# Patient Record
Sex: Male | Born: 1935 | Race: Asian | Hispanic: No | Smoking: Former smoker
Health system: Southern US, Community
[De-identification: ages and names within clinical notes are randomized; demographics above are authoritative.]

## PROBLEM LIST (undated history)

## (undated) ENCOUNTER — Emergency Department (HOSPITAL_COMMUNITY): Admission: EM | Payer: PRIVATE HEALTH INSURANCE | Source: Home / Self Care

## (undated) DIAGNOSIS — J449 Chronic obstructive pulmonary disease, unspecified: Secondary | ICD-10-CM

## (undated) DIAGNOSIS — E1140 Type 2 diabetes mellitus with diabetic neuropathy, unspecified: Secondary | ICD-10-CM

## (undated) DIAGNOSIS — I10 Essential (primary) hypertension: Secondary | ICD-10-CM

## (undated) DIAGNOSIS — N400 Enlarged prostate without lower urinary tract symptoms: Secondary | ICD-10-CM

## (undated) DIAGNOSIS — N3080 Other cystitis without hematuria: Secondary | ICD-10-CM

## (undated) DIAGNOSIS — E785 Hyperlipidemia, unspecified: Secondary | ICD-10-CM

## (undated) DIAGNOSIS — R0601 Orthopnea: Secondary | ICD-10-CM

## (undated) HISTORY — PX: NO PAST SURGERIES: SHX2092

## (undated) HISTORY — DX: Hyperlipidemia, unspecified: E78.5

## (undated) HISTORY — DX: Essential (primary) hypertension: I10

---

## 2006-08-06 ENCOUNTER — Inpatient Hospital Stay (HOSPITAL_COMMUNITY): Admission: EM | Admit: 2006-08-06 | Discharge: 2006-08-12 | Payer: Self-pay | Admitting: Emergency Medicine

## 2006-08-11 ENCOUNTER — Encounter: Payer: Self-pay | Admitting: *Deleted

## 2007-10-18 ENCOUNTER — Emergency Department (HOSPITAL_COMMUNITY): Admission: EM | Admit: 2007-10-18 | Discharge: 2007-10-18 | Payer: Self-pay | Admitting: Emergency Medicine

## 2007-10-20 ENCOUNTER — Ambulatory Visit: Payer: Self-pay | Admitting: Internal Medicine

## 2007-10-20 ENCOUNTER — Inpatient Hospital Stay (HOSPITAL_COMMUNITY): Admission: EM | Admit: 2007-10-20 | Discharge: 2007-10-22 | Payer: Self-pay | Admitting: Emergency Medicine

## 2007-10-21 LAB — CONVERTED CEMR LAB
HDL: 43 mg/dL
PSA: 0.72 ng/mL

## 2007-10-29 ENCOUNTER — Observation Stay (HOSPITAL_COMMUNITY): Admission: EM | Admit: 2007-10-29 | Discharge: 2007-10-30 | Payer: Self-pay | Admitting: Emergency Medicine

## 2007-10-29 ENCOUNTER — Ambulatory Visit: Payer: Self-pay | Admitting: Family Medicine

## 2007-11-01 ENCOUNTER — Ambulatory Visit: Payer: Self-pay | Admitting: Family Medicine

## 2007-11-01 ENCOUNTER — Encounter (INDEPENDENT_AMBULATORY_CARE_PROVIDER_SITE_OTHER): Payer: Self-pay | Admitting: *Deleted

## 2007-11-02 ENCOUNTER — Ambulatory Visit: Payer: Self-pay | Admitting: Family Medicine

## 2007-11-09 ENCOUNTER — Ambulatory Visit: Payer: Self-pay | Admitting: Family Medicine

## 2007-11-09 DIAGNOSIS — J4489 Other specified chronic obstructive pulmonary disease: Secondary | ICD-10-CM | POA: Insufficient documentation

## 2007-11-09 DIAGNOSIS — E0590 Thyrotoxicosis, unspecified without thyrotoxic crisis or storm: Secondary | ICD-10-CM | POA: Insufficient documentation

## 2007-11-09 DIAGNOSIS — G4700 Insomnia, unspecified: Secondary | ICD-10-CM | POA: Insufficient documentation

## 2007-11-09 DIAGNOSIS — J449 Chronic obstructive pulmonary disease, unspecified: Secondary | ICD-10-CM

## 2007-11-12 ENCOUNTER — Encounter: Payer: Self-pay | Admitting: Family Medicine

## 2007-11-12 ENCOUNTER — Observation Stay (HOSPITAL_COMMUNITY): Admission: EM | Admit: 2007-11-12 | Discharge: 2007-11-13 | Payer: Self-pay | Admitting: Emergency Medicine

## 2007-11-12 ENCOUNTER — Ambulatory Visit: Payer: Self-pay | Admitting: Family Medicine

## 2007-11-13 ENCOUNTER — Encounter (INDEPENDENT_AMBULATORY_CARE_PROVIDER_SITE_OTHER): Payer: Self-pay | Admitting: Family Medicine

## 2007-12-08 ENCOUNTER — Ambulatory Visit: Payer: Self-pay | Admitting: Family Medicine

## 2007-12-08 ENCOUNTER — Encounter (INDEPENDENT_AMBULATORY_CARE_PROVIDER_SITE_OTHER): Payer: Self-pay | Admitting: Family Medicine

## 2007-12-08 LAB — CONVERTED CEMR LAB

## 2007-12-14 ENCOUNTER — Telehealth (INDEPENDENT_AMBULATORY_CARE_PROVIDER_SITE_OTHER): Payer: Self-pay | Admitting: Family Medicine

## 2007-12-14 DIAGNOSIS — E119 Type 2 diabetes mellitus without complications: Secondary | ICD-10-CM

## 2007-12-14 LAB — CONVERTED CEMR LAB
CO2: 24 meq/L (ref 19–32)
Calcium: 8.3 mg/dL — ABNORMAL LOW (ref 8.4–10.5)
Creatinine, Ser: 0.62 mg/dL (ref 0.40–1.50)
Free T4: 1.69 ng/dL (ref 0.89–1.80)
Glucose, Bld: 181 mg/dL — ABNORMAL HIGH (ref 70–99)
TSH: 0.389 microintl units/mL (ref 0.350–5.50)
Total Bilirubin: 0.9 mg/dL (ref 0.3–1.2)

## 2008-01-10 ENCOUNTER — Encounter (INDEPENDENT_AMBULATORY_CARE_PROVIDER_SITE_OTHER): Payer: Self-pay | Admitting: Family Medicine

## 2008-01-10 ENCOUNTER — Ambulatory Visit: Payer: Self-pay | Admitting: Family Medicine

## 2008-01-10 DIAGNOSIS — I10 Essential (primary) hypertension: Secondary | ICD-10-CM

## 2008-01-10 LAB — CONVERTED CEMR LAB: Glucose, Bld: 230 mg/dL

## 2008-01-11 LAB — CONVERTED CEMR LAB
AST: 16 units/L (ref 0–37)
Albumin: 4.1 g/dL (ref 3.5–5.2)
Glucose, Bld: 179 mg/dL — ABNORMAL HIGH (ref 70–99)
Total Bilirubin: 0.3 mg/dL (ref 0.3–1.2)
Total Protein: 6.8 g/dL (ref 6.0–8.3)

## 2008-02-14 ENCOUNTER — Encounter: Admission: RE | Admit: 2008-02-14 | Discharge: 2008-05-14 | Payer: Self-pay | Admitting: Family Medicine

## 2008-02-22 ENCOUNTER — Encounter (INDEPENDENT_AMBULATORY_CARE_PROVIDER_SITE_OTHER): Payer: Self-pay | Admitting: Family Medicine

## 2008-02-28 ENCOUNTER — Encounter: Payer: Self-pay | Admitting: Family Medicine

## 2008-02-28 ENCOUNTER — Ambulatory Visit: Payer: Self-pay | Admitting: Family Medicine

## 2008-02-28 ENCOUNTER — Inpatient Hospital Stay (HOSPITAL_COMMUNITY): Admission: EM | Admit: 2008-02-28 | Discharge: 2008-03-01 | Payer: Self-pay | Admitting: Emergency Medicine

## 2008-03-07 ENCOUNTER — Encounter: Payer: Self-pay | Admitting: *Deleted

## 2008-03-07 ENCOUNTER — Ambulatory Visit: Payer: Self-pay | Admitting: Family Medicine

## 2008-09-20 ENCOUNTER — Telehealth: Payer: Self-pay | Admitting: *Deleted

## 2008-10-02 ENCOUNTER — Ambulatory Visit: Payer: Self-pay | Admitting: Family Medicine

## 2008-10-02 ENCOUNTER — Encounter (INDEPENDENT_AMBULATORY_CARE_PROVIDER_SITE_OTHER): Payer: Self-pay | Admitting: Family Medicine

## 2008-10-02 DIAGNOSIS — J984 Other disorders of lung: Secondary | ICD-10-CM | POA: Insufficient documentation

## 2008-10-04 ENCOUNTER — Encounter (INDEPENDENT_AMBULATORY_CARE_PROVIDER_SITE_OTHER): Payer: Self-pay | Admitting: Family Medicine

## 2008-10-04 LAB — CONVERTED CEMR LAB
ALT: 13 U/L
AST: 16 U/L
Albumin: 4.2 g/dL
Alkaline Phosphatase: 65 U/L
BUN: 24 mg/dL — ABNORMAL HIGH
CO2: 26 meq/L
Calcium: 9 mg/dL
Chloride: 103 meq/L
Creatinine, Ser: 0.87 mg/dL
Direct LDL: 92 mg/dL
Glucose, Bld: 94 mg/dL
Potassium: 4.8 meq/L
Sodium: 140 meq/L
Total Bilirubin: 0.3 mg/dL
Total Protein: 7.8 g/dL

## 2008-10-20 LAB — CONVERTED CEMR LAB: PSA: 0.72 ng/mL

## 2008-10-25 ENCOUNTER — Encounter: Admission: RE | Admit: 2008-10-25 | Discharge: 2008-10-25 | Payer: Self-pay | Admitting: Family Medicine

## 2008-11-27 ENCOUNTER — Encounter (INDEPENDENT_AMBULATORY_CARE_PROVIDER_SITE_OTHER): Payer: Self-pay | Admitting: Family Medicine

## 2009-03-05 ENCOUNTER — Emergency Department (HOSPITAL_COMMUNITY): Admission: EM | Admit: 2009-03-05 | Discharge: 2009-03-05 | Payer: Self-pay | Admitting: Emergency Medicine

## 2009-03-11 ENCOUNTER — Emergency Department (HOSPITAL_COMMUNITY): Admission: EM | Admit: 2009-03-11 | Discharge: 2009-03-12 | Payer: Self-pay | Admitting: Emergency Medicine

## 2009-03-14 ENCOUNTER — Emergency Department (HOSPITAL_COMMUNITY): Admission: EM | Admit: 2009-03-14 | Discharge: 2009-03-14 | Payer: Self-pay | Admitting: Emergency Medicine

## 2009-04-07 ENCOUNTER — Encounter: Payer: Self-pay | Admitting: Family Medicine

## 2009-04-07 ENCOUNTER — Ambulatory Visit: Payer: Self-pay | Admitting: Family Medicine

## 2009-04-07 ENCOUNTER — Inpatient Hospital Stay (HOSPITAL_COMMUNITY): Admission: EM | Admit: 2009-04-07 | Discharge: 2009-04-09 | Payer: Self-pay | Admitting: Emergency Medicine

## 2009-04-07 ENCOUNTER — Ambulatory Visit: Payer: Self-pay | Admitting: Vascular Surgery

## 2009-04-08 ENCOUNTER — Encounter: Payer: Self-pay | Admitting: Family Medicine

## 2009-04-09 LAB — CONVERTED CEMR LAB
HCT: 35.5 %
Hemoglobin: 11.7 g/dL
Platelets: 185 10*3/uL
WBC: 17.8 10*3/uL

## 2009-04-18 ENCOUNTER — Ambulatory Visit: Payer: Self-pay | Admitting: Family Medicine

## 2009-04-18 ENCOUNTER — Encounter: Payer: Self-pay | Admitting: Family Medicine

## 2009-04-18 LAB — CONVERTED CEMR LAB
BUN: 13 mg/dL
CO2: 20 meq/L
CO2: 24 meq/L (ref 19–32)
Calcium: 7.8 mg/dL
Calcium: 8.3 mg/dL — ABNORMAL LOW (ref 8.4–10.5)
Chloride: 106 meq/L
Chloride: 105 meq/L (ref 96–112)
Creatinine, Ser: 0.79 mg/dL
Glucose, Bld: 187 mg/dL
Sodium: 140 meq/L (ref 135–145)

## 2009-04-30 ENCOUNTER — Ambulatory Visit: Payer: Self-pay | Admitting: Family Medicine

## 2009-05-27 ENCOUNTER — Encounter: Payer: Self-pay | Admitting: Family Medicine

## 2009-05-27 ENCOUNTER — Ambulatory Visit: Payer: Self-pay | Admitting: Family Medicine

## 2009-05-27 ENCOUNTER — Inpatient Hospital Stay (HOSPITAL_COMMUNITY): Admission: EM | Admit: 2009-05-27 | Discharge: 2009-05-30 | Payer: Self-pay | Admitting: Emergency Medicine

## 2009-05-28 LAB — CONVERTED CEMR LAB
BUN: 9 mg/dL
Calcium: 7.9 mg/dL
Chloride: 106 meq/L
Glucose, Bld: 207 mg/dL
HCT: 35.5 %
Platelets: 167 10*3/uL
Potassium: 3.9 meq/L

## 2009-06-02 ENCOUNTER — Encounter: Payer: Self-pay | Admitting: Family Medicine

## 2009-06-02 ENCOUNTER — Ambulatory Visit: Payer: Self-pay | Admitting: Family Medicine

## 2009-06-02 ENCOUNTER — Encounter: Payer: Self-pay | Admitting: *Deleted

## 2010-06-26 ENCOUNTER — Encounter: Payer: Self-pay | Admitting: *Deleted

## 2010-07-06 ENCOUNTER — Encounter: Payer: Self-pay | Admitting: Family Medicine

## 2010-07-16 NOTE — Miscellaneous (Signed)
Summary: refill request  [facility] pharmacist from [facility] asked for Advair refill. I declined as pt has not been here in over a yr. asked him to tell pt to call today for an appt.Golden Circle RN  June 26, 2010 10:48 AM

## 2010-09-14 LAB — GLUCOSE, CAPILLARY
Glucose-Capillary: 100 mg/dL — ABNORMAL HIGH (ref 70–99)
Glucose-Capillary: 104 mg/dL — ABNORMAL HIGH (ref 70–99)
Glucose-Capillary: 117 mg/dL — ABNORMAL HIGH (ref 70–99)
Glucose-Capillary: 117 mg/dL — ABNORMAL HIGH (ref 70–99)
Glucose-Capillary: 194 mg/dL — ABNORMAL HIGH (ref 70–99)
Glucose-Capillary: 99 mg/dL (ref 70–99)

## 2010-09-15 LAB — DIFFERENTIAL
Eosinophils Absolute: 1.6 10*3/uL — ABNORMAL HIGH (ref 0.0–0.7)
Lymphocytes Relative: 18 % (ref 12–46)
Lymphs Abs: 1.9 10*3/uL (ref 0.7–4.0)
Monocytes Relative: 5 % (ref 3–12)
Neutrophils Relative %: 63 % (ref 43–77)

## 2010-09-15 LAB — CBC
HCT: 35.5 % — ABNORMAL LOW (ref 39.0–52.0)
Hemoglobin: 11.9 g/dL — ABNORMAL LOW (ref 13.0–17.0)
MCHC: 33.6 g/dL (ref 30.0–36.0)
MCV: 91.2 fL (ref 78.0–100.0)
Platelets: 167 10*3/uL (ref 150–400)
Platelets: 205 10*3/uL (ref 150–400)
RBC: 4.3 MIL/uL (ref 4.22–5.81)
RDW: 14.1 % (ref 11.5–15.5)
WBC: 10.8 10*3/uL — ABNORMAL HIGH (ref 4.0–10.5)

## 2010-09-15 LAB — BASIC METABOLIC PANEL
BUN: 9 mg/dL (ref 6–23)
CO2: 17 mEq/L — ABNORMAL LOW (ref 19–32)
Calcium: 7.9 mg/dL — ABNORMAL LOW (ref 8.4–10.5)
Glucose, Bld: 207 mg/dL — ABNORMAL HIGH (ref 70–99)
Potassium: 3.9 mEq/L (ref 3.5–5.1)
Sodium: 138 mEq/L (ref 135–145)

## 2010-09-15 LAB — URINE CULTURE
Colony Count: NO GROWTH
Culture: NO GROWTH

## 2010-09-15 LAB — POCT I-STAT 3, ART BLOOD GAS (G3+)
Bicarbonate: 27.3 mEq/L — ABNORMAL HIGH (ref 20.0–24.0)
O2 Saturation: 97 %
TCO2: 29 mmol/L (ref 0–100)
pCO2 arterial: 45 mmHg (ref 35.0–45.0)
pH, Arterial: 7.392 (ref 7.350–7.450)
pO2, Arterial: 94 mmHg (ref 80.0–100.0)

## 2010-09-15 LAB — POCT I-STAT, CHEM 8
BUN: 8 mg/dL (ref 6–23)
Chloride: 105 mEq/L (ref 96–112)
Creatinine, Ser: 0.6 mg/dL (ref 0.4–1.5)
Glucose, Bld: 128 mg/dL — ABNORMAL HIGH (ref 70–99)
Hemoglobin: 13.6 g/dL (ref 13.0–17.0)
Potassium: 3.4 mEq/L — ABNORMAL LOW (ref 3.5–5.1)
Sodium: 141 mEq/L (ref 135–145)

## 2010-09-15 LAB — URINALYSIS, ROUTINE W REFLEX MICROSCOPIC
Glucose, UA: NEGATIVE mg/dL
Ketones, ur: 15 mg/dL — AB
Protein, ur: NEGATIVE mg/dL
Urobilinogen, UA: 0.2 mg/dL (ref 0.0–1.0)

## 2010-09-15 LAB — GLUCOSE, CAPILLARY
Glucose-Capillary: 127 mg/dL — ABNORMAL HIGH (ref 70–99)
Glucose-Capillary: 298 mg/dL — ABNORMAL HIGH (ref 70–99)

## 2010-09-17 LAB — DIFFERENTIAL
Basophils Absolute: 0 K/uL (ref 0.0–0.1)
Basophils Absolute: 0 10*3/uL (ref 0.0–0.1)
Basophils Relative: 0 % (ref 0–1)
Basophils Relative: 1 % (ref 0–1)
Eosinophils Absolute: 0.5 K/uL (ref 0.0–0.7)
Eosinophils Relative: 5 % (ref 0–5)
Lymphocytes Relative: 10 % — ABNORMAL LOW (ref 12–46)
Lymphs Abs: 1 10*3/uL (ref 0.7–4.0)
Monocytes Absolute: 0.4 10*3/uL (ref 0.1–1.0)
Monocytes Absolute: 0.4 10*3/uL (ref 0.1–1.0)
Monocytes Relative: 4 % (ref 3–12)
Neutro Abs: 8.2 K/uL — ABNORMAL HIGH (ref 1.7–7.7)
Neutro Abs: 5.1 10*3/uL (ref 1.7–7.7)
Neutrophils Relative %: 81 % — ABNORMAL HIGH (ref 43–77)
Neutrophils Relative %: 56 % (ref 43–77)

## 2010-09-17 LAB — POCT I-STAT, CHEM 8
BUN: 18 mg/dL (ref 6–23)
Calcium, Ion: 1.1 mmol/L — ABNORMAL LOW (ref 1.12–1.32)
Creatinine, Ser: 0.7 mg/dL (ref 0.4–1.5)
Glucose, Bld: 165 mg/dL — ABNORMAL HIGH (ref 70–99)
Sodium: 141 mEq/L (ref 135–145)
TCO2: 27 mmol/L (ref 0–100)

## 2010-09-17 LAB — CBC
HCT: 37 % — ABNORMAL LOW (ref 39.0–52.0)
HCT: 42.8 % (ref 39.0–52.0)
Hemoglobin: 11.7 g/dL — ABNORMAL LOW (ref 13.0–17.0)
Hemoglobin: 12.4 g/dL — ABNORMAL LOW (ref 13.0–17.0)
Hemoglobin: 14.3 g/dL (ref 13.0–17.0)
MCHC: 33.3 g/dL (ref 30.0–36.0)
MCHC: 33.6 g/dL (ref 30.0–36.0)
MCHC: 33.2 g/dL (ref 30.0–36.0)
MCV: 90.2 fL (ref 78.0–100.0)
Platelets: 185 10*3/uL (ref 150–400)
Platelets: 191 K/uL (ref 150–400)
RBC: 4.11 MIL/uL — ABNORMAL LOW (ref 4.22–5.81)
RBC: 4.75 MIL/uL (ref 4.22–5.81)
RDW: 14.2 % (ref 11.5–15.5)
RDW: 14.3 % (ref 11.5–15.5)
RDW: 13.5 % (ref 11.5–15.5)
WBC: 10 10*3/uL (ref 4.0–10.5)

## 2010-09-17 LAB — GLUCOSE, CAPILLARY
Glucose-Capillary: 136 mg/dL — ABNORMAL HIGH (ref 70–99)
Glucose-Capillary: 155 mg/dL — ABNORMAL HIGH (ref 70–99)
Glucose-Capillary: 192 mg/dL — ABNORMAL HIGH (ref 70–99)
Glucose-Capillary: 60 mg/dL — ABNORMAL LOW (ref 70–99)

## 2010-09-17 LAB — BASIC METABOLIC PANEL
BUN: 13 mg/dL (ref 6–23)
BUN: 22 mg/dL (ref 6–23)
CO2: 25 mEq/L (ref 19–32)
CO2: 29 mEq/L (ref 19–32)
Calcium: 7.8 mg/dL — ABNORMAL LOW (ref 8.4–10.5)
Calcium: 8 mg/dL — ABNORMAL LOW (ref 8.4–10.5)
Calcium: 8.7 mg/dL (ref 8.4–10.5)
Chloride: 106 mEq/L (ref 96–112)
Chloride: 114 mEq/L — ABNORMAL HIGH (ref 96–112)
Creatinine, Ser: 0.85 mg/dL (ref 0.4–1.5)
GFR calc Af Amer: >60 mL/min (ref >60)
GFR calc non Af Amer: >60 mL/min (ref >60)
GFR calc non Af Amer: >60 mL/min (ref >60)
Glucose, Bld: 187 mg/dL — ABNORMAL HIGH (ref 70–99)
Glucose, Bld: 104 mg/dL — ABNORMAL HIGH (ref 70–99)
Potassium: 4.1 mEq/L (ref 3.5–5.1)
Potassium: 4.3 mEq/L (ref 3.5–5.1)
Sodium: 137 mEq/L (ref 135–145)
Sodium: 138 mEq/L (ref 135–145)
Sodium: 142 mEq/L (ref 135–145)

## 2010-09-17 LAB — POCT I-STAT 3, ART BLOOD GAS (G3+)
O2 Saturation: 100 %
TCO2: 27 mmol/L (ref 0–100)
pCO2 arterial: 50.7 mmHg — ABNORMAL HIGH (ref 35.0–45.0)

## 2010-09-17 LAB — BASIC METABOLIC PANEL WITH GFR
BUN: 14 mg/dL (ref 6–23)
CO2: 26 meq/L (ref 19–32)
Calcium: 8.6 mg/dL (ref 8.4–10.5)
Creatinine, Ser: 0.67 mg/dL (ref 0.4–1.5)
GFR calc Af Amer: >60 mL/min (ref >60)
Glucose, Bld: 174 mg/dL — ABNORMAL HIGH (ref 70–99)

## 2010-09-17 LAB — POCT CARDIAC MARKERS
CKMB, poc: <1 ng/mL — ABNORMAL LOW (ref 1.0–8.0)
Myoglobin, poc: 69.4 ng/mL (ref 12–200)

## 2010-09-17 LAB — HEMOGLOBIN A1C: Mean Plasma Glucose: 160 mg/dL

## 2010-09-18 LAB — DIFFERENTIAL
Basophils Absolute: 0 10*3/uL (ref 0.0–0.1)
Basophils Relative: 1 % (ref 0–1)
Eosinophils Absolute: 0.4 10*3/uL (ref 0.0–0.7)
Monocytes Absolute: 0.2 10*3/uL (ref 0.1–1.0)
Neutro Abs: 6.1 10*3/uL (ref 1.7–7.7)

## 2010-09-18 LAB — POCT I-STAT, CHEM 8
Calcium, Ion: 1.1 mmol/L — ABNORMAL LOW (ref 1.12–1.32)
Chloride: 103 mEq/L (ref 96–112)
HCT: 43 % (ref 39.0–52.0)
Hemoglobin: 14.6 g/dL (ref 13.0–17.0)
TCO2: 26 mmol/L (ref 0–100)

## 2010-09-18 LAB — CBC
Hemoglobin: 13.1 g/dL (ref 13.0–17.0)
MCHC: 33.1 g/dL (ref 30.0–36.0)
RDW: 13.3 % (ref 11.5–15.5)

## 2010-09-28 ENCOUNTER — Other Ambulatory Visit: Payer: Self-pay | Admitting: Family Medicine

## 2010-09-28 DIAGNOSIS — J449 Chronic obstructive pulmonary disease, unspecified: Secondary | ICD-10-CM

## 2010-09-28 MED ORDER — FLUTICASONE-SALMETEROL 500-50 MCG/DOSE IN AEPB: 1.0000 | INHALATION_SPRAY | Freq: Two times a day (BID) | RESPIRATORY_TRACT | Status: DC

## 2010-10-27 NOTE — Discharge Summary (Signed)
Nathaniel [name], Nathaniel [name] NO.:  [id number]   MEDICAL RECORD NO.:  [id number]          PATIENT TYPE:  OBV   LOCATION:  5120                         FACILITY:  [facility]   PHYSICIAN:  Zenaida Deed. Mayford Knife, M.D.DATE OF BIRTH:  06-May-1934   DATE OF ADMISSION:  11/12/2007  DATE OF DISCHARGE:  11/13/2007                               DISCHARGE SUMMARY   PRIMARY CARE [place]:  The patient's Saketh Daubert is Dr. Alanda Amass,  [facility].   REASON FOR HOSPITALIZATION:  COPD exacerbation.   DISCHARGE DIAGNOSIS:  Chronic obstructive pulmonary disease  exacerbation.   ADDITIONAL DIAGNOSES:  1. Longstanding chronic obstructive pulmonary disease.  2. History of stable pulmonary nodules, diagnosed on CT scan on Oct 20, 2007.  He needs a followup CT with contrast in 12 months.  3. Borderline diabetes.  4. History of frequent steroid use with chronic obstructive pulmonary      disease exacerbations.  5. History of the low TSH was normal T3 and T4  6. Normal PSA on Oct 21, 2007.  7. Transaminitis on this admission.   SIGNIFICANT FINDINGS:  1. Admission workup:  Admission CBC showed a mildly elevated white      count of 12.1 with 77% neutrophils.  Hemoglobin was 15.2 and      platelet counts was 203.  Complete metabolic panel on admission      showed sodium of 137, potassium 3.4, chloride 96, CO2 of 31,      glucose 161, BUN of 13, and creatinine 0.75.  Liver function tests      were entirely normal except for an ALT elevated to 503 and AST      elevated to 142.  Calcium was slightly low at 8.3.  The patient      does not have a history of elevated liver function tests in the      past.  BNP on admission was 30.  Chest x-ray performed on admission      showed hyperaeration and bronchitic changes, but was negative for      infiltrate.  2. Inpatient workup:  Coagulation studies done as an inpatient were      normal.  Hepatitis B surface antigen was negative.  Repeat  CMP on      the day of discharge showed normal electrolytes, glucose of 153 and      AST that had reduced to 57 and ALT was reduced to 320.  CBC with      differential showed a white blood cell count of 14.0, hemoglobin      12.6, and platelet count of 196.  Point-of-care blood sugars were      drawn during this admission and found to be elevated and low 200s      to low 300s.  The patient has a past hemoglobin A1c on admission      earlier on May 2009 that was 6.9.   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  The patient reported to the emergency department with increased cough  and shortness of breath for 2 days.  The patient was placed on  supplemental oxygen and was given IV steroids and albuterol and Atrovent  in the emergency department with improvement in his symptoms.  The  patient still had persistent wheeze and shortness of breath on initial  evaluation by family practice teaching service and therefore was  admitted for overnight observation and initiation of COPD exacerbation  therapy.  The patient was maintained on oral steroids, was given  albuterol and Atrovent nebulized, and started on doxycycline for  antibiotic coverage of community-acquired pneumonia.  The patient did  not require any p.r.n. nebulizer treatments and was oxygenating well on  room air on the date of discharge, so the patient was safe for discharge  to home with a long steroid taper.  Of note, steroids were discontinued  at fall by the patient's primary care Aleeah Greeno, Dr. Melynda Ripple given the  patient's reassuring clinical exam; however, given this admission, it  seems that the patient has likely developed some degree of steroid  dependency and therefore will need long steroid tapers in the future.  Also noted during this admission was elevated blood sugars.  The  patient's last hemoglobin A1c was 6.9 earlier in  May 2009.  Given this  finding, the patient is likely a diabetic.  We will therefore start the  patient on glipizide 5 mg daily for better control of blood sugars and  this should be assessed further as an outpatient.   MEDICATIONS:  1. Glipizide 5 mg p.o. daily.  2. Steroid taper 60 mg of prednisone daily for 1 week, then decrease      to 40 daily for 1 week, then decrease to 20 mg daily for 1 week,      then decrease to 10 mg daily for 1 week, then stop.  3. Advair 250/50 mcg 1 puff b.i.d.  4. Hycodan cough syrup 5/1.5 mg per teaspoon 1 teaspoon every 4-6      hours as needed for cough.  5. Doxycycline 100 mg twice daily for a 10-day course.  6. Spiriva 18 mcg capsule, inhale 1 capsule daily.  7. Albuterol 2 puffs inhale q.4 h. p.r.n.   DISCHARGE INSTRUCTIONS:  1. The patient is to take medications as mentioned previously with the      addition of glipizide and antibiotics.  The patient is to follow up      with Dr. Melynda Ripple in [facility] on December 08, 2007, at      8:30 a.m.  2. The patient is to take Advair for control of his asthma symptoms      along with Spiriva.  Albuterol is to used only for shortness of      breath.  This was discussed at length with the patient's daughter      who is expressed understanding and this is to assist the patient's      with medication administration.   PROCEDURES:  None.   CONSULTATIONS:  None   DISPOSITION:  The patient is discharged to home.   DISCHARGE CONDITION:  Stable, fair.   ISSUES FOR FOLLOWUP:  The patient's hemoglobin A1c should be rechecked  on the next followup appointment to assess glycemic control and further  augmentation of his antihyperglycemic regimen should be undertaken.  The  patient's use of Advair as control medication should also be assessed to  make sure that  the patient is taking this for  control of the symptoms.  The patient should be monitored for progress  on steroid taper.  We would recommend  using caution with truncation of  steroid taper in the future given the patient's exacerbation soon after  his last discharge on this admission.      Myrtie Soman, MD  Electronically Signed      Zenaida Deed. Mayford Knife, M.D.  Electronically Signed    TE/MEDQ  D:  11/13/2007  T:  11/14/2007  Job:  [id number]

## 2010-10-27 NOTE — Discharge Summary (Signed)
Nathaniel [name], Nathaniel [name] NO.:  [id number]   MEDICAL RECORD NO.:  [id number]          PATIENT TYPE:  INP   LOCATION:  3708                         FACILITY:  [facility]   PHYSICIAN:  Zenaida Deed. Mayford Knife, M.D.DATE OF BIRTH:  03/31/1934   DATE OF ADMISSION:  10/20/2007  DATE OF DISCHARGE:  10/22/2007                               DISCHARGE SUMMARY   REASON FOR ADMISSION:  Shortness of breath.   DISCHARGE DIAGNOSES:  1. Chronic obstructive pulmonary disease exacerbation.  2. Musculoskeletal chest pain.  3. History of pulmonary nodules.  4. Hyperglycemia possibly diet controlled diabetes.  5. History of tobacco abuse.  6. Low thyroid hormone with normal T3 and T4.   DISCHARGE INSTRUCTIONS:  The patient is to be in a low carbohydrate  diet.  No restrictions on activities.  He is to follow up with Dr. Melynda Ripple  at the [facility].  His daughter Tobey Grim would  call the appointment.  The appointment will be for Nov 09, 2007, at 3  o'clock.  This time was left on [employer] answering machine for the  patient.   DISCHARGE MEDICATIONS:  1. Spiriva 18 mcg take 1 puff daily.  2. Albuterol 90 mcg 1-2 puffs inhaled every 4 hours as needed p.r.n.  3. Guaifenesin 100 mg per 5 mL take 5 mL four times a day as needed      for cough.  4. Tussionex 5 mL by mouth every 12 hours as needed for cough.  5. Prednisone 60 mg 1 tablet by mouth daily x3 days.  6. Azithromycin 500 mg 1 tablet daily x3 days.  7. Tylenol 650 mg 1 tablet every 4 hours as needed for pain   INSTRUCTIONS:  Using albuterol more frequently than every 4 hours.  Please go to the Urgent Care with emergency department.  He will also  need a CT scan in 12 months to follow up for pulmonary nodules.  Try not  to eat sweet foods like cookies and cakes.  These were all present on  his discharge sheet.  He is to continue all his home medicine.  A  pneumonia vaccine was given prior to discharge according to the  nurse.   LABORATORY DATA:  TSH was low at 0.195, however, T4 was normal at 1.76  and T3 was normal at 92.4.  CBC at the time of discharge, white blood  cell count 6.9, hemoglobin 12.7, hematocrit 38.5, platelets 228.  PSA  was 0.72 and A1c was a little bit up at 6.9.  Cardiac enzymes had a  slight elevation of CK-MB to 4.4 and also a slight elevation of relative  index to 4.4, however, troponin and creatinine kinase remained normal.  Fasting lipid panel, cholesterol of 135, triglycerides 42, HDL 43, LDL  84.  Last basic metabolic panel, sodium 140, potassium 4.0, chloride  109, bicarb 26, glucose 138, BUN 17, creatinine 0.64, BNP was less than  30, D-dimer was high at 0.69.   STUDIES:  He had a CT angiogram of his chest that showed no CT evidence  for acute  pulmonary embolus, tiny penetrating ulcer or ulcerated plaque  in the proximal transverse aorta that are stable, mediastinal  lymphadenopathy seen on previous study resolved, stable bilateral  parenchymal lung nodules 1 year imaging stability is reassuring,  consider follow-up CT without contrast in 12 months to document 2 years  of imaging stability, hyperexpansion suggest emphysema.  Chest x-ray  showed no active disease but COPD.   DISCHARGE/DISPOSITION:  The patient will be discharged home.  His  daughter is here to pick him up.  His discharge condition is much  improved.  He is walking around without shortness of breath.  He is not  requiring oxygen while walking or while sleeping or while sitting   HOSPITAL COURSE:  The patient is a 77 year old [place] gentleman who  was admitted with a COPD exacerbation.  1. COPD exacerbation.  The patient was started on oxygen via nasal      cannula.  He was given albuterol nebs every 1 hour as needed      initially and Atrovent every 6 hours.  He was then transitioned to      nebs that were more spaced out.  She was also started on      azithromycin 500 mg intravenously at first and  then switched to      p.o. form.  He was also given Solu-Medrol at Urgent Care.  He was      continued on this and then switched over to p.o. prednisone.  At      the time of discharge, he is sating greater than 90% on room air.      He is also able to walk down the hall without desaturating below      90s without oxygen.  He is only needing his nebs at most every 4      hours.  He will be discharged home with prescriptions for the rest      of his prednisone, azithromycin, as well as prescriptions for      Spiriva and albuterol.  He was also started on Spiriva prior to      leaving and this inhaler will be given to him prior to his      discharge.  2. Chest pain.  Cardiac enzymes showed a minimal elevation of CK-MB, a      slight relative index elevation.  The pain seemed very much      coincide with his coughing and when we pressed on his chest.  His      troponins remain negative.  He was given Tylenol p.r.n. pain was      felt that this is mostly musculoskeletal in nature.  3. Elevated blood pressure.  The patient's blood pressure was      initially elevated, however, it was completely down to normal at      the time of discharge after his breathing had improved.  Discharge      blood pressures were in the 130s over 70s to 80s.  He did not need      anything for his blood pressure here in the hospital.  4. History of pulmonary nodules.  The patient's CT here showed stable      pulmonary nodules.  He will need another CT in 12 months to follow      up to make sure that there is 2 year stability here.  He does have      a history of tobacco abuse, however, he has not been smoking for  the past at least a year.  5. History of elevated CBGs with steroids.  The patient's CBGs on the      day of discharge were 168 and 131.  His A1c was 6.9 which makes me      think that he has some component of diabetes that is diet      controlled.  He was given  instructions by the nurse on low-       carbohydrate diet and I did discuss this with him and as well as      his daughter.  6. Dysphagia.  The patient was having problems swallowing solids and      beverages initially however, this was much improved.  He was able      to tolerate a normal diet during his hospitalization after his      breathing normalized.  7. Anemia.  The patient's hemoglobin was initially normal at 14.1.      This is likely dilutional since he was on normal saline and also      probably due to phlebotomy.  Would consult this up as an      outpatient.  8. Decreased TSH.  The patient has a history of having decreased TSH      with a normal T3 and T4 in the past.  We did add on T3 and T4 to      his labs and these were normal as well.  We can discontinue to      follow this.  9. Health care continuity.  The patient will be separate from [facility] as he does not have a normal PCP.  His      daughter was called with the appointment time and the answering      message was left on her answer machine.      Alanda Amass, M.D.  Electronically Signed      Zenaida Deed. Mayford Knife, M.D.  Electronically Signed    JH/MEDQ  D:  10/23/2007  T:  10/23/2007  Job:  [id number]

## 2010-10-27 NOTE — H&P (Signed)
NAMEEDI, GORNIAK NO.:  [id number]   MEDICAL RECORD NO.:  [id number]           PATIENT TYPE:   LOCATION:                                 FACILITY:   PHYSICIAN:  Pearlean Brownie, M.D.DATE OF BIRTH:  12-Jun-1934   DATE OF ADMISSION:  DATE OF DISCHARGE:                              HISTORY & PHYSICAL   PRIMARY CARE PHYSICIAN:  The patient is unassigned and plans to follow  with Dr. Alanda Amass at [facility] later in  the month.   CHIEF COMPLAINT:  Chest pain and shortness of breath.   HISTORY OF PRESENT ILLNESS:  This is a 77 year old [place] male with  a history of COPD.  He was admitted for an exacerbation in July 2008.  He was again admitted last week for this same COPD exacerbation type  symptoms and was released from the hospital 7 days ago.  He was sent  home with 3 more days than of azithromycin and prednisone.  He reports  that he has been using his Spiriva inhaler once daily as well as  albuterol inhaler 4 times daily.  He came to the emergency room today  for shortness of breath and cough as well as chest pain that has been  getting progressively worse over the past 4 days.  When he presented in  the emergency room he was in respiratory distress with a respiratory  rate in the upper 20s and 30s.  He received 3 albuterol and Atrovent  nebulizers as well as Solu-Medrol 125 mg, and now he reports he is  feeling much better.  His daughter said that he never has gotten back to  his baseline, after his recent hospital admission last week.   REVIEW OF SYSTEMS:  Negative for fever.  His review of systems is  positive for a dry a chronic cough, decreased appetite and chest pain  with cough.   PAST MEDICAL HISTORY:  1. COPD.  2. History of stable pulmonary nodules.  3. Hyper glycemia with steroid use.  4. Subclinical hypothyroidism.   MEDICATIONS:  1. Albuterol 1-2 puffs inhaled every 4 hours as needed.  2. Spiriva 1  puff daily.  3. Tussionex.  4. Guaifenesin.  5. Note that the patient finished prednisone and azithromycin in just      4 days ago for a COPD exacerbation.   SOCIAL HISTORY:  The patient is of [place] descent.  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  He is a widowed and has  another son who lives in [place].  He tried most recently traveled to  [place] in the year 2007, and quit smoking 2 years ago.   PHYSICAL EXAMINATION:  VITAL SIGNS:  Temperature 98.0, blood pressure  157/107, pulse 113, respirations 18 and was satting 90% on room air.  GENERAL:  The patient is [place]  He is very pleasant and smiling.  He is in no acute distress.  He does appear cachectic on exam.  HEENT::  The patient has upper dentures in place and poor lower  dentition.  Mucous membranes are moist.  NECK:  Supple without lymphadenopathy and no thyromegaly.  CARDIOVASCULAR:  He is tachycardiac with regular rate and rhythm and no  murmur.  LUNGS:  He has normal respiratory effort.  He is speaking complete  sentences.  He has a few scattered expiratory wheezes.  ABDOMEN:  Thin, soft, nontender and no masses are palpated.  EXTREMITIES: Reveal no edema.   LABORATORY DATA:  Labs or significant for a comprehensive metabolic  panel that is completely within normal limits and an ABG of  7.4/35/90/53.  A chest x-ray on admission showed no acute abnormalities.   ASSESSMENT:  This is a 77 year old [place] male with a history of  chronic obstructive pulmonary disease.  He is in the emergency room  today after a hospitalization lat week for chronic obstructive pulmonary  disease exacerbation.  He presented with wheezes and respiratory  distress that is now much improved after albuterol and Atrovent  nebulizers x3 as well as a dose of Solu-Medrol IV.   PLAN:  1. Dyspnea.  The patient had a positive D-dimer his last admission a      week ago for the same exact symptoms.  A chest CT was done  last      week that was negative for pulmonary embolism and revealed stable      pulmonary nodules.  Otherwise, his chest CT was normal.  For now we      will go ahead and treat the patient as if he has a chronic      obstructive pulmonary disease exacerbation with albuterol and      Atrovent nebulizers every 4 hours as well as prednisone 40 mg by      mouth once daily.  Of note he did complete a course of      azithromycin, so I do not think antibiotic are deem necessary.  2. Cough.  This is chronic and dry according to his daughter, this has      not improved with guaifenesin and Tussionex at home.  At home      although the daughter says that it was helping when he was in the      hospital.  Given his chronic cough and his cachexia, we will place      a PPD tuberculosis skin test.  3. Cachexia.  We will place the  PPD as outlined above.  His prostate      antigen was normal.  His last admission and he had a PET scan in      February 2008 that was normal from his skull base to a thighs.  4. Hypertension:  He was hypertensive in the emergency room but his      blood pressure normalized at his last admission.  We will watch his      blood pressure for now.  5. Chest pain this is likely pleuritic secondary to cough.  I will      cycle cardiac enzymes times one set.  His EKG on admission is      normal.  I will go ahead and scheduled Motrin 600 mg every 6 hours      for anti-inflammatory effect.      Sylvan Cheese, M.D.  Electronically Signed      Pearlean Brownie, M.D.  Electronically Signed    MJ/MEDQ  D:  10/29/2007  T:  10/30/2007  Job:  [id number]

## 2010-10-27 NOTE — H&P (Signed)
NAMEZYRELL, CARMEAN NO.:  [id number]   MEDICAL RECORD NO.:  [id number]          PATIENT TYPE:  INP   LOCATION:  2908                         FACILITY:  [facility]   PHYSICIAN:  Leighton Roach McDiarmid, M.D.DATE OF BIRTH:  12-May-1934   DATE OF ADMISSION:  02/28/2008  DATE OF DISCHARGE:                              HISTORY & PHYSICAL   PRIMARY CARE Eldonna Neuenfeldt:  Alanda Amass, MD, at [facility].   CHIEF COMPLAINT:  Cough and difficulty breathing.   HISTORY OF PRESENT ILLNESS:  The patient is a 77 year old male with a  history of COPD, diabetes, and hypertension, who presented to the  emergency department today with a 1-day history of increasing  nonproductive cough and difficulty breathing.  He had been having more  difficulty breathing over the past week particularly at night and after  work, and this afternoon after work, his cough and respiratory distress  acutely worsened, so he came to the emergency department.   REVIEW OF SYSTEMS:  Positive for some chest pain after coughing;  however, the patient reports he only has the chest pain when he coughs.  He has some bilateral lower extremity swelling.  His review of systems  is negative for fever, sick contacts, rhinorrhea, or calf pain.  Of  note, the patient speaks [place] and his son-in-law interpreted for  [place] during this interview.   PAST MEDICAL HISTORY:  1. COPD.  2. History of pulmonary nodules on CT done in Oct 20, 2007.  The      patient needs follow up CT with contrast in 12 months.  3. Diabetes.  4. Subclinical hypothyroidism.  The patient had a low TSH with a      normal T3 and T4 found during a hospitalization in June 2009.   PHYSICAL EXAMINATION:  VITAL SIGNS:  Temperature 98.6, pulse 116,  respirations 28 per minute, blood pressure 171/108, oxygen saturation  100% on non-rebreather.  GENERAL:  The patient is well-developed, well-nourished thin gentleman,  in moderate distress on a  non-rebreather mask.  He is alert and  appropriate and cooperative throughout the exam.  HEAD:  No obvious abnormalities.  EYES:  No corneal or conjunctival inflammation.  Pupils are equally  round and reactive to light.  EARS:  Tympanic membranes are clear.  MOUTH AND OROPHARYNX:  No erythema or exudate.  NECK:  Supple.  No lymphadenopathy or tenderness noted.  LUNGS:  The patient does have neck and abdominal intercostal  retractions.  He has coarse breath sounds throughout his lung fields.  He has a prolonged expiratory phase, significant expiratory wheezes.  No  consolidation appreciated and he does have moderately decreased air  movement.  HEART:  Tachycardiac, regular rhythm.  No murmurs.  ABDOMEN:  He does have positive bowel sounds throughout.  Abdomen is  soft and nontender without masses.  EXTREMITIES:  He has 1+ pedal edema bilaterally, but no calf swelling or  calf tenderness.  Negative Homans' sign.  He does have 2 small scabs on  his right shin.   LABS AND STUDIES:  BMET:  Sodium 138, potassium 4.4, chloride 106,  bicarb 25, BUN 13, creatinine 0.67, and glucose 103.  T bili 0.8, alk  phos 54, AST 23, ALT 14, and total protein 7.5.  BNP is 42.  I-STAT  cardiac enzymes were negative, i-STAT ABG showed a pH of 7.345, pCO2 of  51.2, paO2 of 335, bicarb of 28, 100% saturation that was done on O2,  albumin 4.2, and calcium 8.2.  Alcohol level less than 5.  PT 13.2, INR  1.0, and PTT 31.  D-dimer is elevated at 0.73.  White blood cell count  9.3, hemoglobin 14.3, hematocrit 42.6, platelets 194, and neutrophils  64%.  Chest x-ray showed chronic lung changes consistent with COPD with  some biapical and right mid lung scarring changes.  No definite acute  process.  EKG was very difficult to interpret as there was a  considerable amount of artifact.  I could not appreciate any significant  ST segment changes.   ASSESSMENT AND PLAN:  Problem #1.  Respiratory distress.  This is   presumed secondary to chronic obstructive pulmonary disease  exacerbation.  His ABG does not show acidosis; however, he does show  significant increased work of breathing.  We will treat this as a  chronic obstructive pulmonary disease exacerbation.  We will start  prednisone.  We will give albuterol and Atrovent nebs, and start Avelox.  We will support with supplemental oxygen and titrated to keep sats  between 88% and 92%, is currently on non-rebreather.  We will consider  CPAP if his respiratory status worsens.  His D-dimer was elevated;  however, the patient has a Wells criteria of only 1.5.  He does not have  any signs of a deep vein thrombosis with no calf pain or palpable cords.  Negative Homans sign, and he does have another reason for his shortness  of breath, and his chest pain is only associated with coughing.  Therefore, I do not want to expose him to a CT angio.  At this time, if  his respiratory status does not improve with the above list of  treatment, then we would consider a CT angio or if it acutely worsens,  but do not feel that there is a high enough suspicion for pulmonary  embolus at this time.   Problem #2.  Essential hypertension.  We will continue the patient's  home medicines of lisinopril 10.   Problem #3.  Diabetes.  His admission glucose was in the low 100s.  We  will continue on his home meds of glipizide 5 mg; however, we will hold  it until we make sure that the patient is eating.   Problem #4.  Hypothyroidism, subclinical.  I do not want to do any  testing at this time while the patient is acutely ill, further workup  can be done as an outpatient.   Problem #5.  Fluid, electrolytes, and nutrition.  The patient has a  carbon-modified diet.  We will give him normal saline at a 100 mg an  hour for now until he is able to take p.o.  Right now, the patient  cannot take adequate p.o. because he has a non-rebreather mask on.  We  will continue stopping the  fluids with his respiratory status improves.      Asher Muir, MD  Electronically Signed      Leighton Roach McDiarmid, M.D.  Electronically Signed    SO/MEDQ  D:  02/29/2008  T:  02/29/2008  Job:  [id number]

## 2010-10-27 NOTE — H&P (Signed)
Nathaniel [name], Nathaniel [name] NO.:  [id number]   MEDICAL RECORD NO.:  [id number]          PATIENT TYPE:  INP   LOCATION:  3708                         FACILITY:  [facility]   PHYSICIAN:  Zenaida Deed. Mayford Knife, M.D.DATE OF BIRTH:  July 29, 1933   DATE OF ADMISSION:  10/20/2007  DATE OF DISCHARGE:                              HISTORY & PHYSICAL   CHIEF COMPLAINT:  Shortness of breath.   HISTORY OF PRESENT ILLNESS:  The patient is a 77 year old [place] man  with a history of tobacco use, COPD, and questionable pulmonary nodules  on CT.  In February 2008, he presents with 4 days of worsening shortness  of breath.  He was seen in the emergency department on Oct 18, 2007 and  was diagnosed with bronchitis and given albuterol HFA inhaler and sent  home.  Last night, his shortness of breath did not respond to his  inhaler and subsequently he went to Urgent Care this morning where he  was tachycardic to 110, hypertensive at 181/112, mildly hypoxic at 91%  on room air, and had obvious increased work of breathing.  The patient  arrived at ED via EMS.  Per the patient's son, he served as his  interpreter, he states his breathing has somewhat improved with oxygen  but he is still having increased work of breathing.  Per report, the  patient has had chronic nightly cough for months, which is nonproductive  and has been without fever.  The patient complains of chest pain with  cough over his left anterior chest wall, there is no radiation.  The  patient denies shortness of breath with ambulation or when climbing  stairs at his baseline.  Please note, it is difficult to obtain a  history due to his language barrier.   PAST MEDICAL HISTORY:  Significant for COPD and a history of tobacco  abuse.   ALLERGIES:  No known drug allergies.   MEDICATIONS:  The patient takes 2 puffs on his albuterol HFA inhaler as  needed.   SOCIAL HISTORY:  The patient lives with his son.  He is widowed.  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  The patient stopped  smoking after his hospitalization 1 year ago, and was smoking a sort of  a pack per day prior to that for years.  The patient denies alcohol and  drugs.   FAMILY HISTORY:  Per patient report, his mother and father died of old  age and had no health problems.  His brother currently has COPD.   REVIEW OF SYSTEMS:  The patient denies fevers, chills, headache,  vomiting, diarrhea, hemoptysis, melena, abdominal pain, dysuria, rash,  or swelling of his extremities.  Review of systems is positive for  decreased appetite, chest pain over the left anterior chest wall,  orthopnea, palpitations, cough, dyspnea, wheezing, and dysphagia with  food and carbonated beverages.  The patient denies weight change and per  the family they state weight is stable.   PHYSICAL EXAMINATION:  VITAL SIGNS:  Temp is 97.9, pulse is 95,  respirations 20, blood pressure  171/113, and pulse ox is 100% on 3  liters.  GENERAL:  The patient is cachectic, ill-appearing man in obvious  respiratory distress.  HEENT:  The patient is normocephalic and atraumatic.  Pupils are equal,  round, and reactive to light.  Nasal cannula is in place.  The patient  has moist mucous membranes and dentures.  NECK:  Supple with no lymphadenopathy.  CARDIOVASCULAR:  The patient has regular S1 and S2, which is difficult  to hear over lung sounds.  LUNGS:  The patient has diffuse inspiratory and expiratory wheezes with  poor air movement with supraclavicular and subcostal retractions.  ABDOMEN:  Soft, nontender, and nontender with positive bowel sounds.  The patient is doing abdominal breathing.  BACK:  Nontender, but has a prominent spine.  GU:  Deferred.  RECTAL:  Deferred.  EXTREMITIES:  There is no clubbing, cyanosis, or edema.  NEUROLOGICAL:  Cranial nerves II through XII are grossly intact.  Gait  was untested due to the patient's respiratory distress, but the  patient  is moving all extremities equally.   LABORATORY STUDIES:  WBC 10.9, hemoglobin 13.9, hematocrit 41.3,  platelets 136.  He does have 93% neutrophils and 6% lymphocytes.  Sodium  is 141, potassium is 4.2, chloride 106, bicarb is 25, BUN 22, creatinine  0.75, glucose 164, total bili 0.8, alk phos 57, AST 21, ALT 14, total  protein 7.4, albumin 4.1, and calcium 8.5.  CK is 137, MB is 4.2,  relative index is 3.1, and troponin 0.02.  EKG shows normal sinus rhythm  with an incomplete right bundle-branch block.  Portable chest x-ray  showed no active disease and PA and lateral of the chest showed COPD  with no active disease.   ASSESSMENT/PLAN:  This is a 77 year old male with:  1. Shortness of breath.  This is likely a chronic obstructive      pulmonary disease exacerbation.  We will maintain him on oxygen via      nasal cannula to keep sats greater than 91%.  We will start him on      albuterol nebs every 1 hour as needed.  We will also start Atrovent      nebs every 6 hours.  We will start azithromycin 500 mg intravenous      daily in the setting of his worsening cough.  He was given Solu-      Medrol 125 at Urgent Care.  We will continue this and convert him      to orally as able.  2. Chest pain.  EKG is unremarkable.  Likely musculoskeletal secondary      to his chronic cough.  We will check cardiac enzymes x3 sets and      give him Tylenol as needed for pain.  3. Elevated blood pressure.  The patient's blood pressure was also      elevated on Oct 18, 2007, in the emergency department ranging from      159-174 systolic over 85-98 diastolic.  Last year, however, his      blood pressure was 118/68 during his hospitalization in February      2008.  As the patient is without regular medical care and unclear      as to the chronicity of his problem, I will not treat this right      now, but we will wait and see if blood pressure is decreased once      his breathing improves.  4.  History of pulmonary nodules.  PET scan was negative in 2008, but      the patient is very cachectic appearing.  It is important to      consider a CT followup or other malignancy workup.  I will leave      this decision to the inpatient team.  5. History of elevated CBGs and steroids.  This will need to be      followed closely and the patient may need sliding scale insulin if      CBGs remain high.  6. Health maintenance.  The patient is without a primary doctor, so we      will check a fasting lipid panel in the morning.  At the time of      discharge, he will need a medical doctor and routine preventative      care.  7. Code status.  The patient is a full code.  8. Dysphagia.  The patient reports a problem with eating solids and      carbonated beverages.  He feels like he is      choking and he is sick.  He may need a speech consult once his      breathing has improved.  9. FEN/GI .  The patient will remain nothing by mouth while on      respiratory distress.  He will be maintained on half-normal saline      at 100 mL per hour.  His diet will be liberalized once he is able      to tolerate this.      Neena Rhymes, M.D.  Electronically Signed      Zenaida Deed. Mayford Knife, M.D.  Electronically Signed    KT/MEDQ  D:  10/20/2007  T:  10/21/2007  Job:  [id number]

## 2010-10-27 NOTE — Discharge Summary (Signed)
Nathaniel [name], Nathaniel [name] NO.:  [id number]   MEDICAL RECORD NO.:  [id number]          PATIENT TYPE:  INP   LOCATION:  5506                         FACILITY:  [facility]   PHYSICIAN:  Pearlean Brownie, M.D.DATE OF BIRTH:  1933-12-15   DATE OF ADMISSION:  10/29/2007  DATE OF DISCHARGE:  10/30/2007                               DISCHARGE SUMMARY   PRIMARY CARE Carmelle Bamberg:  Undefined.   CONSULTANTS:  None.   PROCEDURES:  None.   REASON FOR ADMISSION:  The patient is a 77 year old with a history of  COPD who was recently discharged from our service 7 days ago for a COPD  exacerbation.  He was sent home with 3 additional days of azithromycin  and prednisone which he reported that he took.  Since that time, he has  also been using Spiriva and albuterol 4 times a day; however, shortness  of breath and cough was getting worse progressively for the 4 days prior  to admission and by the time he arrived in the ED, he was in respiratory  distress, but his respiratory distress was quickly relieved with  albuterol/Atrovent nebulizer treatments x3 and a dose of Solu-Medrol.   DISCHARGE DIAGNOSIS:  Chronic obstructive pulmonary disease  exacerbation.   SECONDARY DIAGNOSES:  1. History of pulmonary nodules which were stable per computed      tomography on his last admission.  2. Hyperglycemia.   LABS AND STUDIES:  On admission, his point of care electrolytes were  largely within normal limits with the exception of a glucose of 112.  His creatinine was 0.7.  He had a set of point of care cardiac enzymes  which were all within normal limits.  AST and ALT were also normal.  Chest x-ray done showed no abnormalities.  An ABG done showed a pH of  7.45, a PCO2 of 35.9, a PO2 of 90, bicarbonate 25.3, and oxygen  saturation 97%.  That was an arterial sample.  Preliminary blood  cultures showed no growth to date.  A CBC the morning of discharge  showed a white blood cell count of 9.1,  hemoglobin of 13.0, hematocrit  of 39.4, and platelets were slightly low at 148.   DISCHARGE MEDICATIONS:  The patient was discharged home on a steroid  taper, which included:  1. Prednisone 40 mg p.o. daily for 5 days, prednisone 30 mg p.o. daily      for 5 days, prednisone 20 mg p.o. daily for 5 days, and prednisone      10 mg p.o. daily for 5 days.  Prescription was given.  2. The patient was told to use his albuterol MDI 2 puffs.  3. Spiriva 18 mcg 1 cap inhaled daily.  4. Tussionex over-the-counter as previously prescribed.  5. Guaifenesin over-the-counter as previously prescribed.  6. The patient was also told he can take over-the-counter ibuprofen as      directed on the bottle for pain.   HOSPITAL COURSE BY PROBLEM:  1. Dyspnea and chronic obstructive pulmonary disease exacerbation.  As      noted earlier, the patient was  here last week.  At that time, he      had a positive D-dimer and similar symptoms, so a chest CT was done      that showed no pulmonary embolism and it also showed that the      pulmonary nodules he has been diagnosed with were stable, so we      treated him as having a COPD exacerbation.  On this admission, he      was given albuterol and Atrovent nebs and started on prednisone at      40 mg and will be tapered off that on discharge.  The patient has      just completed a course of azithromycin and we did not think      antibiotics were called for at this time.  2. Cough.  The patient's daughter reported that he does have a chronic      dry cough that he has had for quite sometime.  He also, on exam,      was rather cachectic.  A PPD was placed on his left arm the evening      of May 18.  The patient has been instructed to come and have that      test read on the afternoon of May 20 or the morning of May 21 at      [facility].  3. Cachexia.  As mentioned previously, the patient has stable      pulmonary nodules according to serial CTs.  He also  had a normal      PET scan in February 2008.  He also had his PSA tested at his last      admission which was within normal limits and as stated previously,      we will check a PPD.  4. Hypertension.  The patient had normal blood pressures during his      hospital stay on no medications.  5. Chest pain.  The patient was endorsing some chest pain, but he is      stating it is only when he is coughing.  He had negative cardiac      enzymes in the emergency department.  His EKG was normal, so he can      take ibuprofen for his chest pain as needed on discharge.   PENDING TEST RESULTS AT TIME OF DISCHARGE:  None.   PATIENT'S CONDITION AT TIME OF DISCHARGE:  Stable.   DISPOSITION:  The patient is discharged to home.   DISCHARGE FOLLOWUP:  The patient is to follow up with Dr. Alanda Amass  at [facility] on May 28 at 3:00 p.m.  He is also to go  to the [facility] on Wednesday afternoon, May 20 or Thursday  morning, May 21 and have his TB test read.   FOLLOWUP ISSUES:  1. The patient has been somewhat hyperglycemic during his stay with      glucose ranging from 112-148.  This can be followed up on an      outpatient basis as he might have type 2 diabetes.  Pulmonary      nodules just needs to continuing monitoring.  He is to have a CT      again in a year to ensure that those nodules were stable.  2. COPD.  It is not clear why the patient has had such a sudden onset      of his COPD exacerbation and this needs continuing monitoring.  Asher Muir, MD  Electronically Signed      Pearlean Brownie, M.D.  Electronically Signed    SO/MEDQ  D:  10/30/2007  T:  10/31/2007  Job:  [id number]   cc:   Alanda Amass, M.D.

## 2010-10-27 NOTE — Discharge Summary (Signed)
Nathaniel [name], MATTIX NO.:  [id number]   MEDICAL RECORD NO.:  [id number]          PATIENT TYPE:  INP   LOCATION:  5501                         FACILITY:  [facility]   PHYSICIAN:  Paula Compton, MD        DATE OF BIRTH:  Jan 31, 1934   DATE OF ADMISSION:  02/28/2008  DATE OF DISCHARGE:  03/01/2008                               DISCHARGE SUMMARY   DISCHARGE DIAGNOSES:  1. Chronic obstructive pulmonary disease exacerbation.  2. Hypertension.  3. Diabetes mellitus.  4. Subclinical hyperthyroidism.   CONSULT:  None.   PROCEDURES AND STUDIES:  1. Chest x-ray impression:  Chronic lung changes consistent with      chronic obstructive pulmonary disease exacerbation, biapical and      right mid lung scarring changes.  No definite acute process.  2. EKG on February 28, 2008, normal sinus rhythm.  No significant ST      depression or elevation.  EKG was very difficult to read as there      was a lot of artifact.  3. EKG on February 29, 2008, normal sinus rhythm, possible incomplete      right bundle-branch block, left axis deviation.   DISCHARGE LABORATORIES:  Sodium 135, potassium 4.2, chloride 104, CO2 of  24, BUN 12, creatinine 0.70, glucose 241, and calcium 7.9.  Total bili  0.6, alk phos 55, total protein 6.2, AST 22, ALT 14, and albumin 3.4.   CBC:  White count 5.7, hemoglobin 12.7, hematocrit 39.3, and platelets  172.  UDS negative.   BRIEF HISTORY AND PHYSICAL:  A 77 year old male with a history of COPD,  diabetes, and hypertension presented with acute respiratory distress  with increasing nonproductive cough.  It is, of note, that the patient  works at a [employer], technical sales and status post work had increased cough and  respiratory distress prior to presentation to  ED.   HOSPITAL COURSE:  COPD.  Upon admission, the patient had increased work  of breathing and respiratory distress along with retractions and  bilateral coarse breath sounds and significant expiratory and  inspiratory wheezes.  ABG did not show acidosis; however, the patient  was given Solu-Medrol and then started on prednisone p.o., as well as  scheduled albuterol and Atrovent nebulizers.  The patient was also given  Avelox 400 mg daily x2 doses prior to discharge.  Supplemental O2 was  given to keep sats between 88% and 92%.  Prior to discharge, the patient  was ambulatory with O2 saturation of 96% on room air.  The patient was  initially sent to ICU for concern of respiratory distress and tiring out  would indicate intubation; however, the patient recovered well with  nebulizer treatments and was subsequently transferred to a regular  hospital floor.  Other differentials for acute respiratory distress did  include pulmonary embolism.  The patient had an elevated D-dimer at  0.73.  However, clinically had no signs of DVT on exam.  There was a low  probability for DVT according to [facility] criteria which is __________.  The patient's respiratory status improved  over hospital course.  __________.    DICTATION ENDED AT THIS POINT.      Milinda Antis, MD       Paula Compton, MD     KD/MEDQ  D:  03/01/2008  T:  03/02/2008  Job:  [id number]

## 2010-10-27 NOTE — H&P (Signed)
NAMEELKIN, [name] NO.:  [id number]   MEDICAL RECORD NO.:  [id number]          PATIENT TYPE:  INP   LOCATION:  5120                         FACILITY:  [facility]   PHYSICIAN:  Nestor Ramp, MD        DATE OF BIRTH:  Mar 30, 1934   DATE OF ADMISSION:  11/12/2007  DATE OF DISCHARGE:                              HISTORY & PHYSICAL   REASON FOR HOSPITALIZATION:  COPD exacerbation.   HISTORY OF PRESENT ILLNESS:  Please note that the history is obtained  via telephone interpreter.  The patient is a 77 year old Nathaniel [name]  patient recently discharged from the hospital on Oct 30, 2007, with COPD  exacerbation who presented to the emergency department from home with  worsening cough and shortness of breath.  The patient states that he  began to have worsening cough yesterday productive of whitish sputum.  This morning, the cough continued and the patient became more short of  breath so he presented to the emergency department.  O2 saturations were  initially 84%.  IV steroids and albuterol and Atrovent nebulizers were  started in the emergency department and 1 L IV fluid bolus was given as  well.  The patient states that he felt some better now but still has  pain with coughing.  The patient reports chest pain and abdominal pain  with coughing.  He has no pain when not coughing.  Chest pain does not  radiate to the jaw or the shoulder.  States that he felt feverish  yesterday but did not take his temperature.  Denies any current chills  or sweats.  Denies any nausea, vomiting or diarrhea.  The patient states  that he has been able to tolerate oral solids and liquids, although his  appetite has been somewhat decreased over the past 2 days.  The patient  states that he takes an inhaler medication but is not sure what all his  medicines are.   Of note, the patient was recently seen September 01, 2007, by his new  primary care Anjelita Sheahan, Alanda Amass in [facility].  At  that time, the patient was discontinued from his prednisone taper given  his clinically reassuring exam and the fact that he had already been on  steroids for 1 week.  Advair was started at that time as a controller  medication in addition to his as needed albuterol and scheduled Spiriva  which he was already taking.   ALLERGIES:  No know drug allergies.   MEDICATIONS:  1. Advair Diskus 250/50 one puff two times daily.  2. Spiriva 18 mcg caps inhale 1 capsule daily.  3. Albuterol 1-2 puffs every 4 hours as needed p.r.n.   PAST MEDICAL HISTORY:  1. Likely COPD.  2. History of subclinical hyperthyroidism, had a low TSH and normal T3-      T4 on previous hospital admission.  3. Pulmonary nodules diagnosed on CT scan May 2009.  The patient needs      a follow-up CT scan 1 year from the time of this  examination.  This      issue is currently stable.  4. PSA was normal on Oct 21, 2007.  5. Hemoglobin A1c elevated at 6.9 on May 2009, on hospital admission.   FAMILY HISTORY:  Noncontributory.   SOCIAL HISTORY:  The patient works as a Print production planner.  He stopped  smoking about 2 years ago.  The patient currently lives with his  daughter in the [place].  He last traveled to [place] in 2007.  He  has a son in [place] as well.   PHYSICAL EXAMINATION:  VITAL SIGNS:  Temperature was 97.0, pulse was 88  to 97, respirations 16 to 24, blood pressure is 123 to 170 over 70 to  114, O2 saturations were 84-98% .  The patient is currently sating 98%  on 2 L of oxygen at the time of my interview and exam.  GENERAL:  The patient is alert, well-hydrated and appears cachectic in  no acute distress.  HEENT:  Eyes, conjunctiva pink.  Sclerae clear.  Extraocular muscles are  intact.  Funduscopic exam is benign.  There was no hemorrhage, exudate  or papilledema.  Vision is grossly normal.  Tympanic membranes  bilaterally are clear.  External canals are nonerythematous.  Mouth,  oral  mucosa and oropharynx are without lesions or exudate.  Mucous  membranes are moist.  NECK:  There was no JVD.  The patient has prominent sternocleidomastoid  muscles bilaterally consistent with chronic COPD and cachexia.  LUNGS:  There is diffuse inspiratory wheeze and expiratory rhonchi.  There is a prolonged expiratory phase.  There is no increased work of  breathing on my exam and the patient is not tachypneic.  There is  decreased air movement throughout, worse in the lower lobes bilaterally,  but greatest in the left lower lobe.  HEART:  Normal rate and rhythm.  S1-S2 are normal without murmur, gallop  or rub.  ABDOMEN:  Bowel sounds are positive.  There is no masses.  He is  nontender, no rebounding or guarding.  Pulse is 4 dorsalis pedis and  radial pulses.  EXTREMITIES:  Moves all four.  There is no evidence of cyanosis,  clubbing or edema.  Extremities are noted to be thin but warm and well-  perfused.  NEUROLOGIC:  Grossly intact.  SKIN:  No suspicious lesions or rash.   INITIAL EMERGENCY DEPARTMENT WORKUP:  Chest x-ray shows hyperaeration  and bronchitic changes.  There is no infiltrate on chest x-ray.  EKG  shows normal sinus rhythm with evidence of right atrial enlargement and  right axis deviation, is not unchanged from previous EKG on previous  admission.  White blood cell count is 12.1, 77% neutrophils.  Hemoglobin  is 15.2 and platelet count is 203.  Complete metabolic panel was normal  except for slightly low potassium of 3.4 and AST and ALT elevated at 142  and 503 respectively.  Please note that liver function tests were not  abnormal on patient's previous admission.  B-type natriuretic peptide is  30.0.   ASSESSMENT/PLAN:  The patient is a 77 year old male with chronic  obstructive pulmonary disease who presents for likely exacerbation and  shortness of breath without evidence of infiltrate on chest x-ray.  1. Chronic obstructive pulmonary disease exacerbation.   The patient is      more comfortable on the emergency department after administration      of steroids and albuterol and Atrovent nebs x3 and we will continue      the  patient on q.4, q.2  albuterol and Atrovent nebulizers.  We      will start the patient on prednisone 40 mg daily after his dose of      IV steroids given in the emergency department.  The patient does      complain of subjective fever and mildly cough with some sputum      production and has a mildly increased white blood cell count.      Given his recent hospitalization, we will therefore start      antibiotics.  We will treat with Avelox given his recent      hospitalization to cover for hospital-acquired pathogens.  Plan for      10-day course of treatment.  Chest x-ray does not indicate      pneumonia.  We will provide supplemental oxygen as needed.  The      patient is currently sating well on 2 L in the emergency      department.  We will allow the patient to eat and drink ad lib,      place the patient on observation status.  The patient does have a      history of pulmonary nodule.  It has been stabilized on CT scan,      has plan for follow-up  in 1-year.  Purified protein derivative was      performed at last admission and found to be negative.  The patient      last traveled to [place] in 2007.  2. Transaminitis.  This is a new problem and there was no evidence of      elevated liver function tests on previous liver function panels on      his early readmission this month.  We will check an ethanol screen      and hepatitis panel.  We will also obtain a right upper quadrant      ultrasound to evaluate this further.  At this time, etiology of      this transaminitis is unclear.  Repeat complete metabolic panel in      the morning.  3. Subclinical hyperthyroidism.  We will obtain TSH, free T3-T4 on      this admission.  The patient has a history of low TSH and normal T3-      T4 on previous admission.  4.  Hyperglycemia.  We will follow the patient's fasting blood glucose      on steroids.  Last A1c earlier this month was 6.9.  5. Fluids, electrolytes, and nutrition/gastrointestinal.  We will      allow the patient to eat and drink ad lib.  We will not provide      intravenous fluids at this time as the patient appears well      hydrated and he does not have any clinical signs of dehydration.  6. Disposition.  We will admit the patient for observation status.  We      will follow his response to scheduled albuterol and Atrovent.      Importance of this patient will be follow up on medications to      ensure that he understands that he is taking his appropriate      controller medications.  Moreover, the patient will likely need to      complete full course of steroids and Avelox given possibly that      this could be treatment failure from previous admissions.      Myrtie Soman, MD  Electronically Signed      Nestor Ramp, MD  Electronically Signed    TE/MEDQ  D:  11/12/2007  T:  11/13/2007  Job:  [id number]

## 2010-10-27 NOTE — Discharge Summary (Signed)
Nathaniel, [name] NO.:  [id number]   MEDICAL RECORD NO.:  [id number]          PATIENT TYPE:  INP   LOCATION:  5501                         FACILITY:  [facility]   PHYSICIAN:  Paula Compton, MD        DATE OF BIRTH:  Nov 01, 1933   DATE OF ADMISSION:  02/28/2008  DATE OF DISCHARGE:  03/01/2008                               DISCHARGE SUMMARY   DISCHARGE DIAGNOSES:  1. Chronic obstructive pulmonary disease exacerbation.  2. Hypertension.  3. Diabetes mellitus.  4. Subclinical hyperthyroidism.   CONSULTS:  None.   PROCEDURE AND STUDIES:  1. Chest x-ray.  Impression, chronic lung changes consistent with COPD      exacerbation by apical and right mid lung scarring changes.  No      definite acute process.  2. EKG on February 28, 2008 normal sinus rhythm.  No significant ST      depression or elevation.  EKG was very difficult to read as there      was a lot of artifact.  3. EKG on February 29, 2008 normal sinus rhythm, possible incomplete      right bundle-branch block, left axis deviation.   DISCHARGE LABS:  Sodium 135, potassium 4.2, chloride 104, CO2 24, BUN  12, creatinine 0.70, glucose 241, calcium 7.9, total bili 0.6, alk phos  55, low protein 6.2, AST 22, ALT 14, albumin 3.4.  CBC; white count 5.7,  hemoglobin 12.7, hematocrit 39.3, platelets 172.  UDS negative.   BRIEF H AND P:  A 74-year male with a history of COPD, diabetes, and  hypertension presented with acute respiratory distress with increasing  nonproductive cough and need for supplemental oxygen.  It is of note  that the patient works at a [employer], technical sales and status post work had  increased cough and respiratory distress prior to presentation to the  emergency department.   HOSPITAL COURSE:  1. COPD.  Upon admission, the patient had increased work of breathing      and respiratory distress along with retractions and bilateral      coarse breath sounds as well as significant expiratory and  inspiratory wheeze.  The patient was initially sent to ICU for      concern of respiratory distress and tiring out, which would      indicate need for intubation.  However, the patient recovered well      with nebulizer treatments and was subsequently transferred to a      regular hospital floor.  The patient was given a dose of IV Solu-      Medrol and transition to p.o. prednisone as well as scheduled      albuterol and Atrovent nebulizers.  Supplemental oxygen was given      to keep saturation between 88 to 92%.  Prior to discharge, the      patient was ambulatory with O2 saturations of 96% on room air.      Avelox 400 mg daily x2 dose which was given prior to discharge. The      patient's respiratory status improved  over hospital course with      residual cough which resulted in pleuritic and chest wall      tenderness.  The patient was given Tessalon Perles for cough during      admission which gave some relief from nonproductive cough and      pleuritic chest pain.  The patient was discharged home on short-      acting beta agonist inhaled, anticholinergic inhaled, and steroids      and instructions to complete 10-day course of oral corticosteroids.  2. Hypertension.  The patient's blood pressure remained stable, was      initially slightly elevated; however, this was thought to be due to      beta-agonist inhalers and steroid use.  Prior to discharge, the      patient's BP was optimal for diabetic patient, which was less than      130/80.  The patient will continue Prinivil 10 mg p.o. daily.  3. Diabetes mellitus.  The patient had elevated blood sugars during      admission; however, was on oral prednisone.  The patient was      initially n.p.o. for respiratory distress and glipizide was held.      When the patient resumed p.o. intake, glipizide 5 mg q. daily was      restarted.  The patient was sent home on glipizide 5 mg p.o.  We      will follow with PCP for further diabetic  control.  4. Subclinical hyperthyroidism.  The patient did not want any further      workup regarding hyperthyroidism.   DISCHARGE MEDICATIONS:  1. Prednisone 60 mg p.o. daily x7 days.  2. Tessalon Perles 100 mg p.o. q.8 h. p.r.n. cough.  3. Advair 500 - 50 mcg 1 puff inhaled b.i.d.  4. Lisinopril 10 mg p.o. daily.  5. Ventolin 108 mcg inhaler 1-2 puffs q.4 h. p.r.n. shortness of      breath.  6. Glipizide 5 mg p.o. daily.  7. Spiriva 18 mcg inhaler 1 puff inhaled daily.  8. MiraLax 17 g 1 capsule p.o. daily p.r.n. constipation.  9. Tylenol 500 mg OTC 1 tablet p.o. q.4 h. p.r.n. pain on size of      chest.   The patient is to follow up with PCP in 1 week or return for evaluation  if the patient has shortness of breath or chest pain.   FOLLOW UP:  Dr. Alanda Amass [phone] on March 07, 2008 at 11 a.m.   ISSUES FOR FOLLOWUP:  None.   DISCHARGE CONDITION:  Stable/improved.      Milinda Antis, MD  Electronically Signed      Paula Compton, MD  Electronically Signed    KD/MEDQ  D:  03/03/2008  T:  03/04/2008  Job:  [id number]   cc:   Alanda Amass, M.D.

## 2010-10-30 NOTE — Discharge Summary (Signed)
Nathaniel [name], Nathaniel [name] NO.:  [id number]   MEDICAL RECORD NO.:  [id number]          PATIENT TYPE:  INP   LOCATION:  3708                         FACILITY:  [facility]   PHYSICIAN:  Lonia Blood, M.D.      DATE OF BIRTH:  19-Jul-1933   DATE OF ADMISSION:  08/05/2006  DATE OF DISCHARGE:  08/12/2006                               DISCHARGE SUMMARY   PRIMARY CARE PHYSICIAN:  The patient was unassigned on admission, but  she is to follow up with Dr. Mikeal Hawthorne.   DISCHARGE DIAGNOSIS:  1. Lung nodules presumed nonmalignant.  2. Hyperglycemia.  3. Chronic obstructive pulmonary disease exacerbation.  4. Tobacco abuse.  5. Sick euthyroid syndrome.  6. Normocytic anemia.  7. Tobacco abuse.  8. Sinus tachycardia with right bundle branch block.   DISCHARGE MEDICATIONS:  1. Prednisone taper.  2. Combivent inhaler twice a day.  3. Avelox 400 mg daily x4 days.  4. Advair Diskus one puff b.i.d. 150 strength.  5. Robitussin 5 mL every 6 hours as needed.   DISPOSITION:  The patient was discharged in stable condition.   FOLLOW UP:  He is to follow up with me in the clinic 1 week after  discharge.  During that visit, will re-evaluate him and see if we need  to continue with the steroids or if his symptoms have completely  resolved.   PROCEDURES:  1. Chest x-ray on February 22, that showed chronic obstructive      pulmonary disease and emphysema with extensive bilateral areas of      scarring, multiple thoracic vertebral body compression fractures.  2. Computed tomography chest without contrast on February 23, showed      no evidence for pneumonia and large mediastinal and hilar lymph      nodes which are nonspecific, probably from infection.  Scattered      less than 1 cm pulmonary nodules with followup examination in 3      months required.  3. Positron-emission tomography scan performed on February 28, that      showed no metabolically active tumor.   CONSULTATIONS:  None.   HISTORY OF PRESENT ILLNESS:  Please refer to dictated history and  physical on admission by Dr. Ebony Cargo. The patient is a 77 year old,  [place] with extensive tobacco history who came in with fever, cough  and chest pain that has been going on for 3 weeks.  The patient was non-  [place] speaking and spoke through an interpreter.  His chest pain was  mainly pleuritic in nature and has not responded to his initial  treatment.  Subsequently, the patient was brought in by his family for  further management.  In the ED,  he was found to be wheezing and also  slightly tachypneic.  Her was admitted with diagnosis of COPD  exacerbation.   HOSPITAL COURSE:  Problem 1.  CHRONIC OBSTRUCTIVE PULMONARY DISEASE  EXACERBATION:  The patient was initiated on IV steroids and nebulizers  which seemed to have worked well.  He responded to treatment very well.  Over the course of  his hospitalization, his nodules were discovered and  other findings as above.  Subsequently, he had a PET scan done which was  also normal.  At time of discharge, the patient is to continue with  prednisone taper, inhalers and inhaled steroids as well.  He will follow  up with me in the clinic for further management.   Problem 2.  LUNG NODULES:  As indicated.  The patient had lung nodules  that will be followed up as an outpatient.  In 3 months, he will have a  repeat chest x-ray since his PET scan was negative.   Problem 3.  TOBACCO ABUSE:  The patient was counseled in the hospital  and had nicotine patch while in the hospital.  He has been informed  about the dangers of smoking and I will continue to impress that on him  as an outpatient.   Problem 4.  NORMOCYTIC ANEMIA:  His hemoglobin remained somewhere  between 10 and 11.  Iron studies showed that there was no iron  deficiency, hence, this is probably secondary to chronic disease.   Problem 5.  DIABETES:  Due to steroids, the patient's CBGs were very  much  elevated.  He had no prior history of diabetes.  Once steroids were  tapered off, the patient seemed to have done better.   Problem 6.  SICK EUTHYROID SYNDROME:  The patient's TSH was elevated,  however, free T4 and T3 were within normal indicators of sick euthyroid.  Will follow that up as an outpatient also.  Otherwise, the patient was  discharged in fair health and will follow up with me as indicated as an  outpatient.      Lonia Blood, M.D.  Electronically Signed     LG/MEDQ  D:  12/29/2006  T:  12/30/2006  Job:  [id number]

## 2010-10-30 NOTE — H&P (Signed)
Nathaniel [name], Nathaniel [name] NO.:  [id number]   MEDICAL RECORD NO.:  [id number]          PATIENT TYPE:  EMS   LOCATION:  MAJO                         FACILITY:  [facility]   PHYSICIAN:  Hind I Elsaid, MD      DATE OF BIRTH:  June 23, 1933   DATE OF ADMISSION:  08/05/2006  DATE OF DISCHARGE:                              HISTORY & PHYSICAL   CHIEF COMPLAINT:  Fever, cough and chest pain, mainly for three weeks.   HISTORY OF PRESENT ILLNESS:  This is a 77 year old who is from [place].  History is taken from the daughter because of a language barrier.  According to the daughter, the patient has been sick for two weeks with  high-grade fever associated with chills but no sweating.  Also a cough  which is mainly dry.  He has been seen by __________ who prescribed  Avalox and Phenergan when the patient was diagnosed with pneumonia.  The  patient was admitted here and received antibiotics for 10 days but with  no significant improvement.  His condition is progressively getting  worse.  The chest pain is mainly pleuritic chest pain, mainly with  coughing and associated with vomiting.  He vomited twice with one mainly  clear liquid food, clear liquid fluid.  Last vomiting today was in the  emergency room.  The symptoms have no relieving factors.  They are not  associated with other complaints and his condition is also associated  with shortness of breath.   PAST MEDICAL HISTORY:  No known past medical history.   MEDICATIONS:  Recently received Avalox and Phenergan by __________.   ALLERGIES:  NO KNOWN DRUG ALLERGIES.   SOCIAL HISTORY:  He lives by himself.  He lives with his roommate.  He  has one daughter.  She lives by herself.  His wife died more than 12  years ago.   PHYSICAL EXAMINATION:  VITAL SIGNS:  Temperature 103.3, blood pressure  118/68, pulse rate 118, respiratory rate 22.  GENERAL APPEARANCE:  The patient looks to be using his accessory muscles  for breathing.  He  is using his accessory muscle for breathing.  He is  moaning with pain.  He is cachectic.  He appears acutely ill.  HEENT:  Normocephalic, atraumatic.  Mildly pale but no jaundice.  Extraocular muscle movement normal.  Pupils are equal and reactive to  light and accommodation.  Mucous membranes are dry.  NECK:  Supple.  Full range of motion.  No lymphadenopathy.  No  thyromegaly.  CARDIOVASCULAR:  Regular rate and rhythm.  RESPIRATORY:  There are scattered rhonchi and there is bronchial  breathing, mainly in the upper right side and there is diminished air  entry on the left side.  ABDOMEN:  Soft, nontender, nondistended.  Bowel sounds are positive.  No  masses.  EXTREMITIES:  Normal.  Peripheral pulses intact.  There is no lower limb  edema.  NEUROLOGICAL:  The patient is alert and oriented x3.  Cranial nerves II-  XII are intact.  Motor intact in all extremities.  Sensation is normal.  SKIN:  Color normal.  No rashes.   LABORATORY DATA:  Sodium 136, potassium 4.2, chloride 102, BUN 16,  creatinine 0.8.  White blood cells 5.8, hemoglobin 13, hematocrit 38.5,  platelets 182.  EKG:  Sinus tachy.  Right atrial enlargement and right  bundle branch block.  Chest x-ray:  COPD/emphysema.  Extensive bilateral  area of scarring.  No heart failure or pneumonia.  Multiple age  indeterminate.  Compression fracture in the lower T-spine.   SUMMARY:  The patient is admitted mainly for fever with pleuritic chest  pain and cough.  With his history of current chronic smoking history,  chronic obstructive pulmonary disease exacerbation with underlying  infection but, because of the chronicity of the illness, I cannot rule  out complication of pneumonia.  I will get the patient nebs treatments,  Solu-Medrol 60 mg IV q.8h.  We will start the patient on Zithromax or  Rocephin.  We will get sputum cultures.  We will get flu/influenza A and  B and will get Legionella antigen in the urine.  I am going to  order a  CT scan of the chest with IV contrast to rule out loculated effusion or  empyema and smoking cessation and counseling.  DVT and GI prophylaxis.      Hind Bosie Helper, MD     HIE/MEDQ  D:  08/06/2006  T:  08/07/2006  Job:  [id number]

## 2011-03-10 LAB — CBC
HCT: 39.4
HCT: 43.9
Hemoglobin: 15.2
MCHC: 33
MCHC: 33
MCV: 87.8
Platelets: 144 — ABNORMAL LOW
Platelets: 148 — ABNORMAL LOW
Platelets: 203
RDW: 13.2
WBC: 14 — ABNORMAL HIGH
WBC: 9.1

## 2011-03-10 LAB — COMPREHENSIVE METABOLIC PANEL
ALT: 17
ALT: 503 — ABNORMAL HIGH
AST: 14
Albumin: 3.6
Albumin: 3.6
Alkaline Phosphatase: 56
BUN: 13
Calcium: 8.3 — ABNORMAL LOW
Chloride: 102
GFR calc Af Amer: >60
Glucose, Bld: 161 — ABNORMAL HIGH
Potassium: 3.8
Sodium: 136
Sodium: 137
Total Bilirubin: 1.4 — ABNORMAL HIGH
Total Protein: 6.6
Total Protein: 7.1

## 2011-03-10 LAB — POCT CARDIAC MARKERS
CKMB, poc: <1 — ABNORMAL LOW
Myoglobin, poc: 33.5
Myoglobin, poc: 45.3
Operator id: 196461
Operator id: 285491
Troponin i, poc: <0.05

## 2011-03-10 LAB — POCT I-STAT 3, ART BLOOD GAS (G3+)
Acid-Base Excess: 2
Bicarbonate: 25.3 — ABNORMAL HIGH
Operator id: 280991
pH, Arterial: 7.456 — ABNORMAL HIGH

## 2011-03-10 LAB — DIFFERENTIAL
Basophils Absolute: 0
Basophils Absolute: 0
Basophils Relative: 0
Basophils Relative: 0
Eosinophils Absolute: 0.8 — ABNORMAL HIGH
Eosinophils Relative: 2
Eosinophils Relative: 6 — ABNORMAL HIGH
Monocytes Absolute: 0.4
Monocytes Absolute: 0.8
Monocytes Relative: 6
Neutro Abs: 9.3 — ABNORMAL HIGH
Neutrophils Relative %: 77

## 2011-03-10 LAB — HEPATITIS B SURFACE ANTIGEN: Hepatitis B Surface Ag: NEGATIVE

## 2011-03-11 LAB — COMPREHENSIVE METABOLIC PANEL
AST: 57 — ABNORMAL HIGH
Albumin: 3.1 — ABNORMAL LOW
BUN: 14
Calcium: 8.5
Creatinine, Ser: 0.79
GFR calc Af Amer: >60
Total Bilirubin: 0.6
Total Protein: 5.7 — ABNORMAL LOW

## 2011-03-11 LAB — CBC
HCT: 37.9 — ABNORMAL LOW
MCHC: 33.1
MCV: 88.3
Platelets: 196
RDW: 13.4
WBC: 14 — ABNORMAL HIGH

## 2011-03-11 LAB — T4: T4, Total: 7

## 2011-03-11 LAB — TSH: TSH: 0.337 — ABNORMAL LOW

## 2011-03-15 LAB — CBC
HCT: 39.3
HCT: 42.6
MCHC: 32.4
MCHC: 33.5
MCV: 87.9
MCV: 89.9
Platelets: 172
Platelets: 198
RBC: 4.37
RBC: 4.84
WBC: 9.3

## 2011-03-15 LAB — COMPREHENSIVE METABOLIC PANEL
AST: 22
BUN: 12
BUN: 13
CO2: 24
CO2: 25
Calcium: 7.9 — ABNORMAL LOW
Calcium: 8.6
Chloride: 106
Creatinine, Ser: 0.67
Creatinine, Ser: 0.7
GFR calc Af Amer: >60
GFR calc Af Amer: >60
GFR calc non Af Amer: >60
GFR calc non Af Amer: >60
Total Bilirubin: 0.6
Total Bilirubin: 0.8

## 2011-03-15 LAB — D-DIMER, QUANTITATIVE: D-Dimer, Quant: 0.73 — ABNORMAL HIGH

## 2011-03-15 LAB — GLUCOSE, CAPILLARY
Glucose-Capillary: 181 — ABNORMAL HIGH
Glucose-Capillary: 199 — ABNORMAL HIGH
Glucose-Capillary: 209 — ABNORMAL HIGH

## 2011-03-15 LAB — POCT CARDIAC MARKERS
CKMB, poc: 2.3
CKMB, poc: 3.2
Myoglobin, poc: 72.1
Myoglobin, poc: 73.1
Troponin i, poc: <0.05

## 2011-03-15 LAB — URINE CULTURE: Colony Count: NO GROWTH

## 2011-03-15 LAB — POCT I-STAT 3, ART BLOOD GAS (G3+)
TCO2: 29
pCO2 arterial: 51.2 — ABNORMAL HIGH
pH, Arterial: 7.345 — ABNORMAL LOW

## 2011-03-15 LAB — URINALYSIS, ROUTINE W REFLEX MICROSCOPIC
Nitrite: NEGATIVE
Specific Gravity, Urine: 1.013
Urobilinogen, UA: 0.2
pH: 5.5

## 2011-03-15 LAB — DIFFERENTIAL
Basophils Absolute: 0
Lymphocytes Relative: 20
Lymphs Abs: 1.9
Neutro Abs: 6

## 2011-03-15 LAB — CULTURE, BLOOD (ROUTINE X 2): Culture: NO GROWTH

## 2011-03-15 LAB — PROTIME-INR
INR: 1
Prothrombin Time: 13.2

## 2011-03-15 LAB — APTT: aPTT: 31

## 2011-03-15 LAB — RAPID URINE DRUG SCREEN, HOSP PERFORMED: Tetrahydrocannabinol: NOT DETECTED

## 2011-09-16 ENCOUNTER — Ambulatory Visit (INDEPENDENT_AMBULATORY_CARE_PROVIDER_SITE_OTHER): Payer: Medicare Other | Admitting: Family Medicine

## 2011-09-16 ENCOUNTER — Ambulatory Visit: Payer: Medicare Other

## 2011-09-16 VITALS — BP 172/94 | HR 76 | Temp 97.4°F | Resp 18 | Ht 62.0 in | Wt 116.0 lb

## 2011-09-16 DIAGNOSIS — R109 Unspecified abdominal pain: Secondary | ICD-10-CM

## 2011-09-16 DIAGNOSIS — G629 Polyneuropathy, unspecified: Secondary | ICD-10-CM | POA: Insufficient documentation

## 2011-09-16 DIAGNOSIS — I10 Essential (primary) hypertension: Secondary | ICD-10-CM | POA: Insufficient documentation

## 2011-09-16 DIAGNOSIS — K5900 Constipation, unspecified: Secondary | ICD-10-CM

## 2011-09-16 DIAGNOSIS — E119 Type 2 diabetes mellitus without complications: Secondary | ICD-10-CM | POA: Insufficient documentation

## 2011-09-16 DIAGNOSIS — E785 Hyperlipidemia, unspecified: Secondary | ICD-10-CM

## 2011-09-16 DIAGNOSIS — G589 Mononeuropathy, unspecified: Secondary | ICD-10-CM

## 2011-09-16 LAB — LIPID PANEL
Cholesterol: 177 mg/dL (ref 0–200)
HDL: 48 mg/dL (ref >39)
LDL Cholesterol: 109 mg/dL — ABNORMAL HIGH (ref 0–99)
Total CHOL/HDL Ratio: 3.7 Ratio
Triglycerides: 102 mg/dL (ref <150)
VLDL: 20 mg/dL (ref 0–40)

## 2011-09-16 LAB — COMPREHENSIVE METABOLIC PANEL WITH GFR
Albumin: 4.6 g/dL (ref 3.5–5.2)
Alkaline Phosphatase: 58 U/L (ref 39–117)
BUN: 17 mg/dL (ref 6–23)
Creat: 0.76 mg/dL (ref 0.50–1.35)
Glucose, Bld: 109 mg/dL — ABNORMAL HIGH (ref 70–99)
Total Bilirubin: 0.5 mg/dL (ref 0.3–1.2)

## 2011-09-16 LAB — POCT CBC
Granulocyte percent: 80 %G (ref 37–80)
HCT, POC: 42.9 % — AB (ref 43.5–53.7)
Hemoglobin: 13.5 g/dL — AB (ref 14.1–18.1)
Lymph, poc: 1.5 (ref 0.6–3.4)
MCH, POC: 27.6 pg (ref 27–31.2)
MCHC: 31.5 g/dL — AB (ref 31.8–35.4)
MCV: 87.6 fL (ref 80–97)
MID (cbc): 0.5 (ref 0–0.9)
MPV: 8.2 fL (ref 0–99.8)
POC Granulocyte: 8.1 — AB (ref 2–6.9)
POC LYMPH PERCENT: 15 % (ref 10–50)
POC MID %: 5 %M (ref 0–12)
Platelet Count, POC: 213 10*3/uL (ref 142–424)
RBC: 4.9 M/uL (ref 4.69–6.13)
RDW, POC: 14.8 %
WBC: 10.1 10*3/uL (ref 4.6–10.2)

## 2011-09-16 LAB — COMPREHENSIVE METABOLIC PANEL
ALT: 26 U/L (ref 0–53)
AST: 28 U/L (ref 0–37)
CO2: 29 mEq/L (ref 19–32)
Calcium: 8.9 mg/dL (ref 8.4–10.5)
Chloride: 103 mEq/L (ref 96–112)
Potassium: 4.1 mEq/L (ref 3.5–5.3)
Sodium: 140 mEq/L (ref 135–145)
Total Protein: 7.7 g/dL (ref 6.0–8.3)

## 2011-09-16 LAB — POCT GLYCOSYLATED HEMOGLOBIN (HGB A1C): Hemoglobin A1C: 7.4

## 2011-09-16 LAB — TSH: TSH: 1.037 u[IU]/mL (ref 0.350–4.500)

## 2011-09-16 MED ORDER — LISINOPRIL-HYDROCHLOROTHIAZIDE 10-12.5 MG PO TABS: 1.0000 | ORAL_TABLET | Freq: Every day | ORAL | Status: DC

## 2011-09-16 MED ORDER — POLYETHYLENE GLYCOL 3350 17 GM/SCOOP PO POWD: 17.0000 g | Freq: Every day | ORAL | Status: AC

## 2011-09-16 MED ORDER — PRAVASTATIN SODIUM 40 MG PO TABS: 40.0000 mg | ORAL_TABLET | Freq: Every day | ORAL | Status: DC

## 2011-09-16 MED ORDER — METFORMIN HCL 500 MG PO TABS: 500.0000 mg | ORAL_TABLET | Freq: Two times a day (BID) | ORAL | Status: DC

## 2011-09-16 NOTE — Progress Notes (Signed)
[facility]:  Office Visit  Chief Complaint:  Chief Complaint  Patient presents with  . Constipation    x 4 days  . Medication Refill    HPI: Nathaniel [name] is a 78 y.o. male who complains of: 1. HTN- not well controlled, patient ran out meds have not taken in over 9 months. Does not take BP at home. Does not watch salt intake 2. T2DM-not well controlled, patient ran out of meds again 9 months ago. Does not watch diet, does not go for annual eye exam.  3. Neuropathy-in Bilateral legs   Past Medical History  Diagnosis Date  . Diabetes mellitus   . Hyperlipidemia   . Hypertension    History reviewed. No pertinent past surgical history. History   Social History  . Marital Status: Widowed    Spouse Name: N/A    Number of Children: N/A  . Years of Education: N/A   Social History Main Topics  . Smoking status: Never Smoker   . Smokeless tobacco: None  . Alcohol Use: No  . Drug Use: No  . Sexually Active: None   Other Topics Concern  . None   Social History Narrative  . None   No family history on file. No Known Allergies Prior to Admission medications   Medication Sig Start Date End Date Taking? Authorizing Provider  albuterol (VENTOLIN HFA) 108 (90 BASE) MCG/ACT inhaler Inhale 1-2 puffs every 4 hours as needed for breathing difficulties    Yes Historical Provider, MD  Fluticasone-Salmeterol (ADVAIR) 500-50 MCG/DOSE AEPB Inhale 1 puff into the lungs 2 (two) times daily. 09/28/10  Yes Todd D McDiarmid, MD  glucose blood test strip 1 each by Other route as needed. Use as instructed   Yes Historical Provider, MD  Lancets (FREESTYLE) lancets 1 each by Other route as needed. Use as instructed   Yes Historical Provider, MD  lisinopril-hydrochlorothiazide (PRINZIDE,ZESTORETIC) 10-12.5 MG per tablet 1 tablet by mouth daily for high blood pressure    Yes Historical Provider, MD  metFORMIN (GLUCOPHAGE) 500 MG tablet 1 tablet by mouth twice daily for diabetes     Yes Historical Provider, MD  montelukast (SINGULAIR) 10 MG tablet Take 10 mg by mouth at bedtime.   Yes Historical Provider, MD  pravastatin (PRAVACHOL) 40 MG tablet Take 40 mg by mouth daily.   Yes Historical Provider, MD  tiotropium (SPIRIVA) 18 MCG inhalation capsule Place 18 mcg into inhaler and inhale daily. 1 cap inhaled daily     Yes Historical Provider, MD     ROS: The patient denies fevers, chills, night sweats, unintentional weight loss, chest pain, palpitations, wheezing, dyspnea on exertion, nausea, vomiting, abdominal pain, dysuria, hematuria, melena, weakness, + numbness/ tingling/cramps bilateral calf without swelling.  All other systems have been reviewed and were otherwise negative with the exception of those mentioned in the HPI and as above.    PHYSICAL EXAM: Filed Vitals:   09/16/11 1005  BP: 172/94  Pulse: 76  Temp: 97.4 F (36.3 C)  Resp: 18   Filed Vitals:   09/16/11 1005  Height: 5\' 2"  (1.575 m)  Weight: 116 lb (52.617 kg)   Body mass index is 21.22 kg/(m^2).  General: Alert, no acute distress HEENT:  Normocephalic, atraumatic, oropharynx patent. EOMI, PERRLA, Tm nl Cardiovascular:  Regular rate and rhythm, no rubs murmurs or gallops.  No Carotid bruits, radial pulse intact. No pedal edema.  Respiratory: Clear to auscultation bilaterally.  No wheezes, rales, or rhonchi.  No cyanosis,  no use of accessory musculature GI: No organomegaly, abdomen is soft and non-tender, positive bowel sounds.  No masses. Skin: No rashes. Neurologic: Facial musculature symmetric. Psychiatric: Patient is appropriate throughout our interaction. Lymphatic: No cervical lymphadenopathy Musculoskeletal: Gait intact.   LABS: Results for orders placed in visit on 09/16/11  POCT CBC      Component Value Range   WBC 10.1  4.6 - 10.2 (K/uL)   Lymph, poc 1.5  0.6 - 3.4    POC LYMPH PERCENT 15.0  10 - 50 (%L)   MID (cbc) 0.5  0 - 0.9    POC MID % 5.0  0 - 12 (%M)   POC  Granulocyte 8.1 (*) 2 - 6.9    Granulocyte percent 80.0  37 - 80 (%G)   RBC 4.90  4.69 - 6.13 (M/uL)   Hemoglobin 13.5 (*) 14.1 - 18.1 (g/dL)   HCT, POC 29.5 (*) 62.1 - 53.7 (%)   MCV 87.6  80 - 97 (fL)   MCH, POC 27.6  27 - 31.2 (pg)   MCHC 31.5 (*) 31.8 - 35.4 (g/dL)   RDW, POC 30.8     Platelet Count, POC 213  142 - 424 (K/uL)   MPV 8.2  0 - 99.8 (fL)  POCT GLYCOSYLATED HEMOGLOBIN (HGB A1C)      Component Value Range   Hemoglobin A1C 7.4       EKG/XRAY:   Primary read interpreted by Dr. Conley Rolls at [facility]. Non acute abd. Nonspecific gas patterns. No free air.    ASSESSMENT/PLAN: Encounter Diagnoses  Name Primary?  . HTN (hypertension) Yes  . Hyperlipidemia   . Diabetes mellitus   . Constipation   . Abdominal pain   . Neuropathy    Refill meds, discussed labs with patient. Discussed issues of medical ramifications with  non compliancy. Rx Miralax for constipation F/u in 3 months for annual and diabetes F/u. Advised him to go down to appt only clinic     Marykathryn Carboni PHUONG, DO 09/16/2011 1:06 PM

## 2011-09-17 ENCOUNTER — Encounter: Payer: Self-pay | Admitting: Family Medicine

## 2011-09-17 LAB — MICROALBUMIN, URINE: Microalb, Ur: 3.96 mg/dL — ABNORMAL HIGH (ref 0.00–1.89)

## 2011-09-25 ENCOUNTER — Ambulatory Visit (INDEPENDENT_AMBULATORY_CARE_PROVIDER_SITE_OTHER): Payer: Medicare Other | Admitting: Family Medicine

## 2011-09-25 ENCOUNTER — Other Ambulatory Visit: Payer: Self-pay | Admitting: *Deleted

## 2011-09-25 VITALS — BP 124/81 | HR 93 | Temp 97.7°F | Resp 24 | Ht 63.0 in

## 2011-09-25 DIAGNOSIS — J449 Chronic obstructive pulmonary disease, unspecified: Secondary | ICD-10-CM

## 2011-09-25 DIAGNOSIS — R109 Unspecified abdominal pain: Secondary | ICD-10-CM

## 2011-09-25 LAB — POCT CBC
Granulocyte percent: 81.1 %G — AB (ref 37–80)
HCT, POC: 45.1 % (ref 43.5–53.7)
Hemoglobin: 14.6 g/dL (ref 14.1–18.1)
Lymph, poc: 1.4 (ref 0.6–3.4)
MCHC: 32.4 g/dL (ref 31.8–35.4)
MCV: 87.3 fL (ref 80–97)
POC Granulocyte: 8.9 — AB (ref 2–6.9)
POC LYMPH PERCENT: 13.1 %L (ref 10–50)
RDW, POC: 13.5 %

## 2011-09-25 LAB — POCT SEDIMENTATION RATE: POCT SED RATE: 31 mm/hr — AB (ref 0–22)

## 2011-09-25 MED ORDER — AZITHROMYCIN 250 MG PO TABS: ORAL_TABLET | ORAL | Status: AC

## 2011-09-25 MED ORDER — GLUCOSE BLOOD VI STRP: ORAL_STRIP | Status: DC

## 2011-09-25 MED ORDER — ALBUTEROL SULFATE (2.5 MG/3ML) 0.083% IN NEBU
2.5000 mg | INHALATION_SOLUTION | Freq: Once | RESPIRATORY_TRACT | Status: AC
  Administered 2011-09-25: 2.5 mg via RESPIRATORY_TRACT

## 2011-09-25 MED ORDER — ACCU-CHEK MULTICLIX LANCETS MISC: Status: DC

## 2011-09-25 MED ORDER — IPRATROPIUM BROMIDE 0.02 % IN SOLN
0.5000 mg | Freq: Once | RESPIRATORY_TRACT | Status: AC
  Administered 2011-09-25: 0.5 mg via RESPIRATORY_TRACT

## 2011-09-25 MED ORDER — AZITHROMYCIN 250 MG PO TABS: ORAL_TABLET | ORAL | Status: DC

## 2011-09-25 MED ORDER — PREDNISONE 20 MG PO TABS: 20.0000 mg | ORAL_TABLET | Freq: Every day | ORAL | Status: DC

## 2011-09-25 MED ORDER — PREDNISONE 20 MG PO TABS: 20.0000 mg | ORAL_TABLET | Freq: Every day | ORAL | Status: AC

## 2011-09-25 NOTE — Progress Notes (Signed)
  Subjective:    Patient ID: Nathaniel [name], male    DOB: 03-16-1934, 78 y.o.   MRN: [id number]  HPI Patient presents complaining of (L) sided CP on and off for many years.Currently not experiencing CP. Patient experiences CP with cough. Coughs occasionally at night.  Also states coughs when his stomach hurts.  Abdominal pain since last July.  Pain unchanged. Does experience a bitter taste in the back of his mouth.  Asthma/COPD- increase in DOE.  Using Advair BID daily.    Constipation X 4 days.  Never had colonoscopy  Never rhinorrhea States tactile fever at night.   Review of Systems     Objective:   Physical Exam  Constitutional: He appears well-developed.  Neck: Neck supple. No thyromegaly present.  Cardiovascular: Normal rate, regular rhythm and normal heart sounds.   Pulmonary/Chest: Wheezes: scattered wheezed; good air movement.  Abdominal: Soft. Bowel sounds are normal. He exhibits no distension. There is no tenderness.  Neurological: He is alert.  Skin: Skin is warm.          Assessment & Plan:   1. COPD (chronic obstructive pulmonary disease)  albuterol (PROVENTIL) (2.5 MG/3ML) 0.083% nebulizer solution 2.5 mg, ipratropium (ATROVENT) nebulizer solution 0.5 mg, azithromycin (ZITHROMAX) 250 MG tablet, predniSONE (DELTASONE) 20 MG tablet, DISCONTINUED: azithromycin (ZITHROMAX) 250 MG tablet, DISCONTINUED: predniSONE (DELTASONE) 20 MG tablet  2. Pain in the abdomen  POCT CBC, POCT SEDIMENTATION RATE   Patient asked to check BS BID since he is starting prednisone.  His daughter is to call should his BS trend up.  Follow up will be in 4 days.

## 2011-09-28 ENCOUNTER — Encounter: Payer: Self-pay | Admitting: Family Medicine

## 2011-10-21 ENCOUNTER — Encounter: Payer: Self-pay | Admitting: Family Medicine

## 2011-12-14 ENCOUNTER — Other Ambulatory Visit: Payer: Self-pay | Admitting: Family Medicine

## 2011-12-14 DIAGNOSIS — J449 Chronic obstructive pulmonary disease, unspecified: Secondary | ICD-10-CM

## 2011-12-14 MED ORDER — FLUTICASONE-SALMETEROL 500-50 MCG/DOSE IN AEPB: 1.0000 | INHALATION_SPRAY | Freq: Two times a day (BID) | RESPIRATORY_TRACT | Status: DC

## 2011-12-23 ENCOUNTER — Other Ambulatory Visit: Payer: Self-pay | Admitting: Family Medicine

## 2012-01-03 ENCOUNTER — Ambulatory Visit (INDEPENDENT_AMBULATORY_CARE_PROVIDER_SITE_OTHER): Payer: Medicare Other | Admitting: Family Medicine

## 2012-01-03 VITALS — BP 172/82 | HR 70 | Temp 97.7°F | Resp 16 | Ht 63.0 in | Wt 114.0 lb

## 2012-01-03 DIAGNOSIS — J449 Chronic obstructive pulmonary disease, unspecified: Secondary | ICD-10-CM

## 2012-01-03 DIAGNOSIS — K219 Gastro-esophageal reflux disease without esophagitis: Secondary | ICD-10-CM | POA: Insufficient documentation

## 2012-01-03 DIAGNOSIS — G589 Mononeuropathy, unspecified: Secondary | ICD-10-CM

## 2012-01-03 DIAGNOSIS — E119 Type 2 diabetes mellitus without complications: Secondary | ICD-10-CM

## 2012-01-03 DIAGNOSIS — J4489 Other specified chronic obstructive pulmonary disease: Secondary | ICD-10-CM

## 2012-01-03 DIAGNOSIS — G629 Polyneuropathy, unspecified: Secondary | ICD-10-CM

## 2012-01-03 LAB — POCT GLYCOSYLATED HEMOGLOBIN (HGB A1C): Hemoglobin A1C: 7.2

## 2012-01-03 MED ORDER — OMEPRAZOLE 20 MG PO CPDR: 20.0000 mg | DELAYED_RELEASE_CAPSULE | Freq: Every day | ORAL | Status: DC

## 2012-01-03 MED ORDER — FREESTYLE LANCETS MISC: Status: DC

## 2012-01-03 MED ORDER — GABAPENTIN 100 MG PO CAPS: 100.0000 mg | ORAL_CAPSULE | Freq: Three times a day (TID) | ORAL | Status: DC

## 2012-01-03 MED ORDER — FLUTICASONE-SALMETEROL 500-50 MCG/DOSE IN AEPB: 1.0000 | INHALATION_SPRAY | Freq: Two times a day (BID) | RESPIRATORY_TRACT | Status: DC

## 2012-01-03 MED ORDER — TIOTROPIUM BROMIDE MONOHYDRATE 18 MCG IN CAPS: 18.0000 ug | ORAL_CAPSULE | Freq: Every day | RESPIRATORY_TRACT | Status: DC

## 2012-01-03 MED ORDER — GLUCOSE BLOOD VI STRP: ORAL_STRIP | Status: DC

## 2012-01-03 NOTE — Progress Notes (Signed)
 [facility]:  Office Visit  Chief Complaint:  Chief Complaint  Patient presents with  . Hypertension  . Diabetes    Please check his glucometer.  . Asthma    Has gerd symptoms (burning) for 1 month.    HPI: Nathaniel [name] is a 78 y.o. male who complains of  : 1. Recheck for diabetes.Patient denies any hypoglycemic events.Patient has not been taking his blood sugar for over 1 year due to lack of lancets and test strips.Admits to neuropathy. Takes medications now. Regular eye exams UTD. 2. HTN-takes medications. No SEs 3. Has upset stomach. "gurgle sounds".  Patient does not speak english well, speaks some vietnamese.   Past Medical History  Diagnosis Date  . Diabetes mellitus   . Hyperlipidemia   . Hypertension    No past surgical history on file. History   Social History  . Marital Status: Widowed    Spouse Name: N/A    Number of Children: N/A  . Years of Education: N/A   Social History Main Topics  . Smoking status: Never Smoker   . Smokeless tobacco: None  . Alcohol Use: No  . Drug Use: No  . Sexually Active: None   Other Topics Concern  . None   Social History Narrative  . None   No family history on file. No Known Allergies Prior to Admission medications   Medication Sig Start Date End Date Taking? Authorizing Provider  albuterol (VENTOLIN HFA) 108 (90 BASE) MCG/ACT inhaler Inhale 1-2 puffs every 4 hours as needed for breathing difficulties    Yes Historical Provider, MD  Fluticasone-Salmeterol (ADVAIR) 500-50 MCG/DOSE AEPB Inhale 1 puff into the lungs 2 (two) times daily. 12/14/11  Yes Ryan M Dunn, PA-C  glucose blood test strip 1 each by Other route as needed. Use as instructed   Yes Historical Provider, MD  Lancets (FREESTYLE) lancets 1 each by Other route as needed. Use as instructed   Yes Historical Provider, MD  lisinopril-hydrochlorothiazide (PRINZIDE,ZESTORETIC) 10-12.5 MG per tablet Take 1 tablet by mouth daily. 1 tablet by mouth  daily for high blood pressure 09/16/11  Yes  P , DO  metFORMIN (GLUCOPHAGE) 500 MG tablet Take 1 tablet (500 mg total) by mouth 2 (two) times daily with a meal. 1 tablet by mouth twice daily for diabetes 09/16/11  Yes  P , DO  montelukast (SINGULAIR) 10 MG tablet take 1 tablet by mouth once daily 12/23/11  Yes Heather M Marte, PA-C  pravastatin (PRAVACHOL) 40 MG tablet Take 1 tablet (40 mg total) by mouth daily. 09/16/11  Yes  P , DO  tiotropium (SPIRIVA) 18 MCG inhalation capsule Place 18 mcg into inhaler and inhale daily. 1 cap inhaled daily     Yes Historical Provider, MD  glucose blood (CHOICE DM FORA G20 TEST STRIPS) test strip Use as instructed 09/25/11 09/24/12  Dois Davenport, MD  Lancets (ACCU-CHEK MULTICLIX) lancets Use as instructed 09/25/11 09/24/12  Dois Davenport, MD     ROS: The patient denies fevers, chills, night sweats, unintentional weight loss, chest pain, palpitations, wheezing, dyspnea on exertion, nausea, vomiting, abdominal pain, dysuria, hematuria, melena, + numbness and  tingling.   All other systems have been reviewed and were otherwise negative with the exception of those mentioned in the HPI and as above.    PHYSICAL EXAM: Filed Vitals:   01/03/12 1153  BP: 172/82  Pulse: 70  Temp: 97.7 F (36.5 C)  Resp: 16   Filed Vitals:  01/03/12 1153  Height: 5\' 3"  (1.6 m)  Weight: 114 lb (51.71 kg)   Body mass index is 20.19 kg/(m^2).  General: Alert, no acute distress HEENT:  Normocephalic, atraumatic, oropharynx patent.  Cardiovascular:  Regular rate and rhythm, no rubs murmurs or gallops.  No Carotid bruits, radial pulse intact. No pedal edema.  Respiratory: Clear to auscultation bilaterally.  No wheezes, rales, or rhonchi.  No cyanosis, no use of accessory musculature GI: No organomegaly, abdomen is soft and non-tender, positive bowel sounds.  No masses. Skin: No rashes. Neurologic: Facial musculature symmetric.+ neuropathy on microfilament exam  bilaterally in  Psychiatric: Patient is appropriate throughout our interaction. Lymphatic: No cervical lymphadenopathy Musculoskeletal: Gait intact.   LABS: Results for orders placed in visit on 01/03/12  POCT GLYCOSYLATED HEMOGLOBIN (HGB A1C)      Component Value Range   Hemoglobin A1C 7.2       EKG/XRAY:   Primary read interpreted by Dr. Conley Rolls at [facility].   ASSESSMENT/PLAN: Encounter Diagnoses  Name Primary?  Marland Kitchen COPD Yes  . Diabetes mellitus   . Neuropathy   . GERD (gastroesophageal reflux disease)     Refill meds Check HbA1c Rx Gabapentin Rx Prilosec OTC for GERD F/u in 3 months for annual exam and fasting labs    ,  PHUONG, DO 01/03/2012 12:52 PM

## 2012-02-06 ENCOUNTER — Emergency Department (HOSPITAL_COMMUNITY): Payer: Medicare Other

## 2012-02-06 ENCOUNTER — Inpatient Hospital Stay (HOSPITAL_COMMUNITY)
Admission: EM | Admit: 2012-02-06 | Discharge: 2012-02-10 | DRG: 690 | Disposition: A | Discharge status: Home / Self Care | Payer: Medicare Other | Attending: Family Medicine | Admitting: Family Medicine

## 2012-02-06 DIAGNOSIS — Z79899 Other long term (current) drug therapy: Secondary | ICD-10-CM

## 2012-02-06 DIAGNOSIS — K219 Gastro-esophageal reflux disease without esophagitis: Secondary | ICD-10-CM | POA: Diagnosis present

## 2012-02-06 DIAGNOSIS — E0590 Thyrotoxicosis, unspecified without thyrotoxic crisis or storm: Secondary | ICD-10-CM

## 2012-02-06 DIAGNOSIS — N390 Urinary tract infection, site not specified: Secondary | ICD-10-CM

## 2012-02-06 DIAGNOSIS — J449 Chronic obstructive pulmonary disease, unspecified: Secondary | ICD-10-CM

## 2012-02-06 DIAGNOSIS — A498 Other bacterial infections of unspecified site: Secondary | ICD-10-CM | POA: Diagnosis present

## 2012-02-06 DIAGNOSIS — E119 Type 2 diabetes mellitus without complications: Secondary | ICD-10-CM | POA: Diagnosis present

## 2012-02-06 DIAGNOSIS — J984 Other disorders of lung: Secondary | ICD-10-CM

## 2012-02-06 DIAGNOSIS — N3080 Other cystitis without hematuria: Principal | ICD-10-CM | POA: Diagnosis present

## 2012-02-06 DIAGNOSIS — E785 Hyperlipidemia, unspecified: Secondary | ICD-10-CM | POA: Diagnosis present

## 2012-02-06 DIAGNOSIS — N138 Other obstructive and reflux uropathy: Secondary | ICD-10-CM | POA: Diagnosis present

## 2012-02-06 DIAGNOSIS — I10 Essential (primary) hypertension: Secondary | ICD-10-CM | POA: Diagnosis present

## 2012-02-06 DIAGNOSIS — G629 Polyneuropathy, unspecified: Secondary | ICD-10-CM

## 2012-02-06 DIAGNOSIS — R339 Retention of urine, unspecified: Secondary | ICD-10-CM | POA: Diagnosis present

## 2012-02-06 DIAGNOSIS — N401 Enlarged prostate with lower urinary tract symptoms: Secondary | ICD-10-CM | POA: Diagnosis present

## 2012-02-06 DIAGNOSIS — J4489 Other specified chronic obstructive pulmonary disease: Secondary | ICD-10-CM | POA: Diagnosis present

## 2012-02-06 DIAGNOSIS — G4700 Insomnia, unspecified: Secondary | ICD-10-CM

## 2012-02-06 LAB — BASIC METABOLIC PANEL
Calcium: 8.9 mg/dL (ref 8.4–10.5)
GFR calc non Af Amer: 85 mL/min — ABNORMAL LOW (ref >90)
Sodium: 138 mEq/L (ref 135–145)

## 2012-02-06 LAB — URINE MICROSCOPIC-ADD ON

## 2012-02-06 LAB — CBC WITH DIFFERENTIAL/PLATELET
Eosinophils Absolute: 0.3 10*3/uL (ref 0.0–0.7)
Lymphocytes Relative: 15 % (ref 12–46)
Lymphs Abs: 1.4 10*3/uL (ref 0.7–4.0)
Neutro Abs: 7.1 10*3/uL (ref 1.7–7.7)
Neutrophils Relative %: 77 % (ref 43–77)
Platelets: 204 10*3/uL (ref 150–400)
RBC: 4.32 MIL/uL (ref 4.22–5.81)
WBC: 9.2 10*3/uL (ref 4.0–10.5)

## 2012-02-06 LAB — URINALYSIS, ROUTINE W REFLEX MICROSCOPIC
Glucose, UA: 100 mg/dL — AB
Protein, ur: 100 mg/dL — AB
Specific Gravity, Urine: 1.017 (ref 1.005–1.030)
Urobilinogen, UA: 0.2 mg/dL (ref 0.0–1.0)

## 2012-02-06 MED ORDER — DEXTROSE 5 % IV SOLN
1.0000 g | Freq: Once | INTRAVENOUS | Status: AC
  Administered 2012-02-07: 1 g via INTRAVENOUS
  Filled 2012-02-06: qty 10

## 2012-02-06 MED ORDER — HYDROMORPHONE HCL PF 1 MG/ML IJ SOLN
0.5000 mg | Freq: Once | INTRAMUSCULAR | Status: AC
  Administered 2012-02-06: 0.5 mg via INTRAVENOUS
  Filled 2012-02-06: qty 1

## 2012-02-06 MED ORDER — ONDANSETRON HCL 4 MG/2ML IJ SOLN
4.0000 mg | Freq: Once | INTRAMUSCULAR | Status: AC
  Administered 2012-02-06: 4 mg via INTRAVENOUS
  Filled 2012-02-06: qty 2

## 2012-02-06 NOTE — ED Provider Notes (Signed)
History     CSN: [id number]  Arrival date & time 02/06/12  2007   First MD Initiated Contact with Patient 02/06/12 2017      Chief Complaint  Patient presents with  . Dysuria    (Consider location/radiation/quality/duration/timing/severity/associated sxs/prior treatment) Patient is a 78 y.o. male presenting with dysuria. The history is provided by the patient. The history is limited by a language barrier. A language interpreter was used.  Dysuria  This is a new problem. The current episode started 3 to 5 hours ago. The problem occurs every urination. The problem has not changed since onset.The pain is severe. There has been no fever. Associated symptoms include hematuria and hesitancy. Pertinent negatives include no chills, no nausea, no vomiting and no flank pain. He has tried nothing for the symptoms.    Past Medical History  Diagnosis Date  . Diabetes mellitus   . Hyperlipidemia   . Hypertension     No past surgical history on file.  No family history on file.  History  Substance Use Topics  . Smoking status: Never Smoker   . Smokeless tobacco: Not on file  . Alcohol Use: No      Review of Systems  Constitutional: Negative for fever and chills.  HENT: Negative.   Eyes: Negative.   Respiratory: Negative for cough and shortness of breath.   Cardiovascular: Negative for chest pain and palpitations.  Gastrointestinal: Positive for abdominal pain (suprapubic). Negative for nausea, vomiting, diarrhea and constipation.  Genitourinary: Positive for dysuria, hesitancy and hematuria. Negative for flank pain.  Musculoskeletal: Negative.   Skin: Negative.   Neurological: Negative.   All other systems reviewed and are negative.    Allergies  Review of patient's allergies indicates no known allergies.  Home Medications   Current Outpatient Rx  Name Route Sig Dispense Refill  . FLUTICASONE-SALMETEROL 500-50 MCG/DOSE IN AEPB Inhalation Inhale 1 puff into the lungs 2  (two) times daily. 60 each 11  . GABAPENTIN 100 MG PO CAPS Oral Take 1 capsule (100 mg total) by mouth 3 (three) times daily. 90 capsule 3  . LISINOPRIL-HYDROCHLOROTHIAZIDE 10-12.5 MG PO TABS Oral Take 1 tablet by mouth daily.    Marland Kitchen METFORMIN HCL 500 MG PO TABS Oral Take 1 tablet (500 mg total) by mouth 2 (two) times daily with a meal. 1 tablet by mouth twice daily for diabetes 60 tablet 5  . MONTELUKAST SODIUM 10 MG PO TABS  take 1 tablet by mouth once daily 30 tablet 11  . OMEPRAZOLE 20 MG PO CPDR Oral Take 1 capsule (20 mg total) by mouth daily. 30 capsule 6  . PRAVASTATIN SODIUM 40 MG PO TABS Oral Take 1 tablet (40 mg total) by mouth daily. 30 tablet 5  . TIOTROPIUM BROMIDE MONOHYDRATE 18 MCG IN CAPS Inhalation Place 1 capsule (18 mcg total) into inhaler and inhale daily. 1 cap inhaled daily 30 capsule 11  . ALBUTEROL SULFATE HFA 108 (90 BASE) MCG/ACT IN AERS  Inhale 1-2 puffs every 4 hours as needed for breathing difficulties       BP 146/84  Pulse 106  Temp 97.6 F (36.4 C) (Oral)  Resp 16  SpO2 98%  Physical Exam  Nursing note and vitals reviewed. Constitutional: He is oriented to person, place, and time. He appears well-developed and well-nourished. No distress.  HENT:  Head: Normocephalic and atraumatic.  Eyes: Conjunctivae are normal.  Neck: Neck supple.  Cardiovascular: Normal rate, regular rhythm, normal heart sounds and intact distal pulses.  Pulmonary/Chest: Effort normal and breath sounds normal. He has no wheezes. He has no rales.  Abdominal: Soft. He exhibits no distension. There is tenderness in the suprapubic area. There is no CVA tenderness.  Genitourinary: Testes normal and penis normal.  Musculoskeletal: Normal range of motion.  Neurological: He is alert and oriented to person, place, and time.  Skin: Skin is warm and dry.    ED Course  Procedures (including critical care time)  Labs Reviewed  URINALYSIS, ROUTINE W REFLEX MICROSCOPIC - Abnormal; Notable for  the following:    Color, Urine RED (*)  BIOCHEMICALS MAY BE AFFECTED BY COLOR   APPearance TURBID (*)     Glucose, UA 100 (*)     Hgb urine dipstick LARGE (*)     Protein, ur 100 (*)     Leukocytes, UA LARGE (*)     All other components within normal limits  BASIC METABOLIC PANEL - Abnormal; Notable for the following:    Glucose, Bld 195 (*)     GFR calc non Af Amer 85 (*)     All other components within normal limits  CBC WITH DIFFERENTIAL - Abnormal; Notable for the following:    Hemoglobin 12.4 (*)     HCT 37.0 (*)     All other components within normal limits  URINE MICROSCOPIC-ADD ON - Abnormal; Notable for the following:    Squamous Epithelial / LPF FEW (*)     Bacteria, UA MANY (*)     All other components within normal limits   Ct Abdomen Pelvis Wo Contrast  02/06/2012  *RADIOLOGY REPORT*  Clinical Data: Abdominal pain, hematuria.  CT ABDOMEN AND PELVIS WITHOUT CONTRAST  Technique:  Multidetector CT imaging of the abdomen and pelvis was performed following the standard protocol without intravenous contrast.  Comparison: 08/11/2006 PET-CT  Findings: Mild dependent atelectasis.  Cardiomegaly.  Organ abnormality/lesion detection is limited in the absence of intravenous contrast. Within this limitation, multiple hypodensities within the liver are similar to prior though remain incomplete characterized.  There is a punctate calcification at the hepatic dome.  Unremarkable biliary system, spleen, adrenal glands. There is mild motion artifact which degrades the evaluation of the pancreas.  No overt abnormality identified.  Symmetric renal size.  There is a 1 cm hypodensity within the right kidney, similar to prior, incomplete characterized. No hydronephrosis or hydroureter.  No CT evidence for colitis.  Bilateral colonic containing inguinal hernias.  No bowel obstruction.  Normal appendix.  No free intraperitoneal air or fluid.  Ectatic aorta and dilatation of the common iliac arteries.   Further vascular evaluation is not possible in the absence of intravenous contrast.  The bladder is distended with circumferential thickening.  There is air non dependently and along the periphery of the bladder which may be within the wall.  Nonspecific prostatic calcifications.  Multilevel degenerative changes.  No acute osseous finding.  IMPRESSION: Circumferential bladder wall thickening with air non dependently and along the periphery, which may be within the wall as can be seen with emphysematous cystitis.  Alternate consideration would be a communication with bowel though none is identified.  Recommend urologic consultation.  Discussed via telephone with Dr. Christain Sacramento at 10:35 p.m. on 02/06/2012.   Original Report Authenticated By: Waneta Martins, M.D.      1. Emphysematous cystitis   2. UTI (lower urinary tract infection)       MDM  78 yo [place] male with PMHx of DM, HTN, HLD who presents for several hours  of dysuria, hematuria, and suprapubic pain.  History obtained through phone interpreter.  No fever, back pain, nausea, vomiting, testicular pain or swelling.  AF, VSS.  Pt appears to be very uncomfortable on stretcher.  Physical exam remarkable for suprapubic TTP.  No CVA tenderness.  Nml GU exam.  Presentation concerning for UTI, renal stone, obstruction.  Would also consider aortic pathology in DDx.  Will get labs and CT abd/plevis w/o contrast.  Giving antiemetics and pain meds.  UA consistent with UTI.  CT abdomen/pelvis showed concern for emphysematous cystitis.  Will give Rocephin in the ED and admit to [facility] for further management.        Cherre Robins, MD 02/07/12 [phone]

## 2012-02-06 NOTE — ED Notes (Signed)
Patient transported to CT 

## 2012-02-06 NOTE — ED Notes (Signed)
All questions and answers were done with the use of the interpretation service.  The patient advised that he has had pain with urination since yesterday.  Today, he noticed bleeding when he urinated.  He states that he "has not been able to urinate since 1400 because it hurts too much."  The patient denies any prior history of this problem of of prostatitis.  He states that he went to his PCP recently for pain and that he was prescribed "diabetes medicine."

## 2012-02-06 NOTE — ED Notes (Signed)
MD at bedside. 

## 2012-02-07 ENCOUNTER — Encounter (HOSPITAL_COMMUNITY): Payer: Self-pay | Admitting: *Deleted

## 2012-02-07 DIAGNOSIS — R109 Unspecified abdominal pain: Secondary | ICD-10-CM

## 2012-02-07 DIAGNOSIS — N3080 Other cystitis without hematuria: Secondary | ICD-10-CM | POA: Diagnosis present

## 2012-02-07 DIAGNOSIS — C679 Malignant neoplasm of bladder, unspecified: Secondary | ICD-10-CM

## 2012-02-07 DIAGNOSIS — N390 Urinary tract infection, site not specified: Secondary | ICD-10-CM

## 2012-02-07 LAB — GLUCOSE, CAPILLARY
Glucose-Capillary: 135 mg/dL — ABNORMAL HIGH (ref 70–99)
Glucose-Capillary: 116 mg/dL — ABNORMAL HIGH (ref 70–99)
Glucose-Capillary: 181 mg/dL — ABNORMAL HIGH (ref 70–99)

## 2012-02-07 MED ORDER — ACETAMINOPHEN 325 MG PO TABS: 650.0000 mg | ORAL_TABLET | Freq: Four times a day (QID) | ORAL | Status: DC | PRN

## 2012-02-07 MED ORDER — OXYCODONE HCL 5 MG PO TABS
5.0000 mg | ORAL_TABLET | ORAL | Status: DC | PRN
  Administered 2012-02-07 – 2012-02-09 (×4): 5 mg via ORAL
  Filled 2012-02-07 (×5): qty 1

## 2012-02-07 MED ORDER — LISINOPRIL-HYDROCHLOROTHIAZIDE 10-12.5 MG PO TABS: 1.0000 | ORAL_TABLET | Freq: Every day | ORAL | Status: DC

## 2012-02-07 MED ORDER — SODIUM CHLORIDE 0.9 % IV SOLN: 250.0000 mL | INTRAVENOUS | Status: DC | PRN

## 2012-02-07 MED ORDER — ACETAMINOPHEN 650 MG RE SUPP: 650.0000 mg | Freq: Four times a day (QID) | RECTAL | Status: DC | PRN

## 2012-02-07 MED ORDER — SODIUM CHLORIDE 0.9 % IJ SOLN
3.0000 mL | Freq: Two times a day (BID) | INTRAMUSCULAR | Status: DC
  Administered 2012-02-07 – 2012-02-09 (×6): 3 mL via INTRAVENOUS

## 2012-02-07 MED ORDER — MORPHINE SULFATE 2 MG/ML IJ SOLN
2.0000 mg | INTRAMUSCULAR | Status: DC | PRN
  Administered 2012-02-08: 2 mg via INTRAVENOUS
  Filled 2012-02-07: qty 1

## 2012-02-07 MED ORDER — DEXTROSE 5 % IV SOLN
1.0000 g | INTRAVENOUS | Status: DC
  Administered 2012-02-07 – 2012-02-09 (×3): 1 g via INTRAVENOUS
  Filled 2012-02-07 (×4): qty 10

## 2012-02-07 MED ORDER — SODIUM CHLORIDE 0.9 % IJ SOLN: 3.0000 mL | INTRAMUSCULAR | Status: DC | PRN

## 2012-02-07 MED ORDER — ONDANSETRON HCL 4 MG PO TABS: 4.0000 mg | ORAL_TABLET | Freq: Four times a day (QID) | ORAL | Status: DC | PRN

## 2012-02-07 MED ORDER — LISINOPRIL 10 MG PO TABS
10.0000 mg | ORAL_TABLET | Freq: Every day | ORAL | Status: DC
  Administered 2012-02-07 – 2012-02-10 (×4): 10 mg via ORAL
  Filled 2012-02-07 (×4): qty 1

## 2012-02-07 MED ORDER — DOCUSATE SODIUM 100 MG PO CAPS
100.0000 mg | ORAL_CAPSULE | Freq: Every day | ORAL | Status: DC
  Administered 2012-02-07 – 2012-02-09 (×3): 100 mg via ORAL
  Filled 2012-02-07 (×3): qty 1

## 2012-02-07 MED ORDER — INSULIN ASPART 100 UNIT/ML ~~LOC~~ SOLN
0.0000 [IU] | Freq: Three times a day (TID) | SUBCUTANEOUS | Status: DC
  Administered 2012-02-07: 1 [IU] via SUBCUTANEOUS
  Administered 2012-02-07: 2 [IU] via SUBCUTANEOUS
  Administered 2012-02-08: 1 [IU] via SUBCUTANEOUS
  Administered 2012-02-08: 2 [IU] via SUBCUTANEOUS
  Administered 2012-02-08: 1 [IU] via SUBCUTANEOUS
  Administered 2012-02-09: 2 [IU] via SUBCUTANEOUS
  Administered 2012-02-09 (×2): 1 [IU] via SUBCUTANEOUS

## 2012-02-07 MED ORDER — PANTOPRAZOLE SODIUM 40 MG PO TBEC
40.0000 mg | DELAYED_RELEASE_TABLET | Freq: Every day | ORAL | Status: DC
  Administered 2012-02-07 – 2012-02-09 (×3): 40 mg via ORAL
  Filled 2012-02-07 (×2): qty 1

## 2012-02-07 MED ORDER — HYDROCHLOROTHIAZIDE 12.5 MG PO CAPS
12.5000 mg | ORAL_CAPSULE | Freq: Every day | ORAL | Status: DC
  Administered 2012-02-07 – 2012-02-10 (×4): 12.5 mg via ORAL
  Filled 2012-02-07 (×4): qty 1

## 2012-02-07 MED ORDER — HEPARIN SODIUM (PORCINE) 5000 UNIT/ML IJ SOLN
5000.0000 [IU] | Freq: Three times a day (TID) | INTRAMUSCULAR | Status: DC
  Administered 2012-02-07 – 2012-02-10 (×9): 5000 [IU] via SUBCUTANEOUS
  Filled 2012-02-07 (×13): qty 1

## 2012-02-07 MED ORDER — ONDANSETRON HCL 4 MG/2ML IJ SOLN: 4.0000 mg | Freq: Four times a day (QID) | INTRAMUSCULAR | Status: DC | PRN

## 2012-02-07 MED ORDER — GABAPENTIN 100 MG PO CAPS
100.0000 mg | ORAL_CAPSULE | Freq: Three times a day (TID) | ORAL | Status: DC
  Administered 2012-02-07 – 2012-02-10 (×10): 100 mg via ORAL
  Filled 2012-02-07 (×12): qty 1

## 2012-02-07 MED ORDER — ALBUTEROL SULFATE HFA 108 (90 BASE) MCG/ACT IN AERS: 2.0000 | INHALATION_SPRAY | RESPIRATORY_TRACT | Status: DC | PRN

## 2012-02-07 MED ORDER — POLYETHYLENE GLYCOL 3350 17 G PO PACK
17.0000 g | PACK | Freq: Every day | ORAL | Status: DC
  Administered 2012-02-07 – 2012-02-09 (×3): 17 g via ORAL
  Filled 2012-02-07 (×4): qty 1

## 2012-02-07 MED ORDER — TIOTROPIUM BROMIDE MONOHYDRATE 18 MCG IN CAPS
18.0000 ug | ORAL_CAPSULE | Freq: Every day | RESPIRATORY_TRACT | Status: DC
  Administered 2012-02-07 – 2012-02-10 (×4): 18 ug via RESPIRATORY_TRACT
  Filled 2012-02-07: qty 5

## 2012-02-07 MED ORDER — SIMVASTATIN 20 MG PO TABS
20.0000 mg | ORAL_TABLET | Freq: Every day | ORAL | Status: DC
  Administered 2012-02-07 – 2012-02-09 (×3): 20 mg via ORAL
  Filled 2012-02-07 (×4): qty 1

## 2012-02-07 NOTE — H&P (Signed)
Seen and examined.  Discussed and agree with Dr. Fara Boros.  Briefly 78 yo male with lower abd pain and dysuria.  Issues: 1. emphysematous cystitis:  Complicated UTI.  Lack of fever, WBC, vomiting or systemic signs suggests only lower track disease.  Most common predisposing factor to UTI in elderly males in decreased emptying from bladder outlet obstruction.  Agree with Rx.  Will get uro consult.  Consider empiric flomax Rx - but we can wait for bladder scan results

## 2012-02-07 NOTE — H&P (Signed)
Nathaniel [name] is an 78 y.o. male.    PCP: [place] (Dr. 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  Interpreter phone was used.  Patient reports some mild discomfort with urination on and off for the past month.  This morning, it became slightly worse but was tolerable.  By 4pm (yesterday), pain became very severe and unbearable.  Pain is present all of the time but is worse with urinating.  He did notice that his urine looked dark and red.  Other than lower abdominal discomfort, he feels well.  He denies N/V/D, no fevers/chills, no chest pain or SOB, no dizziness or weakness.  He has been taking his medications for his DM and HTN as Rx'ed.  Patient does not report any history of BPH or difficulties with urinating, having a strong stream, or staring and stopping when urinating.  He feels like he empties his bladder fully when he urinates.   While in the ED, pt was given CTX 1gm, dilaudid 0.5mg  x1 and zofran 4mg  x1.   Past Medical History  Diagnosis Date  . Diabetes mellitus   . Hyperlipidemia   . Hypertension     No past surgical history on file.  No family history on file. Social History:  reports that he has never smoked. He does not have any smokeless tobacco history on file. He reports that he does not drink alcohol or use illicit drugs.  Allergies: No Known Allergies   (Not in a hospital admission)  Results for orders placed during the hospital encounter of 02/06/12 (from the past 48 hour(s))  URINALYSIS, ROUTINE W REFLEX MICROSCOPIC     Status: Abnormal   Collection Time   02/06/12  9:54 PM      Component Value Range Comment   Color, Urine RED (*) YELLOW BIOCHEMICALS MAY BE AFFECTED BY COLOR   APPearance TURBID (*) CLEAR    Specific Gravity, Urine 1.017  1.005 - 1.030    pH 6.0  5.0 - 8.0    Glucose, UA 100 (*)  NEGATIVE mg/dL    Hgb urine dipstick LARGE (*) NEGATIVE    Bilirubin Urine NEGATIVE  NEGATIVE    Ketones, ur NEGATIVE  NEGATIVE mg/dL    Protein, ur 161 (*) NEGATIVE mg/dL    Urobilinogen, UA 0.2  0.0 - 1.0 mg/dL    Nitrite NEGATIVE  NEGATIVE    Leukocytes, UA LARGE (*) NEGATIVE   URINE MICROSCOPIC-ADD ON     Status: Abnormal   Collection Time   02/06/12  9:54 PM      Component Value Range Comment   Squamous Epithelial / LPF FEW (*) RARE    WBC, UA TOO NUMEROUS TO COUNT  <3 WBC/hpf    RBC / HPF TOO NUMEROUS TO COUNT  <3 RBC/hpf    Bacteria, UA MANY (*) RARE   BASIC METABOLIC PANEL     Status: Abnormal   Collection Time   02/06/12  9:55 PM      Component Value Range Comment   Sodium 138  135 - 145 mEq/L    Potassium 4.4  3.5 - 5.1 mEq/L    Chloride 103  96 - 112 mEq/L    CO2 26  19 - 32 mEq/L    Glucose, Bld 195 (*) 70 - 99 mg/dL    BUN 18  6 - 23 mg/dL  Creatinine, Ser 0.80  0.50 - 1.35 mg/dL    Calcium 8.9  8.4 - 16.1 mg/dL    GFR calc non Af Amer 85 (*) >90 mL/min    GFR calc Af Amer >90  >90 mL/min   CBC WITH DIFFERENTIAL     Status: Abnormal   Collection Time   02/06/12  9:55 PM      Component Value Range Comment   WBC 9.2  4.0 - 10.5 K/uL    RBC 4.32  4.22 - 5.81 MIL/uL    Hemoglobin 12.4 (*) 13.0 - 17.0 g/dL    HCT 09.6 (*) 04.5 - 52.0 %    MCV 85.6  78.0 - 100.0 fL    MCH 28.7  26.0 - 34.0 pg    MCHC 33.5  30.0 - 36.0 g/dL    RDW 40.9  81.1 - 91.4 %    Platelets 204  150 - 400 K/uL    Neutrophils Relative 77  43 - 77 %    Neutro Abs 7.1  1.7 - 7.7 K/uL    Lymphocytes Relative 15  12 - 46 %    Lymphs Abs 1.4  0.7 - 4.0 K/uL    Monocytes Relative 5  3 - 12 %    Monocytes Absolute 0.4  0.1 - 1.0 K/uL    Eosinophils Relative 3  0 - 5 %    Eosinophils Absolute 0.3  0.0 - 0.7 K/uL    Basophils Relative 0  0 - 1 %    Basophils Absolute 0.0  0.0 - 0.1 K/uL    Ct Abdomen Pelvis Wo Contrast  02/06/2012  *RADIOLOGY REPORT*  Clinical Data: Abdominal pain, hematuria.   CT ABDOMEN AND PELVIS WITHOUT CONTRAST  Technique:  Multidetector CT imaging of the abdomen and pelvis was performed following the standard protocol without intravenous contrast.  Comparison: 08/11/2006 PET-CT  Findings: Mild dependent atelectasis.  Cardiomegaly.  Organ abnormality/lesion detection is limited in the absence of intravenous contrast. Within this limitation, multiple hypodensities within the liver are similar to prior though remain incomplete characterized.  There is a punctate calcification at the hepatic dome.  Unremarkable biliary system, spleen, adrenal glands. There is mild motion artifact which degrades the evaluation of the pancreas.  No overt abnormality identified.  Symmetric renal size.  There is a 1 cm hypodensity within the right kidney, similar to prior, incomplete characterized. No hydronephrosis or hydroureter.  No CT evidence for colitis.  Bilateral colonic containing inguinal hernias.  No bowel obstruction.  Normal appendix.  No free intraperitoneal air or fluid.  Ectatic aorta and dilatation of the common iliac arteries.  Further vascular evaluation is not possible in the absence of intravenous contrast.  The bladder is distended with circumferential thickening.  There is air non dependently and along the periphery of the bladder which may be within the wall.  Nonspecific prostatic calcifications.  Multilevel degenerative changes.  No acute osseous finding.  IMPRESSION: Circumferential bladder wall thickening with air non dependently and along the periphery, which may be within the wall as can be seen with emphysematous cystitis.  Alternate consideration would be a communication with bowel though none is identified.  Recommend urologic consultation.  Discussed via telephone with Dr. Christain Sacramento at 10:35 p.m. on 02/06/2012.   Original Report Authenticated By: Waneta Martins, M.D.     Review of Systems  Constitutional: Negative for fever and chills.  HENT: Negative for sore throat.    Eyes: Negative for blurred vision.  Respiratory: Negative  for cough and shortness of breath.   Cardiovascular: Negative for chest pain and palpitations.  Gastrointestinal: Positive for abdominal pain. Negative for nausea, vomiting, diarrhea and constipation.  Genitourinary: Positive for dysuria and hematuria. Negative for frequency and flank pain.  Musculoskeletal: Negative for back pain and falls.  Skin: Negative for rash.  Neurological: Negative for dizziness, weakness and headaches.    Blood pressure 146/84, pulse 106, temperature 97.6 F (36.4 C), temperature source Oral, resp. rate 16, SpO2 98.00%. Physical Exam  Constitutional: He is oriented to person, place, and time. He appears well-developed and well-nourished. No distress.  HENT:  Head: Normocephalic and atraumatic.  Mouth/Throat: Oropharynx is clear and moist. No oropharyngeal exudate.  Eyes: Conjunctivae are normal. Pupils are equal, round, and reactive to light.  Neck: Normal range of motion. Neck supple.  Cardiovascular: Normal rate, regular rhythm and intact distal pulses.   No murmur heard. Respiratory: Effort normal. No respiratory distress. He has wheezes.  GI: Soft. Bowel sounds are normal. He exhibits no distension and no ascites. There is tenderness in the periumbilical area and suprapubic area. There is guarding. There is no rebound and no CVA tenderness.  Genitourinary: Rectum normal and penis normal. Rectal exam shows no mass, no tenderness and anal tone normal. Prostate is enlarged. Prostate is not tender.       Prostate mildly enlarged but not boggy or tender  Musculoskeletal: Normal range of motion. He exhibits no edema.  Lymphadenopathy:    He has no cervical adenopathy.  Neurological: He is alert and oriented to person, place, and time.  Skin: Skin is warm and dry. He is not diaphoretic.     Assessment/Plan 78 yo M w/ DM, HTN, HLD p/w dysuria found to have UTI and emphysematous cystitis on CT  scan  1. Emphysematous cystitis/UTI: - Was able to speak briefly to urologist on call-- they will see pt in AM; no need for any immediate intervention, just advised to continue antibiotics - Continue CTX 1gm q24hr IV, s/p 1 dose in ED (it appears the most common organisms are E coli and Klebsiella) - Morphine PRN pain - Consider foley if pt having a difficult time urinating-- could be 2/2 pain or could be due to blood clots in bladder, which is a common complication that sometimes requires bladder irrigation   2. HTN: BPs slightly hypertensive, but pt also uncomfortable - Will continue home regimen for now (lisinopril/HCTZ 10/12.5) - Monitor for improvement with pain control and treatment of cystitis   3. DM: last A1c 7.2 (01/03/12) - Hold home regimen (metformin 500 BID) - SSI while in house - Recently started on neurontin for neuropathy, will continue   4. HLD: - Continue statin while in house  5. GERD: - Started on Prilosec recently, will continue protonix while in house  6. COPD: satting well on RA - Continue home spiriva, albuterol PRN  FEN/GI: heart healthy, carb mod diet; SLIV Ppx: SQ heparin, protonix  Dispo: admit to inpatient, d/c pending clinical improvement    Nathaniel [name] 02/07/2012, 12:55 AM

## 2012-02-07 NOTE — Progress Notes (Signed)
Patient ID: Nathaniel [name], male   DOB: [birth year], 78 y.o.   MRN: [id number]  Interval progress note Family Medicine Resident [phone]  Patient states doing well this morning.  States peeing better today, complaining of only a little bit of pain.  Denies blood in urine. Plan to continue current management.  Plan:  1. Emphysematous cystitis/UTI:  - Urologist saw patient this morning and rec's include post-void residual, f/u on the urine culture, and continue antibiotics.  Will f/u on note.  - Continue CTX 1gm q24hr IV, s/p 1 dose in ED (it appears the most common organisms are E coli and Klebsiella)  - Morphine PRN pain  - Consider foley if pt having a difficult time urinating-- could be 2/2 pain or could be due to blood clots in bladder, which is a common complication that sometimes requires bladder irrigation   2. HTN: BPs slightly hypertensive, but pt also uncomfortable  - Will continue home regimen for now (lisinopril/HCTZ 10/12.5)  - Monitor for improvement with pain control and treatment of cystitis   3. DM: last A1c 7.2 (01/03/12)  - Hold home regimen (metformin 500 BID)  - SSI while in house  - Recently started on neurontin for neuropathy, will continue   4. HLD:  - Continue statin while in house   5. GERD:  - Started on Prilosec recently, will continue protonix while in house   6. COPD: satting well on RA  - Continue home spiriva, albuterol PRN   FEN/GI: heart healthy, carb mod diet; SLIV  Ppx: SQ heparin, protonix  Dispo: admit to inpatient, d/c pending clinical improvement

## 2012-02-07 NOTE — Progress Notes (Signed)
FMTS Attending Daily Note: Napoleon Monacelli MD [phone] pager office [phone] I have discussed this patient with the resident and reviewed the assessment and plan as documented above. I agree wit the resident's findings and plan.  

## 2012-02-07 NOTE — ED Provider Notes (Signed)
I saw and evaluated the patient, reviewed the resident's note and I agree with the findings and plan.  suprapoubic pain with urinary symptoms.  Vitals stable, smiling, no distress.  TTP suprapubic without radiation. NO neuro or pulse deficits.  Glynn Octave, MD 02/07/12 1230

## 2012-02-07 NOTE — Consult Note (Addendum)
Urology Consult  Referring physician: Dr Demetria Pore Reason for referral: UTI  Chief Complaint:Dysuria, suprapubic pain  History of Present Illness: Nathaniel [name] is a 78 years old male who presented to the ER on 8/25 with  One month history of on and off dysuria, suprapubic pain.  The pain is constant but worse with urination.  It gradually became worse yesterday and went to the ER.  CT scan showed a distended bladder and air in the bladder, suggestive of emphysematous cystitis.  He does not have any fever.  He has a history of diabetes.  Urinalysis showed TNTC WBC's, TNTC RBC's,  many bacteria.  He was started on Ceftriaxone.  PVR : 300 ml.  Past Medical History  Diagnosis Date  . Diabetes mellitus   . Hyperlipidemia   . Hypertension    No past surgical history on file.  Medications:Albuterol, Ventolin. Lisinopril/HCTZ,Metformin, Singular, Prilosec, Pravastatin, Spiriva.2 Allergies: No Known Allergies  No family history on file. Social History:  reports that he has never smoked. He does not have any smokeless tobacco history on file. He reports that he does not drink alcohol or use illicit drugs.  ROS: All systems are reviewed and negative except as noted.   Physical Exam:  Vital signs in last 24 hours: Temp:  [97.4 F (36.3 C)-98.7 F (37.1 C)] 97.6 F (36.4 C) (08/26 1338) Pulse Rate:  [64-106] 72  (08/26 1338) Resp:  [16-26] 20  (08/26 1338) BP: (121-162)/(71-88) 136/88 mmHg (08/26 1338) SpO2:  [97 %-100 %] 100 % (08/26 1338) Weight:  [109 lb 8 oz (49.669 kg)] 109 lb 8 oz (49.669 kg) (08/26 0346) Ears, Nose, Throat: Within normal limits Neck: Supple, no cervical adenopathy, no thyromegaly. Cardiovascular: Skin warm; not flushed Respiratory: Breaths quiet; no shortness of breath Abdomen: No masses, tender in the suprapubic area. Neurological: Normal sensation to touch Musculoskeletal: Normal motor function arms and legs Lymphatics: No inguinal adenopathy Skin: No  rashes Genitourinary:Penis is uncircumcised.  Meatus is normal.                        Scrotum is normal in appearance.  There is no testicular mass. Rectal: Sphincter tone is normal.  Prostate is moderately enlarged 30 gm, no nodules.  Laboratory Data:  Results for orders placed during the hospital encounter of 02/06/12 (from the past 72 hour(s))  URINALYSIS, ROUTINE W REFLEX MICROSCOPIC     Status: Abnormal   Collection Time   02/06/12  9:54 PM      Component Value Range Comment   Color, Urine RED (*) YELLOW BIOCHEMICALS MAY BE AFFECTED BY COLOR   APPearance TURBID (*) CLEAR    Specific Gravity, Urine 1.017  1.005 - 1.030    pH 6.0  5.0 - 8.0    Glucose, UA 100 (*) NEGATIVE mg/dL    Hgb urine dipstick LARGE (*) NEGATIVE    Bilirubin Urine NEGATIVE  NEGATIVE    Ketones, ur NEGATIVE  NEGATIVE mg/dL    Protein, ur 161 (*) NEGATIVE mg/dL    Urobilinogen, UA 0.2  0.0 - 1.0 mg/dL    Nitrite NEGATIVE  NEGATIVE    Leukocytes, UA LARGE (*) NEGATIVE   URINE MICROSCOPIC-ADD ON     Status: Abnormal   Collection Time   02/06/12  9:54 PM      Component Value Range Comment   Squamous Epithelial / LPF FEW (*) RARE    WBC, UA TOO NUMEROUS TO COUNT  <3 WBC/hpf  RBC / HPF TOO NUMEROUS TO COUNT  <3 RBC/hpf    Bacteria, UA MANY (*) RARE   BASIC METABOLIC PANEL     Status: Abnormal   Collection Time   02/06/12  9:55 PM      Component Value Range Comment   Sodium 138  135 - 145 mEq/L    Potassium 4.4  3.5 - 5.1 mEq/L    Chloride 103  96 - 112 mEq/L    CO2 26  19 - 32 mEq/L    Glucose, Bld 195 (*) 70 - 99 mg/dL    BUN 18  6 - 23 mg/dL    Creatinine, Ser 0.45  0.50 - 1.35 mg/dL    Calcium 8.9  8.4 - 40.9 mg/dL    GFR calc non Af Amer 85 (*) >90 mL/min    GFR calc Af Amer >90  >90 mL/min   CBC WITH DIFFERENTIAL     Status: Abnormal   Collection Time   02/06/12  9:55 PM      Component Value Range Comment   WBC 9.2  4.0 - 10.5 K/uL    RBC 4.32  4.22 - 5.81 MIL/uL    Hemoglobin 12.4 (*) 13.0 -  17.0 g/dL    HCT 81.1 (*) 91.4 - 52.0 %    MCV 85.6  78.0 - 100.0 fL    MCH 28.7  26.0 - 34.0 pg    MCHC 33.5  30.0 - 36.0 g/dL    RDW 78.2  95.6 - 21.3 %    Platelets 204  150 - 400 K/uL    Neutrophils Relative 77  43 - 77 %    Neutro Abs 7.1  1.7 - 7.7 K/uL    Lymphocytes Relative 15  12 - 46 %    Lymphs Abs 1.4  0.7 - 4.0 K/uL    Monocytes Relative 5  3 - 12 %    Monocytes Absolute 0.4  0.1 - 1.0 K/uL    Eosinophils Relative 3  0 - 5 %    Eosinophils Absolute 0.3  0.0 - 0.7 K/uL    Basophils Relative 0  0 - 1 %    Basophils Absolute 0.0  0.0 - 0.1 K/uL   GLUCOSE, CAPILLARY     Status: Abnormal   Collection Time   02/07/12  7:42 AM      Component Value Range Comment   Glucose-Capillary 135 (*) 70 - 99 mg/dL    Comment 1 Notify RN     GLUCOSE, CAPILLARY     Status: Abnormal   Collection Time   02/07/12 12:37 PM      Component Value Range Comment   Glucose-Capillary 174 (*) 70 - 99 mg/dL    Comment 1 Notify RN     GLUCOSE, CAPILLARY     Status: Abnormal   Collection Time   02/07/12  5:15 PM      Component Value Range Comment   Glucose-Capillary 116 (*) 70 - 99 mg/dL    Comment 1 Notify RN      No results found for this or any previous visit (from the past 240 hour(s)). Creatinine:  Basename 02/06/12 2155  CREATININE 0.80    I have independently reviewed the CT scan and the findings are as noted above.  Impression/Assessment:   UTI.  BPH. Urinary retention.   Need to rule out vesico colonic fistula.  Plan:  Insert Foley catheter.  Continue Ceftriaxone till culture results are available. CT cystogram to rule  out vesico colonic fistula. Will follow patient with you.   Richele Strand-HENRY 02/07/2012, 8:08 PM

## 2012-02-08 ENCOUNTER — Inpatient Hospital Stay (HOSPITAL_COMMUNITY): Payer: Medicare Other

## 2012-02-08 ENCOUNTER — Encounter (HOSPITAL_COMMUNITY): Payer: Self-pay | Admitting: *Deleted

## 2012-02-08 LAB — CBC
HCT: 36 % — ABNORMAL LOW (ref 39.0–52.0)
MCHC: 33.1 g/dL (ref 30.0–36.0)
Platelets: 206 10*3/uL (ref 150–400)
RDW: 13.5 % (ref 11.5–15.5)
WBC: 8.6 10*3/uL (ref 4.0–10.5)

## 2012-02-08 LAB — GLUCOSE, CAPILLARY
Glucose-Capillary: 138 mg/dL — ABNORMAL HIGH (ref 70–99)
Glucose-Capillary: 151 mg/dL — ABNORMAL HIGH (ref 70–99)
Glucose-Capillary: 198 mg/dL — ABNORMAL HIGH (ref 70–99)

## 2012-02-08 LAB — BASIC METABOLIC PANEL
BUN: 17 mg/dL (ref 6–23)
Chloride: 101 mEq/L (ref 96–112)
GFR calc Af Amer: >90 mL/min (ref >90)
GFR calc non Af Amer: 82 mL/min — ABNORMAL LOW (ref >90)
Potassium: 4 mEq/L (ref 3.5–5.1)
Sodium: 138 mEq/L (ref 135–145)

## 2012-02-08 MED ORDER — IOHEXOL 300 MG/ML  SOLN
80.0000 mL | Freq: Once | INTRAMUSCULAR | Status: AC | PRN
  Administered 2012-02-08: 80 mL via INTRAVENOUS

## 2012-02-08 NOTE — Progress Notes (Signed)
Afebrile.  V/S stable Foley draining clear urine.   No suprapubic pain Urine culture: pending. Suggest: CT cystogram to rule out vesico colonic fistula, although I doubt it.

## 2012-02-08 NOTE — Progress Notes (Signed)
Patient ID: Nathaniel [name], male   DOB: 02-22-1934, 78 y.o.   MRN: [id number] Family Medicine Teaching Service Daily Progress Note Service Page: [phone]  Patient Assessment: Patient is a 78 yo male who presented with suprapubic pain and WBC and RBC on UA, found to have emphasymatous cystitis on CT.  Subjective: Patient doing well this morning, states pain improved with catheter placement.  Objective: Temp:  [97.6 F (36.4 C)-98.3 F (36.8 C)] 98.1 F (36.7 C) (08/27 0557) Pulse Rate:  [72-97] 97  (08/27 0557) Resp:  [18-20] 18  (08/27 0557) BP: (102-136)/(71-88) 105/71 mmHg (08/27 0557) SpO2:  [98 %-100 %] 99 % (08/27 0557) Exam: General: No acute distress, resting comfortably in bed Cardiovascular: rrr, no murmurs rubs or gallops Respiratory: CTAB Abdomen: no tenderness in suprapubic area Extremities: no edema  I have reviewed the patient's medications, labs, imaging, and diagnostic testing.  Notable results are summarized below.  CBC BMET   Lab 02/06/12 2155  WBC 9.2  HGB 12.4*  HCT 37.0*  PLT 204    Lab 02/06/12 2155  NA 138  K 4.4  CL 103  CO2 26  BUN 18  CREATININE 0.80  GLUCOSE 195*  CALCIUM 8.9     Results for orders placed during the hospital encounter of 02/06/12 (from the past 24 hour(s))  GLUCOSE, CAPILLARY     Status: Abnormal   Collection Time   02/07/12 12:37 PM      Component Value Range   Glucose-Capillary 174 (*) 70 - 99 mg/dL   Comment 1 Notify RN    GLUCOSE, CAPILLARY     Status: Abnormal   Collection Time   02/07/12  5:15 PM      Component Value Range   Glucose-Capillary 116 (*) 70 - 99 mg/dL   Comment 1 Notify RN    GLUCOSE, CAPILLARY     Status: Abnormal   Collection Time   02/07/12 10:05 PM      Component Value Range   Glucose-Capillary 181 (*) 70 - 99 mg/dL  GLUCOSE, CAPILLARY     Status: Abnormal   Collection Time   02/08/12  7:33 AM      Component Value Range   Glucose-Capillary 138 (*) 70 - 99 mg/dL    Imaging/Diagnostic  Tests: CT abdomen and pelvis: Circumferential bladder wall thickening with air non dependently  and along the periphery, which may be within the wall as can be  seen with emphysematous cystitis. Alternate consideration would be  a communication with bowel though none is identified. Recommend  urologic consultation.  Plan:   1. Emphysematous cystitis/UTI:  - Urologist recs continue antibiotics, CT cystogram to rule out vesico colonic fistula, continue antibiotics  - Continue CTX 1gm q24hr IV, s/p 1 dose in ED (it appears the most common organisms are E coli and Klebsiella)  - Morphine PRN pain  - foley placed after post-void residual of 300  - f/u urine culture - blood culture NGTD  2. HTN: BPs slightly hypertensive, but pt also uncomfortable  - Will continue home regimen for now (lisinopril/HCTZ 10/12.5)    3. DM: last A1c 7.2 (01/03/12)  - Hold home regimen (metformin 500 BID)  - SSI while in house  - Recently started on neurontin for neuropathy, will continue   4. HLD:  - Continue statin (simvastatin) while in house   5. GERD:  - Started on Prilosec recently, will continue protonix while in house   6. COPD: satting well on RA  - Continue home  spiriva, albuterol PRN   FEN/GI: heart healthy, carb mod diet; SLIV  Ppx: SQ heparin, protonix  Dispo: admit to inpatient, d/c pending clinical improvement   Marikay Alar, MD 02/08/2012, 8:27 AM

## 2012-02-08 NOTE — Progress Notes (Signed)
Patient ID: Nathaniel [name], male   DOB: 05-Jul-1933, 78 y.o.   MRN: [id number]  Interval progress note:  Went to see patient regarding his pain. Used phone interpreter for the conversation.  Patient complaining of pain in his penis with the foley.  Had not received any pain medications recently.  He is asking if the foley could be removed due to his pain.  Plan: agreed to remove the foley.  Will get post void residual and if elevated will re-insert the foley.  Given morphine for pain.  Will continue to follow patients urinary status.

## 2012-02-08 NOTE — Progress Notes (Addendum)
FMTS Attending Daily Note: Nathaniel Levy MD [phone] pager office [phone] I  have seen and examined this patient, reviewed their chart. I have discussed this patient with the resident. I agree with the resident's findings, assessment and care plan. Nathaniel [name] had CT cystogram today which showed no apparent fistula between bladder and bowel. Will await final urology recommendations. He is stable.

## 2012-02-09 LAB — BASIC METABOLIC PANEL
BUN: 20 mg/dL (ref 6–23)
CO2: 32 mEq/L (ref 19–32)
Calcium: 9.3 mg/dL (ref 8.4–10.5)
GFR calc non Af Amer: 80 mL/min — ABNORMAL LOW (ref >90)
Glucose, Bld: 146 mg/dL — ABNORMAL HIGH (ref 70–99)

## 2012-02-09 LAB — GLUCOSE, CAPILLARY
Glucose-Capillary: 128 mg/dL — ABNORMAL HIGH (ref 70–99)
Glucose-Capillary: 132 mg/dL — ABNORMAL HIGH (ref 70–99)

## 2012-02-09 LAB — CBC
MCV: 85.5 fL (ref 78.0–100.0)
WBC: 8.2 10*3/uL (ref 4.0–10.5)

## 2012-02-09 MED ORDER — DSS 100 MG PO CAPS: 100.0000 mg | ORAL_CAPSULE | Freq: Every day | ORAL | Status: AC

## 2012-02-09 MED ORDER — TAMSULOSIN HCL 0.4 MG PO CAPS
0.4000 mg | ORAL_CAPSULE | Freq: Every day | ORAL | Status: DC
  Administered 2012-02-09 – 2012-02-10 (×2): 0.4 mg via ORAL
  Filled 2012-02-09 (×3): qty 1

## 2012-02-09 MED ORDER — CEPHALEXIN 500 MG PO CAPS: 500.0000 mg | ORAL_CAPSULE | Freq: Four times a day (QID) | ORAL | Status: AC

## 2012-02-09 MED ORDER — POLYETHYLENE GLYCOL 3350 17 G PO PACK: 17.0000 g | PACK | Freq: Every day | ORAL | Status: AC

## 2012-02-09 MED ORDER — TAMSULOSIN HCL 0.4 MG PO CAPS: 0.4000 mg | ORAL_CAPSULE | Freq: Every day | ORAL | Status: DC

## 2012-02-09 NOTE — Progress Notes (Signed)
Patient ID: Nathaniel [name], male   DOB: 06/07/1934, 78 y.o.   MRN: [id number] Family Medicine Teaching Service Daily Progress Note Service Page: [phone]  Patient Assessment: Patient is a 78 yo male who presented with suprapubic pain and WBC and RBC on UA, found to have emphasymatous cystitis on CT.  Subjective: Patient doing well this morning, states pain improved from yesterday. Wants to know when he is going home.  Objective: Temp:  [97.5 F (36.4 C)-97.7 F (36.5 C)] 97.5 F (36.4 C) (08/28 0625) Pulse Rate:  [63-78] 69  (08/28 0625) Resp:  [16-17] 17  (08/28 0625) BP: (112-122)/(66-74) 112/66 mmHg (08/28 0625) SpO2:  [94 %-99 %] 98 % (08/28 0625) Exam: General: No acute distress, resting comfortably in bed Cardiovascular: rrr, no murmurs rubs or gallops Respiratory: CTAB Abdomen: no tenderness in suprapubic area Extremities: no edema  I have reviewed the patient's medications, labs, imaging, and diagnostic testing.  Notable results are summarized below.  CBC BMET   Lab 02/09/12 0500 02/08/12 1011 02/06/12 2155  WBC 8.2 8.6 9.2  HGB 12.1* 11.9* 12.4*  HCT 36.6* 36.0* 37.0*  PLT 207 206 204    Lab 02/09/12 0500 02/08/12 1011 02/06/12 2155  NA 137 138 138  K 3.9 4.0 4.4  CL 99 101 103  CO2 32 29 26  BUN 20 17 18   CREATININE 0.92 0.87 0.80  GLUCOSE 146* 154* 195*  CALCIUM 9.3 8.9 8.9     Results for orders placed during the hospital encounter of 02/06/12 (from the past 24 hour(s))  CBC     Status: Abnormal   Collection Time   02/08/12 10:11 AM      Component Value Range   WBC 8.6  4.0 - 10.5 K/uL   RBC 4.16 (*) 4.22 - 5.81 MIL/uL   Hemoglobin 11.9 (*) 13.0 - 17.0 g/dL   HCT 14.7 (*) 82.9 - 56.2 %   MCV 86.5  78.0 - 100.0 fL   MCH 28.6  26.0 - 34.0 pg   MCHC 33.1  30.0 - 36.0 g/dL   RDW 13.0  86.5 - 78.4 %   Platelets 206  150 - 400 K/uL  BASIC METABOLIC PANEL     Status: Abnormal   Collection Time   02/08/12 10:11 AM      Component Value Range   Sodium 138  135  - 145 mEq/L   Potassium 4.0  3.5 - 5.1 mEq/L   Chloride 101  96 - 112 mEq/L   CO2 29  19 - 32 mEq/L   Glucose, Bld 154 (*) 70 - 99 mg/dL   BUN 17  6 - 23 mg/dL   Creatinine, Ser 6.96  0.50 - 1.35 mg/dL   Calcium 8.9  8.4 - 29.5 mg/dL   GFR calc non Af Amer 82 (*) >90 mL/min   GFR calc Af Amer >90  >90 mL/min  GLUCOSE, CAPILLARY     Status: Abnormal   Collection Time   02/08/12  1:28 PM      Component Value Range   Glucose-Capillary 151 (*) 70 - 99 mg/dL  GLUCOSE, CAPILLARY     Status: Abnormal   Collection Time   02/08/12  5:10 PM      Component Value Range   Glucose-Capillary 147 (*) 70 - 99 mg/dL  GLUCOSE, CAPILLARY     Status: Abnormal   Collection Time   02/08/12 10:32 PM      Component Value Range   Glucose-Capillary 198 (*) 70 - 99  mg/dL  CBC     Status: Abnormal   Collection Time   02/09/12  5:00 AM      Component Value Range   WBC 8.2  4.0 - 10.5 K/uL   RBC 4.28  4.22 - 5.81 MIL/uL   Hemoglobin 12.1 (*) 13.0 - 17.0 g/dL   HCT 16.1 (*) 09.6 - 04.5 %   MCV 85.5  78.0 - 100.0 fL   MCH 28.3  26.0 - 34.0 pg   MCHC 33.1  30.0 - 36.0 g/dL   RDW 40.9  81.1 - 91.4 %   Platelets 207  150 - 400 K/uL  BASIC METABOLIC PANEL     Status: Abnormal   Collection Time   02/09/12  5:00 AM      Component Value Range   Sodium 137  135 - 145 mEq/L   Potassium 3.9  3.5 - 5.1 mEq/L   Chloride 99  96 - 112 mEq/L   CO2 32  19 - 32 mEq/L   Glucose, Bld 146 (*) 70 - 99 mg/dL   BUN 20  6 - 23 mg/dL   Creatinine, Ser 7.82  0.50 - 1.35 mg/dL   Calcium 9.3  8.4 - 95.6 mg/dL   GFR calc non Af Amer 80 (*) >90 mL/min   GFR calc Af Amer >90  >90 mL/min  GLUCOSE, CAPILLARY     Status: Abnormal   Collection Time   02/09/12  7:44 AM      Component Value Range   Glucose-Capillary 128 (*) 70 - 99 mg/dL   Comment 1 Notify RN      Imaging/Diagnostic Tests: CT abdomen and pelvis: Circumferential bladder wall thickening with air non dependently  and along the periphery, which may be within the  wall as can be  seen with emphysematous cystitis. Alternate consideration would be  a communication with bowel though none is identified. Recommend  urologic consultation.  CT cystogram: Bladder wall pneumatosis and thickening compatible with the given  history of emphysematous cystitis. No extravasation of contrast or  abnormal communication to suggest fistula. Occasionally, fistulous  connection with adjacent hollow viscus may be more readily  detectable with barium enema or small bowel follow-through.   Plan:   1. Emphysematous cystitis/UTI:  - Urologist recs Start Flomax 0.4 mgm daily.  Switch to oral antibiotics. -CT cystogram revealed no communication between bowel and bladder -PVR following removal of catheter 230. Will likely need to go home with foley catheter. - Currently on CTX 1gm q24hr IV-will transition to PO antibiotic today, likely keflex 500 mg QID - Morphine PRN pain  - f/u urine culture - blood culture NGTD  2. HTN: BPs normotensive, but pt also uncomfortable  - Will continue home regimen for now (lisinopril/HCTZ 10/12.5)    3. DM: last A1c 7.2 (01/03/12)  - Hold home regimen (metformin 500 BID)  - SSI while in house  - Recently started on neurontin for neuropathy, will continue   4. HLD:  - Continue statin (simvastatin) while in house   5. GERD:  - Started on Prilosec recently, will continue protonix while in house   6. COPD: satting well on RA  - Continue home spiriva, albuterol PRN   FEN/GI: heart healthy, carb mod diet; SLIV  Ppx: SQ heparin, protonix  Dispo: admit to inpatient, likely d/c today   Marikay Alar, MD 02/09/2012, 9:01 AM

## 2012-02-09 NOTE — Progress Notes (Signed)
Received call by nurse stating that pain was refusing to go home with foley catheter in place.  I went and saw the patient and adamantly expressed the necessity of the foley catheter (via the language line).  Patient complained of severe pain associated with the catheter as well as some embarrassment about going home with it.  Patient continued to refuse to be discharged with the catheter.  I spoke with the Urologist on call, Dr. Laverle Patter, about the current situation.  He recommended discontinuing the foley catheter overnight with post void residuals following each void, and reassessment in the a.m.  If post voids are 100's-200's and improving/stable throughout the night, patient may be able to be discharge without the foley.

## 2012-02-09 NOTE — Progress Notes (Signed)
RN called [employer] to discuss discharge instructions with patient.  We discussed re-inserting the Foley catheter and that the pt would have to go home with it.  We discussed caring for the catheter and how to empty it.  We also discussed importance of pt picking up his antibiotics from the pharmacy and taking them until they are gone.  Pt also verbalized understanding of making f/u appts with primary MD and Urologist.  Pt verbalized understanding of all instructions per the interpreter.  RN still cannot reach pt's daughter to speak with her and to see about picking up pt for discharge.  Will cont to monitor.

## 2012-02-09 NOTE — Progress Notes (Signed)
FMTS Attending Daily Note: Yusef Lamp MD [phone] pager office [phone] I  have seen and examined this patient, reviewed their chart. I have discussed this patient with the resident. I agree with the resident's findings, assessment and care plan. 

## 2012-02-09 NOTE — Progress Notes (Signed)
Afebrile.  V/S stable Foley out.  Voids on his own. Cystogram shows no extravasation of contrast.   Urine culture: pending. Suggest:  Start Flomax 0.4 mgm daily.                  Check PVR.                 Switch to oral antibiotics.                 Can be discharged urologically.                 Will follow as outpatient.

## 2012-02-09 NOTE — Progress Notes (Signed)
After NT re-inserted the Foley catheter, pt's got very upset stating "NO, NO, No go home with this (pointing to Foley).  Go home with this (pointing to urinal)!!!" Called Dr Adriana Simas to assist with this situation.  Dr Adriana Simas came to pt's room and we called [employer] again to explain the importance of wearing the Foley home.  Dr Adriana Simas is working on a solution with pt and Urology.

## 2012-02-10 LAB — BASIC METABOLIC PANEL
CO2: 30 mEq/L (ref 19–32)
Calcium: 9.5 mg/dL (ref 8.4–10.5)
Chloride: 98 mEq/L (ref 96–112)
Creatinine, Ser: 0.93 mg/dL (ref 0.50–1.35)
GFR calc Af Amer: >90 mL/min (ref >90)
Sodium: 137 mEq/L (ref 135–145)

## 2012-02-10 LAB — GLUCOSE, CAPILLARY
Glucose-Capillary: 116 mg/dL — ABNORMAL HIGH (ref 70–99)
Glucose-Capillary: 163 mg/dL — ABNORMAL HIGH (ref 70–99)

## 2012-02-10 LAB — URINE CULTURE

## 2012-02-10 NOTE — Progress Notes (Signed)
Spoke with patient via interpreter phone .Explained to patient about foley catheter removal and need for patient to call for nurse each time he voids tonight for bladder scans.Patient verbalized understanding. me

## 2012-02-10 NOTE — Progress Notes (Signed)
Patient ID: Nathaniel [name], male   DOB: 1933/08/22, 78 y.o.   MRN: [id number] Family Medicine Teaching Service Daily Progress Note Service Page: [phone]  Patient Assessment: Patient is a 78 yo male who presented with suprapubic pain and WBC and RBC on UA, found to have emphasymatous cystitis on CT.  Subjective: Patient feeling well this am. Currently has no foley catheter. Continues to retain urine - 199-315.  Objective: Temp:  [97.3 F (36.3 C)-97.6 F (36.4 C)] 97.3 F (36.3 C) (08/29 0611) Pulse Rate:  [69-83] 76  (08/29 0611) Resp:  [16-18] 16  (08/29 0611) BP: (98-107)/(61-68) 98/61 mmHg (08/29 0611) SpO2:  [97 %-100 %] 97 % (08/29 0739)  Exam: General: awake, alert. NAD. Cardiovascular: RRR. No murmurs, rubs, or gallops. Respiratory: CTAB. No rales, rhonchi, or wheeze. Abdomen: soft, nontender, nondistended.  Extremities: No edema noted.  CBC BMET   Lab 02/09/12 0500 02/08/12 1011 02/06/12 2155  WBC 8.2 8.6 9.2  HGB 12.1* 11.9* 12.4*  HCT 36.6* 36.0* 37.0*  PLT 207 206 204    Lab 02/10/12 0625 02/09/12 0500 02/08/12 1011  NA 137 137 138  K 4.1 3.9 4.0  CL 98 99 101  CO2 30 32 29  BUN 23 20 17   CREATININE 0.93 0.92 0.87  GLUCOSE 119* 146* 154*  CALCIUM 9.5 9.3 8.9     Imaging/Diagnostic Tests: CT abdomen and pelvis: Circumferential bladder wall thickening with air non dependently  and along the periphery, which may be within the wall as can be  seen with emphysematous cystitis. Alternate consideration would be  a communication with bowel though none is identified. Recommend  urologic consultation.  CT cystogram: Bladder wall pneumatosis and thickening compatible with the given  history of emphysematous cystitis. No extravasation of contrast or  abnormal communication to suggest fistula. Occasionally, fistulous  connection with adjacent hollow viscus may be more readily  detectable with barium enema or small bowel follow-through.  Urine Cx - >100,000 CFU Pan  Sensitive E. coli  Plan:   1. Emphysematous cystitis/UTI:  - Urology recommendations - Start Flomax 0.4 mgm daily, - CT cystogram revealed no communication between bowel and bladder - Currently on CTX 1gm q24hr IV. Wiill transition to PO antibiotic at discharge today - Keflex 500 mg QID - Urine Cx - Pan sensitive E. coli  2. HTN - Will continue home lisinopril 10 mg and HCTZ 12.5  3. DM - D/C today on home regimen (metformin & gabapentin)  4. HLD:  - Continue statin.  Will resume home statin at discharge.  5. GERD:  - Started on Prilosec recently, will continue protonix in house  6. COPD: - Continue home spiriva, albuterol PRN   FEN/GI: heart healthy, carb mod diet Ppx: SQ heparin, protonix  Dispo: D/C today. Spoke with Urologist Dr. Brunilda Payor, who stated that he could be discharged without foley catheter.     Everlene Other, DO 02/10/2012, 7:54 AM

## 2012-02-10 NOTE — Discharge Summary (Signed)
Physician Discharge Summary  Patient ID: Weber Monnier MRN: [id number] DOB: [birth year] Age: 78 y.o.  Admit date: 02/06/2012 Discharge date: 02/10/2012 Admitting Physician: Sanjuana Letters, MD  PCP: No primary provider on file.  Consultants: Urology, Dr. Brunilda Payor     Discharge Diagnosis: Emphysematous cystitis Principal Problem:  *Emphysematous cystitis Active Problems:  DIABETES MELLITUS  Essential hypertension, benign  Hyperlipidemia  Diabetes mellitus    Hospital Course 1. Emphysematous cystitis: presented to the ED with several hour history of suprapubic pain.  CT scan revealed emphysematous cystitis. Patient was started on IV rocephin. Urology was consulted and recommended continuing IV antibiotic.  Postvoid residual was done the morning after admission and returned 300 mL, so a foley was placed.  CT cysto gram was performed and revealed no communication between bowel and bladder.  Foley remained in place until day of CT cystogram, when patient complained of pain in his penis.  Foley removed and post void residual revealed retention and foley was replaced.  Started flomax per urology recommendations.  On initial day of discharge patient refused to leave with the foley in place and Dr. Adriana Simas spoke to urology regarding this issue.  They stated doing postvoid residuals to determine patient baseline retention with values between 258-315.  Patient was discharged without a foley and on Keflex 500 mg QID.  Urine culture was pan-sensitive E. Coli.  2. HTN: patients blood pressure was in the normal to low end of normal throughout his hospitalization.  He was continued on home lisinopril 10 mg and HCTZ 12.5 mg.   3. DM: CBGs 116-198 while in house.  Was on SSI in hospital. Restarted home medications (metformin and gabapentin) on discharge.   4. HLD: continued on home statin while in hospital.   5. GERD: on protonix while in hospital. Restarted on home prilosec at discharge.  6. COPD: O2 sats  stable throughout hospitalization. Continued on home spiriva, with albuterol prn.   Problem List 1. Emphysematous cystitis 2. HTN 3. DM 4. HLD 5. GERD 6. COPD         Discharge PE   Filed Vitals:   02/10/12 1406  BP: 100/66  Pulse: 80  Temp: 98.4 F (36.9 C)  Resp: 16   General: awake, alert. NAD.  Cardiovascular: RRR. No murmurs, rubs, or gallops.  Respiratory: CTAB. No rales, rhonchi, or wheeze.  Abdomen: soft, nontender, nondistended.  Extremities: No edema noted.   Procedures/Imaging:  Ct Abdomen Pelvis Wo Contrast  02/06/2012 IMPRESSION: Circumferential bladder wall thickening with air non dependently and along the periphery, which may be within the wall as can be seen with emphysematous cystitis.  Alternate consideration would be a communication with bowel though none is identified.  Recommend urologic consultation.  Discussed via telephone with Dr. Christain Sacramento at 10:35 p.m. on 02/06/2012.     Ct Pelvis W Contrast  02/08/2012  IMPRESSION: Bladder wall pneumatosis and thickening compatible with the given history of emphysematous cystitis.  No extravasation of contrast or abnormal communication to suggest fistula.  Occasionally, fistulous connection with adjacent hollow viscus may be more readily detectable with barium enema or small bowel follow-through.     Labs  CBC  Lab 02/09/12 0500 02/08/12 1011 02/06/12 2155  WBC 8.2 8.6 9.2  HGB 12.1* 11.9* 12.4*  HCT 36.6* 36.0* 37.0*  PLT 207 206 204   BMET  Lab 02/10/12 0625 02/09/12 0500 02/08/12 1011  NA 137 137 138  K 4.1 3.9 4.0  CL 98 99 101  CO2 30 32 29  BUN 23 20 17   CREATININE 0.93 0.92 0.87  CALCIUM 9.5 9.3 8.9  PROT -- -- --  BILITOT -- -- --  ALKPHOS -- -- --  ALT -- -- --  AST -- -- --  GLUCOSE 119* 146* 154*   Urine Culture: pan-sensative E. Coli Blood culture: NGTD   Results for orders placed during the hospital encounter of 02/06/12 (from the past 72 hour(s))  GLUCOSE, CAPILLARY     Status:  Abnormal   Collection Time   02/07/12  5:15 PM      Component Value Range Comment   Glucose-Capillary 116 (*) 70 - 99 mg/dL    Comment 1 Notify RN     GLUCOSE, CAPILLARY     Status: Abnormal   Collection Time   02/07/12 10:05 PM      Component Value Range Comment   Glucose-Capillary 181 (*) 70 - 99 mg/dL   GLUCOSE, CAPILLARY     Status: Abnormal   Collection Time   02/08/12  7:33 AM      Component Value Range Comment   Glucose-Capillary 138 (*) 70 - 99 mg/dL   CBC     Status: Abnormal   Collection Time   02/08/12 10:11 AM      Component Value Range Comment   WBC 8.6  4.0 - 10.5 K/uL    RBC 4.16 (*) 4.22 - 5.81 MIL/uL    Hemoglobin 11.9 (*) 13.0 - 17.0 g/dL    HCT 16.1 (*) 09.6 - 52.0 %    MCV 86.5  78.0 - 100.0 fL    MCH 28.6  26.0 - 34.0 pg    MCHC 33.1  30.0 - 36.0 g/dL    RDW 04.5  40.9 - 81.1 %    Platelets 206  150 - 400 K/uL   BASIC METABOLIC PANEL     Status: Abnormal   Collection Time   02/08/12 10:11 AM      Component Value Range Comment   Sodium 138  135 - 145 mEq/L    Potassium 4.0  3.5 - 5.1 mEq/L    Chloride 101  96 - 112 mEq/L    CO2 29  19 - 32 mEq/L    Glucose, Bld 154 (*) 70 - 99 mg/dL    BUN 17  6 - 23 mg/dL    Creatinine, Ser 9.14  0.50 - 1.35 mg/dL    Calcium 8.9  8.4 - 78.2 mg/dL    GFR calc non Af Amer 82 (*) >90 mL/min    GFR calc Af Amer >90  >90 mL/min   GLUCOSE, CAPILLARY     Status: Abnormal   Collection Time   02/08/12  1:28 PM      Component Value Range Comment   Glucose-Capillary 151 (*) 70 - 99 mg/dL   GLUCOSE, CAPILLARY     Status: Abnormal   Collection Time   02/08/12  5:10 PM      Component Value Range Comment   Glucose-Capillary 147 (*) 70 - 99 mg/dL   GLUCOSE, CAPILLARY     Status: Abnormal   Collection Time   02/08/12 10:32 PM      Component Value Range Comment   Glucose-Capillary 198 (*) 70 - 99 mg/dL   CBC     Status: Abnormal   Collection Time   02/09/12  5:00 AM      Component Value Range Comment   WBC 8.2  4.0 - 10.5 K/uL     RBC 4.28  4.22 -  5.81 MIL/uL    Hemoglobin 12.1 (*) 13.0 - 17.0 g/dL    HCT 04.5 (*) 40.9 - 52.0 %    MCV 85.5  78.0 - 100.0 fL    MCH 28.3  26.0 - 34.0 pg    MCHC 33.1  30.0 - 36.0 g/dL    RDW 81.1  91.4 - 78.2 %    Platelets 207  150 - 400 K/uL   BASIC METABOLIC PANEL     Status: Abnormal   Collection Time   02/09/12  5:00 AM      Component Value Range Comment   Sodium 137  135 - 145 mEq/L    Potassium 3.9  3.5 - 5.1 mEq/L    Chloride 99  96 - 112 mEq/L    CO2 32  19 - 32 mEq/L    Glucose, Bld 146 (*) 70 - 99 mg/dL    BUN 20  6 - 23 mg/dL    Creatinine, Ser 9.56  0.50 - 1.35 mg/dL    Calcium 9.3  8.4 - 21.3 mg/dL    GFR calc non Af Amer 80 (*) >90 mL/min    GFR calc Af Amer >90  >90 mL/min   GLUCOSE, CAPILLARY     Status: Abnormal   Collection Time   02/09/12  7:44 AM      Component Value Range Comment   Glucose-Capillary 128 (*) 70 - 99 mg/dL    Comment 1 Notify RN     GLUCOSE, CAPILLARY     Status: Abnormal   Collection Time   02/09/12 11:51 AM      Component Value Range Comment   Glucose-Capillary 132 (*) 70 - 99 mg/dL    Comment 1 Notify RN     GLUCOSE, CAPILLARY     Status: Abnormal   Collection Time   02/09/12  6:37 PM      Component Value Range Comment   Glucose-Capillary 160 (*) 70 - 99 mg/dL   GLUCOSE, CAPILLARY     Status: Abnormal   Collection Time   02/09/12 10:01 PM      Component Value Range Comment   Glucose-Capillary 117 (*) 70 - 99 mg/dL   BASIC METABOLIC PANEL     Status: Abnormal   Collection Time   02/10/12  6:25 AM      Component Value Range Comment   Sodium 137  135 - 145 mEq/L    Potassium 4.1  3.5 - 5.1 mEq/L    Chloride 98  96 - 112 mEq/L    CO2 30  19 - 32 mEq/L    Glucose, Bld 119 (*) 70 - 99 mg/dL    BUN 23  6 - 23 mg/dL    Creatinine, Ser 0.86  0.50 - 1.35 mg/dL    Calcium 9.5  8.4 - 57.8 mg/dL    GFR calc non Af Amer 80 (*) >90 mL/min    GFR calc Af Amer >90  >90 mL/min   GLUCOSE, CAPILLARY     Status: Abnormal   Collection Time     02/10/12  7:55 AM      Component Value Range Comment   Glucose-Capillary 116 (*) 70 - 99 mg/dL   GLUCOSE, CAPILLARY     Status: Abnormal   Collection Time   02/10/12 12:24 PM      Component Value Range Comment   Glucose-Capillary 163 (*) 70 - 99 mg/dL        Patient condition at time of discharge/disposition: stable  Disposition-home   Follow up issues: 1. Urine out put given retention in hospital and sent home without a foley in place 2. Blood cultures have shown NGTD 3. Make sure patient finished antibiotics  Discharge follow up:  Follow-up Information    Schedule an appointment as soon as possible for a visit with [facility]. (With Dr. Nedra Hai within 1 week)    Contact information:   [address] [place] [address]       Follow up with Lindaann Slough, MD on 03/21/2012. (1045 am )    Contact information:   [address] [place], [address] [facility] [place] [address] [phone]          Discharge Instructions: Please refer to Patient Instructions section of EMR for full details.  Patient was counseled important signs and symptoms that should prompt return to medical care, changes in medications, dietary instructions, activity restrictions, and follow up appointments.  Significant instructions noted below:  Discharge Orders    Future Orders Please Complete By Expires   Diet - low sodium heart healthy      Diet - low sodium heart healthy      Increase activity slowly      Increase activity slowly          Discharge Medications Medication List  As of 02/12/2012  9:29 AM   START taking these medications         cephALEXin 500 MG capsule   Commonly known as: KEFLEX   Take 1 capsule (500 mg total) by mouth 4 (four) times daily.      DSS 100 MG Caps   Take 100 mg by mouth daily.      polyethylene glycol packet   Commonly known as: MIRALAX / GLYCOLAX   Take 17 g  by mouth daily.      Tamsulosin HCl 0.4 MG Caps   Commonly known as: FLOMAX   Take 1 capsule (0.4 mg total) by mouth daily.         CONTINUE taking these medications         Fluticasone-Salmeterol 500-50 MCG/DOSE Aepb   Commonly known as: ADVAIR   Inhale 1 puff into the lungs 2 (two) times daily.      gabapentin 100 MG capsule   Commonly known as: NEURONTIN   Take 1 capsule (100 mg total) by mouth 3 (three) times daily.      lisinopril-hydrochlorothiazide 10-12.5 MG per tablet   Commonly known as: PRINZIDE,ZESTORETIC      metFORMIN 500 MG tablet   Commonly known as: GLUCOPHAGE   Take 1 tablet (500 mg total) by mouth 2 (two) times daily with a meal. 1 tablet by mouth twice daily for diabetes      montelukast 10 MG tablet   Commonly known as: SINGULAIR   take 1 tablet by mouth once daily      omeprazole 20 MG capsule   Commonly known as: PRILOSEC   Take 1 capsule (20 mg total) by mouth daily.      pravastatin 40 MG tablet   Commonly known as: PRAVACHOL   Take 1 tablet (40 mg total) by mouth daily.      tiotropium 18 MCG inhalation capsule   Commonly known as: SPIRIVA   Place 1 capsule (18 mcg total) into inhaler and inhale daily. 1 cap inhaled daily      VENTOLIN HFA 108 (90 BASE) MCG/ACT inhaler   Generic drug:  albuterol          Where to get your medications    These are the prescriptions that you need to pick up. We sent them to a specific pharmacy, so you will need to go there to get them.   [facility] - [place] - [address]    [address] [place]  [address]    Phone: [phone]        cephALEXin 500 MG capsule   DSS 100 MG Caps   polyethylene glycol packet   Tamsulosin HCl 0.4 MG Caps           Marikay Alar, MD of [facility] 02/12/2012 9:50 AM

## 2012-02-10 NOTE — Progress Notes (Signed)
FMTS Attending Daily Note: Rifka Ramey MD [phone] pager office [phone] I  have seen and examined this patient, reviewed their chart. I have discussed this patient with the resident. I agree with the resident's findings, assessment and care plan. 

## 2012-02-12 NOTE — Discharge Summary (Signed)
Family Medicine Teaching Service  Discharge Note : Attending Dailynn Nancarrow MD Pager [phone] Office [phone] I have seen and examined this patient, reviewed their chart and discussed discharge planning wit the resident at the time of discharge. I agree with the discharge plan as above.  

## 2012-02-13 LAB — CULTURE, BLOOD (ROUTINE X 2): Culture: NO GROWTH

## 2012-02-23 ENCOUNTER — Ambulatory Visit: Payer: Medicare Other | Admitting: Family Medicine

## 2012-02-23 VITALS — BP 124/80 | HR 64 | Temp 97.4°F | Resp 16 | Ht 63.0 in | Wt 116.8 lb

## 2012-02-23 DIAGNOSIS — N390 Urinary tract infection, site not specified: Secondary | ICD-10-CM

## 2012-02-23 DIAGNOSIS — E119 Type 2 diabetes mellitus without complications: Secondary | ICD-10-CM

## 2012-02-23 LAB — POCT CBC
Granulocyte percent: 72.7 %G (ref 37–80)
HCT, POC: 42.5 % — AB (ref 43.5–53.7)
MCH, POC: 27.2 pg (ref 27–31.2)
MCV: 90.9 fL (ref 80–97)
POC LYMPH PERCENT: 21.6 %L (ref 10–50)
RBC: 4.67 M/uL — AB (ref 4.69–6.13)
RDW, POC: 14 %
WBC: 6.7 10*3/uL (ref 4.6–10.2)

## 2012-02-23 NOTE — Progress Notes (Signed)
[facility] [address] [place] [address] [phone]  Date:  02/23/2012   Name:  Nathaniel [name]   DOB:  12/02/1933   MRN:  [id number]  PCP:  No primary provider on file.    Chief Complaint: Follow-up   History of Present Illness:  Nathaniel [name] is a 78 y.o. very pleasant male patient who presents with the following:  Recently admitted to [facility] with emphysematous cystitis. HE was treated with IV rocephin while inpatient and then released with keflex.  He notes no dysuria.  He does feel a little bit bloated.  He is having normal bowel movements.    He is taking keflex 500 QID.  He was released from the hospital on 02/10/12.  He is supposed to follow- up with Dr. Brunilda Payor next month according to his discharge paperwork.    Nathaniel [name] also wants to go over how to use his new glucose meter today.  He was given a new meter but is not sure how it works.   Patient Active Problem List  Diagnosis  . HYPERTHYROIDISM, SUBCLINICAL  . DIABETES MELLITUS  . Essential hypertension, benign  . COPD  . PULMONARY NODULE  . INSOMNIA  . HTN (hypertension)  . Hyperlipidemia  . Diabetes mellitus  . Neuropathy  . GERD (gastroesophageal reflux disease)  . Emphysematous cystitis    Past Medical History  Diagnosis Date  . Diabetes mellitus   . Hyperlipidemia   . Hypertension     No past surgical history on file.  History  Substance Use Topics  . Smoking status: Never Smoker   . Smokeless tobacco: Not on file  . Alcohol Use: No    No family history on file.  No Known Allergies  Medication list has been reviewed and updated.  Current Outpatient Prescriptions on File Prior to Visit  Medication Sig Dispense Refill  . albuterol (VENTOLIN HFA) 108 (90 BASE) MCG/ACT inhaler Inhale 1-2 puffs every 4 hours as needed for breathing difficulties       . Fluticasone-Salmeterol (ADVAIR) 500-50 MCG/DOSE AEPB Inhale 1 puff into the lungs 2 (two) times daily.  60 each  11  . gabapentin  (NEURONTIN) 100 MG capsule Take 1 capsule (100 mg total) by mouth 3 (three) times daily.  90 capsule  3  . lisinopril-hydrochlorothiazide (PRINZIDE,ZESTORETIC) 10-12.5 MG per tablet Take 1 tablet by mouth daily.      . metFORMIN (GLUCOPHAGE) 500 MG tablet Take 1 tablet (500 mg total) by mouth 2 (two) times daily with a meal. 1 tablet by mouth twice daily for diabetes  60 tablet  5  . montelukast (SINGULAIR) 10 MG tablet take 1 tablet by mouth once daily  30 tablet  11  . omeprazole (PRILOSEC) 20 MG capsule Take 1 capsule (20 mg total) by mouth daily.  30 capsule  6  . pravastatin (PRAVACHOL) 40 MG tablet Take 1 tablet (40 mg total) by mouth daily.  30 tablet  5  . Tamsulosin HCl (FLOMAX) 0.4 MG CAPS Take 1 capsule (0.4 mg total) by mouth daily.  30 capsule  2  . tiotropium (SPIRIVA) 18 MCG inhalation capsule Place 1 capsule (18 mcg total) into inhaler and inhale daily. 1 cap inhaled daily  30 capsule  11    Review of Systems:  As per HPI- otherwise negative. Interviewed today with the help of his daughter who interpreted   Physical Examination: Filed Vitals:   02/23/12 1115  BP: 124/80  Pulse: 64  Temp: 97.4  F (36.3 C)  Resp: 16   Filed Vitals:   02/23/12 1115  Height: 5\' 3"  (1.6 m)  Weight: 116 lb 12.8 oz (52.98 kg)   Body mass index is 20.69 kg/(m^2). Ideal Body Weight: Weight in (lb) to have BMI = 25: 140.8   GEN: WDWN, NAD, Non-toxic, A & O x 3, slim build.   HEENT: Atraumatic, Normocephalic. Neck supple. No masses, No LAD.  TM and oropharynx wnl Ears and Nose: No external deformity. CV: RRR, No M/G/R. No JVD. No thrill. No extra heart sounds. PULM: CTA B, no wheezes, crackles, rhonchi. No retractions. No resp. distress. No accessory muscle use. ABD: S, NT, ND, +BS. No rebound. No HSM.  No tenderness or abnormality over the bladder.  EXTR: No c/c/e NEURO Normal gait.  PSYCH: Normally interactive. Conversant. Not depressed or anxious appearing.  Calm demeanor.   Results  for orders placed in visit on 02/23/12  POCT CBC      Component Value Range   WBC 6.7  4.6 - 10.2 K/uL   Lymph, poc 1.4  0.6 - 3.4   POC LYMPH PERCENT 21.6  10 - 50 %L   MID (cbc) 0.4  0 - 0.9   POC MID % 5.7  0 - 12 %M   POC Granulocyte 4.9  2 - 6.9   Granulocyte percent 72.7  37 - 80 %G   RBC 4.67 (*) 4.69 - 6.13 M/uL   Hemoglobin 12.7 (*) 14.1 - 18.1 g/dL   HCT, POC 40.9 (*) 81.1 - 53.7 %   MCV 90.9  80 - 97 fL   MCH, POC 27.2  27 - 31.2 pg   MCHC 29.9 (*) 31.8 - 35.4 g/dL   RDW, POC 91.4     Platelet Count, POC 265  142 - 424 K/uL   MPV 7.8  0 - 99.8 fL  POCT GLYCOSYLATED HEMOGLOBIN (HGB A1C)      Component Value Range   Hemoglobin A1C 7.3      Assessment and Plan: 1. Urinary tract infection  POCT CBC  2. Diabetes mellitus type II  POCT glycosylated hemoglobin (Hb A1C)   Demonstrated how to use his new glucose meter.   Follow- up labs as above.  Reminded them to be sure and follow- up with Dr. Brunilda Payor as planned.  Let me know if any problems in the meantime.   Abbe Amsterdam, MD

## 2012-06-14 DIAGNOSIS — Z0271 Encounter for disability determination: Secondary | ICD-10-CM

## 2012-09-16 ENCOUNTER — Ambulatory Visit (INDEPENDENT_AMBULATORY_CARE_PROVIDER_SITE_OTHER): Payer: Medicare Other | Admitting: Family Medicine

## 2012-09-16 ENCOUNTER — Ambulatory Visit: Payer: Medicare Other

## 2012-09-16 VITALS — BP 158/92 | HR 89 | Temp 97.6°F | Resp 28

## 2012-09-16 DIAGNOSIS — K5900 Constipation, unspecified: Secondary | ICD-10-CM

## 2012-09-16 DIAGNOSIS — R079 Chest pain, unspecified: Secondary | ICD-10-CM

## 2012-09-16 DIAGNOSIS — J441 Chronic obstructive pulmonary disease with (acute) exacerbation: Secondary | ICD-10-CM

## 2012-09-16 DIAGNOSIS — R0602 Shortness of breath: Secondary | ICD-10-CM

## 2012-09-16 DIAGNOSIS — IMO0001 Reserved for inherently not codable concepts without codable children: Secondary | ICD-10-CM

## 2012-09-16 DIAGNOSIS — R109 Unspecified abdominal pain: Secondary | ICD-10-CM

## 2012-09-16 DIAGNOSIS — G479 Sleep disorder, unspecified: Secondary | ICD-10-CM

## 2012-09-16 DIAGNOSIS — N390 Urinary tract infection, site not specified: Secondary | ICD-10-CM

## 2012-09-16 LAB — COMPREHENSIVE METABOLIC PANEL
ALT: 12 U/L (ref 0–53)
AST: 14 U/L (ref 0–37)
CO2: 27 mEq/L (ref 19–32)
Creat: 0.93 mg/dL (ref 0.50–1.35)
Total Bilirubin: 0.4 mg/dL (ref 0.3–1.2)

## 2012-09-16 LAB — POCT CBC
Lymph, poc: 1.1 (ref 0.6–3.4)
MCH, POC: 27.9 pg (ref 27–31.2)
MCHC: 31.1 g/dL — AB (ref 31.8–35.4)
MID (cbc): 0.4 (ref 0–0.9)
MPV: 9.1 fL (ref 0–99.8)
POC Granulocyte: 6.7 (ref 2–6.9)
POC MID %: 4.5 %M (ref 0–12)
Platelet Count, POC: 249 10*3/uL (ref 142–424)
WBC: 8.2 10*3/uL (ref 4.6–10.2)

## 2012-09-16 LAB — POCT URINALYSIS DIPSTICK
Glucose, UA: NEGATIVE
Nitrite, UA: POSITIVE
Urobilinogen, UA: 0.2

## 2012-09-16 LAB — POCT GLYCOSYLATED HEMOGLOBIN (HGB A1C): Hemoglobin A1C: 7.2

## 2012-09-16 LAB — POCT UA - MICROSCOPIC ONLY
Casts, Ur, LPF, POC: NEGATIVE
Yeast, UA: POSITIVE

## 2012-09-16 LAB — GLUCOSE, POCT (MANUAL RESULT ENTRY): POC Glucose: 194 mg/dl — AB (ref 70–99)

## 2012-09-16 MED ORDER — TRAZODONE HCL 50 MG PO TABS: 25.0000 mg | ORAL_TABLET | Freq: Every evening | ORAL | Status: DC | PRN

## 2012-09-16 MED ORDER — METFORMIN HCL 500 MG PO TABS: 500.0000 mg | ORAL_TABLET | Freq: Two times a day (BID) | ORAL | Status: DC

## 2012-09-16 MED ORDER — SULFAMETHOXAZOLE-TRIMETHOPRIM 800-160 MG PO TABS: 1.0000 | ORAL_TABLET | Freq: Two times a day (BID) | ORAL | Status: DC

## 2012-09-16 NOTE — Patient Instructions (Addendum)
   You have many problems, and we will try and treat some of them. You need to return in about 10-14 days to get rechecked.    Stop the doxazosin, but continue the tamsulosin. This is to help you urinate better.    Take the antibiotic pill twice daily at breakfast and supper for the bladder infection (sulfamethoxazole)    Get a battery for your meter so you can check your sugars. Resumed taking the metformin twice daily for the diabetes    Continue taking the gabapentin for the pain in the feet and legs that is being caused by your poorly controlled diabetes.    Continue using your breathing medications    Take MiraLax (can be purchased without a prescription over the counter) one dose daily until bowels are moving better, then just use it when necessary. It is a powder that needs to be started in a glass of water or juice. Follow the directions on the packet.    Take the trazodone 25 mg (one half of a 50 mg pill) at bedtime for sleep

## 2012-09-16 NOTE — Progress Notes (Signed)
Subjective: 79 year old man is here for several things. He has been short of breath the last couple of days. He has abdominal pain and bloating. His balance has not been moving for several days. He hurts some in his low abdomen. He has not been here for a while with regard to his diabetes. He is out of some of his medications. He does not speak [place], but his daughter helped interpret. He doesn't have headaches. He initially had said he had chest pains, but it appeared that that is not a major complaint acutely. His main problems are with regard to his abdomen. He lives alone, feeds himself, eats primarily a [employer].  Has not been taking his metformin. Has not been checking his sugars. Is having problems with not being able to sleep. Benadryl doesn't help very well.  Objective: Asian male who is breathing a little hard. He indicates that his main discomfort is in his abdomen. His throat was clear. Neck supple without significant nodes. Chest is clear to auscultation. Heart regular without murmurs. Abdomen has normal bowel sounds without any masses. He is a little tender in the lower abdomen. Extremities unremarkable. Skin warm and dry.  Results for orders placed in visit on 09/16/12  POCT CBC      Result Value Range   WBC 8.2  4.6 - 10.2 K/uL   Lymph, poc 1.1  0.6 - 3.4   POC LYMPH PERCENT 13.9  10 - 50 %L   MID (cbc) 0.4  0 - 0.9   POC MID % 4.5  0 - 12 %M   POC Granulocyte 6.7  2 - 6.9   Granulocyte percent 81.6 (*) 37 - 80 %G   RBC 4.66 (*) 4.69 - 6.13 M/uL   Hemoglobin 13.0 (*) 14.1 - 18.1 g/dL   HCT, POC 78.2 (*) 95.6 - 53.7 %   MCV 89.7  80 - 97 fL   MCH, POC 27.9  27 - 31.2 pg   MCHC 31.1 (*) 31.8 - 35.4 g/dL   RDW, POC 21.3     Platelet Count, POC 249  142 - 424 K/uL   MPV 9.1  0 - 99.8 fL  POCT UA - MICROSCOPIC ONLY      Result Value Range   WBC, Ur, HPF, POC tntc     RBC, urine, microscopic tntc     Bacteria, U Microscopic 2+     Mucus, UA mod     Epithelial cells,  urine per micros neg     Crystals, Ur, HPF, POC neg     Casts, Ur, LPF, POC neg     Yeast, UA pos    POCT URINALYSIS DIPSTICK      Result Value Range   Color, UA yellow     Clarity, UA cloudy     Glucose, UA neg     Bilirubin, UA neg     Ketones, UA neg     Spec Grav, UA 1.015     Blood, UA mod     pH, UA 5.5     Protein, UA 100     Urobilinogen, UA 0.2     Nitrite, UA pos     Leukocytes, UA moderate (2+)    GLUCOSE, POCT (MANUAL RESULT ENTRY)      Result Value Range   POC Glucose 194 (*) 70 - 99 mg/dl  POCT GLYCOSYLATED HEMOGLOBIN (HGB A1C)      Result Value Range   Hemoglobin A1C 7.2  [facility] reading (PRIMARY) by  Dr. Alwyn Ren Normal chest. Lots of gas in large and small bowels.  No obvious obstruction pattern. No free air..  Assessment: Abdominal pain uti Dyspnea Nonspecific chest pain Sleep disturbance  Dm II Copd  Plan See insturctions   You have many problems, and we will try and treat some of them. You need to return in about 10-14 days to get rechecked.    Stop the doxazosin, but continue the tamsulosin. This is to help you urinate better.    Take the antibiotic pill twice daily at breakfast and supper for the bladder infection (sulfamethoxazole)    Get a battery for your meter so you can check your sugars. Resumed taking the metformin twice daily for the diabetes    Continue taking the gabapentin for the pain in the feet and legs that is being caused by your poorly controlled diabetes.    Continue using your breathing medications    Take MiraLax (can be purchased without a prescription over the counter) one dose daily until bowels are moving better, then just use it when necessary. It is a powder that needs to be started in a glass of water or juice. Follow the directions on the packet.    Take the trazodone 25 mg (one half of a 50 mg pill) at bedtime for sleepTake MiraLax (can be purchased without a prescription over the counter) one dose  daily until bowels are moving better, then just use it when necessary. It is a powder that needs to be started in a glass of water or juice. Follow the d                                                                Take the trazodone 25 mg (one half of a 50 mg pill) at bedtime for sleep

## 2012-09-17 ENCOUNTER — Encounter: Payer: Self-pay | Admitting: Family Medicine

## 2012-11-06 ENCOUNTER — Inpatient Hospital Stay (HOSPITAL_COMMUNITY)
Admission: EM | Admit: 2012-11-06 | Discharge: 2012-11-10 | DRG: 872 | Disposition: A | Discharge status: Home / Self Care | Payer: PRIVATE HEALTH INSURANCE | Attending: Family Medicine | Admitting: Family Medicine

## 2012-11-06 ENCOUNTER — Encounter (HOSPITAL_COMMUNITY): Payer: Self-pay | Admitting: *Deleted

## 2012-11-06 ENCOUNTER — Emergency Department (HOSPITAL_COMMUNITY): Payer: PRIVATE HEALTH INSURANCE

## 2012-11-06 DIAGNOSIS — R9431 Abnormal electrocardiogram [ECG] [EKG]: Secondary | ICD-10-CM

## 2012-11-06 DIAGNOSIS — E785 Hyperlipidemia, unspecified: Secondary | ICD-10-CM | POA: Diagnosis present

## 2012-11-06 DIAGNOSIS — E119 Type 2 diabetes mellitus without complications: Secondary | ICD-10-CM

## 2012-11-06 DIAGNOSIS — E876 Hypokalemia: Secondary | ICD-10-CM | POA: Diagnosis not present

## 2012-11-06 DIAGNOSIS — R32 Unspecified urinary incontinence: Secondary | ICD-10-CM | POA: Diagnosis not present

## 2012-11-06 DIAGNOSIS — Q248 Other specified congenital malformations of heart: Secondary | ICD-10-CM

## 2012-11-06 DIAGNOSIS — R338 Other retention of urine: Secondary | ICD-10-CM | POA: Diagnosis present

## 2012-11-06 DIAGNOSIS — N401 Enlarged prostate with lower urinary tract symptoms: Secondary | ICD-10-CM | POA: Diagnosis present

## 2012-11-06 DIAGNOSIS — K219 Gastro-esophageal reflux disease without esophagitis: Secondary | ICD-10-CM | POA: Diagnosis present

## 2012-11-06 DIAGNOSIS — J449 Chronic obstructive pulmonary disease, unspecified: Secondary | ICD-10-CM | POA: Diagnosis present

## 2012-11-06 DIAGNOSIS — I10 Essential (primary) hypertension: Secondary | ICD-10-CM | POA: Diagnosis present

## 2012-11-06 DIAGNOSIS — J984 Other disorders of lung: Secondary | ICD-10-CM

## 2012-11-06 DIAGNOSIS — J4489 Other specified chronic obstructive pulmonary disease: Secondary | ICD-10-CM | POA: Diagnosis present

## 2012-11-06 DIAGNOSIS — E1149 Type 2 diabetes mellitus with other diabetic neurological complication: Secondary | ICD-10-CM | POA: Diagnosis present

## 2012-11-06 DIAGNOSIS — I4510 Unspecified right bundle-branch block: Secondary | ICD-10-CM | POA: Diagnosis present

## 2012-11-06 DIAGNOSIS — N138 Other obstructive and reflux uropathy: Secondary | ICD-10-CM | POA: Diagnosis present

## 2012-11-06 DIAGNOSIS — N390 Urinary tract infection, site not specified: Secondary | ICD-10-CM | POA: Diagnosis present

## 2012-11-06 DIAGNOSIS — D696 Thrombocytopenia, unspecified: Secondary | ICD-10-CM | POA: Diagnosis not present

## 2012-11-06 DIAGNOSIS — E1142 Type 2 diabetes mellitus with diabetic polyneuropathy: Secondary | ICD-10-CM | POA: Diagnosis present

## 2012-11-06 DIAGNOSIS — A419 Sepsis, unspecified organism: Principal | ICD-10-CM | POA: Diagnosis present

## 2012-11-06 LAB — BASIC METABOLIC PANEL
BUN: 21 mg/dL (ref 6–23)
CO2: 23 mEq/L (ref 19–32)
Calcium: 8.4 mg/dL (ref 8.4–10.5)
Creatinine, Ser: 0.67 mg/dL (ref 0.50–1.35)

## 2012-11-06 LAB — POCT I-STAT, CHEM 8
Calcium, Ion: 1.21 mmol/L (ref 1.13–1.30)
Glucose, Bld: 153 mg/dL — ABNORMAL HIGH (ref 70–99)
HCT: 45 % (ref 39.0–52.0)
Hemoglobin: 15.3 g/dL (ref 13.0–17.0)
TCO2: 30 mmol/L (ref 0–100)

## 2012-11-06 LAB — LIPID PANEL
Cholesterol: 143 mg/dL (ref 0–200)
LDL Cholesterol: 78 mg/dL (ref 0–99)
VLDL: 8 mg/dL (ref 0–40)

## 2012-11-06 LAB — COMPREHENSIVE METABOLIC PANEL
Alkaline Phosphatase: 72 U/L (ref 39–117)
BUN: 23 mg/dL (ref 6–23)
Chloride: 101 mEq/L (ref 96–112)
GFR calc Af Amer: >90 mL/min (ref >90)
GFR calc non Af Amer: 88 mL/min — ABNORMAL LOW (ref >90)
Glucose, Bld: 160 mg/dL — ABNORMAL HIGH (ref 70–99)
Potassium: 3.7 mEq/L (ref 3.5–5.1)
Total Bilirubin: 0.2 mg/dL — ABNORMAL LOW (ref 0.3–1.2)
Total Protein: 7.6 g/dL (ref 6.0–8.3)

## 2012-11-06 LAB — GLUCOSE, CAPILLARY
Glucose-Capillary: 145 mg/dL — ABNORMAL HIGH (ref 70–99)
Glucose-Capillary: 184 mg/dL — ABNORMAL HIGH (ref 70–99)
Glucose-Capillary: 148 mg/dL — ABNORMAL HIGH (ref 70–99)

## 2012-11-06 LAB — URINALYSIS, ROUTINE W REFLEX MICROSCOPIC
Bilirubin Urine: NEGATIVE
Glucose, UA: 100 mg/dL — AB
Ketones, ur: NEGATIVE mg/dL
Protein, ur: NEGATIVE mg/dL

## 2012-11-06 LAB — URINE MICROSCOPIC-ADD ON

## 2012-11-06 LAB — CBC
HCT: 40.5 % (ref 39.0–52.0)
Hemoglobin: 13.4 g/dL (ref 13.0–17.0)
MCH: 29 pg (ref 26.0–34.0)
MCHC: 33.1 g/dL (ref 30.0–36.0)
MCHC: 33.1 g/dL (ref 30.0–36.0)
MCV: 87.6 fL (ref 78.0–100.0)
Platelets: 124 10*3/uL — ABNORMAL LOW (ref 150–400)

## 2012-11-06 LAB — TROPONIN I
Troponin I: <0.3 ng/mL (ref <0.30)
Troponin I: <0.3 ng/mL (ref <0.30)

## 2012-11-06 LAB — POCT I-STAT TROPONIN I: Troponin i, poc: 0 ng/mL (ref 0.00–0.08)

## 2012-11-06 LAB — TSH: TSH: 0.846 u[IU]/mL (ref 0.350–4.500)

## 2012-11-06 MED ORDER — LISINOPRIL-HYDROCHLOROTHIAZIDE 10-12.5 MG PO TABS: 1.0000 | ORAL_TABLET | Freq: Every day | ORAL | Status: DC

## 2012-11-06 MED ORDER — ACETAMINOPHEN 325 MG PO TABS
650.0000 mg | ORAL_TABLET | Freq: Four times a day (QID) | ORAL | Status: DC | PRN
  Administered 2012-11-06 (×2): 650 mg via ORAL
  Filled 2012-11-06 (×2): qty 2

## 2012-11-06 MED ORDER — GABAPENTIN 100 MG PO CAPS: 200.0000 mg | ORAL_CAPSULE | Freq: Three times a day (TID) | ORAL | Status: DC

## 2012-11-06 MED ORDER — TAMSULOSIN HCL 0.4 MG PO CAPS
0.8000 mg | ORAL_CAPSULE | Freq: Every day | ORAL | Status: DC
  Administered 2012-11-06 – 2012-11-10 (×5): 0.8 mg via ORAL
  Filled 2012-11-06 (×7): qty 2

## 2012-11-06 MED ORDER — SODIUM CHLORIDE 0.9 % IJ SOLN
3.0000 mL | Freq: Two times a day (BID) | INTRAMUSCULAR | Status: DC
  Administered 2012-11-07 – 2012-11-10 (×4): 3 mL via INTRAVENOUS

## 2012-11-06 MED ORDER — TIOTROPIUM BROMIDE MONOHYDRATE 18 MCG IN CAPS
18.0000 ug | ORAL_CAPSULE | Freq: Every day | RESPIRATORY_TRACT | Status: DC
  Administered 2012-11-06 – 2012-11-10 (×5): 18 ug via RESPIRATORY_TRACT
  Filled 2012-11-06: qty 5

## 2012-11-06 MED ORDER — ONDANSETRON HCL 4 MG/2ML IJ SOLN
4.0000 mg | Freq: Once | INTRAMUSCULAR | Status: AC
  Administered 2012-11-06: 4 mg via INTRAVENOUS
  Filled 2012-11-06: qty 2

## 2012-11-06 MED ORDER — ALBUTEROL SULFATE HFA 108 (90 BASE) MCG/ACT IN AERS
2.0000 | INHALATION_SPRAY | RESPIRATORY_TRACT | Status: DC | PRN
  Filled 2012-11-06: qty 6.7

## 2012-11-06 MED ORDER — MOMETASONE FURO-FORMOTEROL FUM 200-5 MCG/ACT IN AERO
2.0000 | INHALATION_SPRAY | Freq: Two times a day (BID) | RESPIRATORY_TRACT | Status: DC
  Administered 2012-11-06 – 2012-11-10 (×9): 2 via RESPIRATORY_TRACT
  Filled 2012-11-06: qty 8.8

## 2012-11-06 MED ORDER — CEPHALEXIN 500 MG PO CAPS
500.0000 mg | ORAL_CAPSULE | Freq: Three times a day (TID) | ORAL | Status: DC
  Administered 2012-11-06: 500 mg via ORAL
  Filled 2012-11-06 (×2): qty 1

## 2012-11-06 MED ORDER — LISINOPRIL 10 MG PO TABS
10.0000 mg | ORAL_TABLET | Freq: Every day | ORAL | Status: DC
  Administered 2012-11-06: 10 mg via ORAL
  Filled 2012-11-06 (×2): qty 1

## 2012-11-06 MED ORDER — HEPARIN SODIUM (PORCINE) 5000 UNIT/ML IJ SOLN
5000.0000 [IU] | Freq: Three times a day (TID) | INTRAMUSCULAR | Status: DC
  Administered 2012-11-06 – 2012-11-10 (×10): 5000 [IU] via SUBCUTANEOUS
  Filled 2012-11-06 (×17): qty 1

## 2012-11-06 MED ORDER — TAMSULOSIN HCL 0.4 MG PO CAPS: 0.4000 mg | ORAL_CAPSULE | Freq: Every day | ORAL | Status: DC

## 2012-11-06 MED ORDER — HYDROCHLOROTHIAZIDE 12.5 MG PO CAPS
12.5000 mg | ORAL_CAPSULE | Freq: Every day | ORAL | Status: DC
  Administered 2012-11-06: 12.5 mg via ORAL
  Filled 2012-11-06 (×2): qty 1

## 2012-11-06 MED ORDER — SODIUM CHLORIDE 0.9 % IV SOLN
INTRAVENOUS | Status: DC
  Administered 2012-11-06 – 2012-11-09 (×5): via INTRAVENOUS

## 2012-11-06 MED ORDER — CEPHALEXIN 500 MG PO CAPS
500.0000 mg | ORAL_CAPSULE | Freq: Three times a day (TID) | ORAL | Status: DC
  Administered 2012-11-06 – 2012-11-07 (×2): 500 mg via ORAL
  Filled 2012-11-06 (×6): qty 1

## 2012-11-06 MED ORDER — INSULIN ASPART 100 UNIT/ML ~~LOC~~ SOLN
0.0000 [IU] | Freq: Three times a day (TID) | SUBCUTANEOUS | Status: DC
  Administered 2012-11-06: 1 [IU] via SUBCUTANEOUS
  Administered 2012-11-06: 2 [IU] via SUBCUTANEOUS
  Administered 2012-11-06 – 2012-11-07 (×2): 1 [IU] via SUBCUTANEOUS
  Administered 2012-11-07: 2 [IU] via SUBCUTANEOUS
  Administered 2012-11-07: 1 [IU] via SUBCUTANEOUS
  Administered 2012-11-08: 2 [IU] via SUBCUTANEOUS
  Administered 2012-11-08 – 2012-11-10 (×3): 1 [IU] via SUBCUTANEOUS

## 2012-11-06 MED ORDER — ASPIRIN EC 81 MG PO TBEC
81.0000 mg | DELAYED_RELEASE_TABLET | Freq: Every day | ORAL | Status: DC
  Administered 2012-11-06 – 2012-11-10 (×5): 81 mg via ORAL
  Filled 2012-11-06 (×5): qty 1

## 2012-11-06 MED ORDER — LISINOPRIL-HYDROCHLOROTHIAZIDE 10-12.5 MG PO TABS
1.0000 | ORAL_TABLET | Freq: Every day | ORAL | Status: DC
Start: 2012-11-06 — End: 2012-11-06

## 2012-11-06 MED ORDER — HYDROMORPHONE HCL PF 1 MG/ML IJ SOLN
1.0000 mg | Freq: Once | INTRAMUSCULAR | Status: AC
  Administered 2012-11-06: 1 mg via INTRAVENOUS
  Filled 2012-11-06: qty 1

## 2012-11-06 MED ORDER — GABAPENTIN 100 MG PO CAPS
100.0000 mg | ORAL_CAPSULE | Freq: Three times a day (TID) | ORAL | Status: DC
  Administered 2012-11-06 (×3): 100 mg via ORAL
  Filled 2012-11-06 (×4): qty 1

## 2012-11-06 MED ORDER — ACETAMINOPHEN 650 MG RE SUPP: 650.0000 mg | Freq: Four times a day (QID) | RECTAL | Status: DC | PRN

## 2012-11-06 MED ORDER — SIMVASTATIN 20 MG PO TABS
20.0000 mg | ORAL_TABLET | Freq: Every day | ORAL | Status: DC
  Administered 2012-11-06 – 2012-11-10 (×5): 20 mg via ORAL
  Filled 2012-11-06 (×6): qty 1

## 2012-11-06 MED ORDER — MONTELUKAST SODIUM 10 MG PO TABS
10.0000 mg | ORAL_TABLET | Freq: Every day | ORAL | Status: DC
  Administered 2012-11-06 – 2012-11-09 (×4): 10 mg via ORAL
  Filled 2012-11-06 (×5): qty 1

## 2012-11-06 MED ORDER — ONDANSETRON HCL 4 MG PO TABS: 4.0000 mg | ORAL_TABLET | Freq: Four times a day (QID) | ORAL | Status: DC | PRN

## 2012-11-06 MED ORDER — ONDANSETRON HCL 4 MG/2ML IJ SOLN: 4.0000 mg | Freq: Four times a day (QID) | INTRAMUSCULAR | Status: DC | PRN

## 2012-11-06 MED ORDER — TAMSULOSIN HCL 0.4 MG PO CAPS
0.4000 mg | ORAL_CAPSULE | Freq: Every day | ORAL | Status: DC
  Filled 2012-11-06: qty 1

## 2012-11-06 MED ORDER — PANTOPRAZOLE SODIUM 40 MG PO TBEC
40.0000 mg | DELAYED_RELEASE_TABLET | Freq: Every day | ORAL | Status: DC
  Administered 2012-11-06 – 2012-11-10 (×4): 40 mg via ORAL
  Filled 2012-11-06 (×4): qty 1

## 2012-11-06 MED ORDER — HYDROMORPHONE HCL PF 1 MG/ML IJ SOLN
INTRAMUSCULAR | Status: AC
  Filled 2012-11-06: qty 1

## 2012-11-06 MED ORDER — TAMSULOSIN HCL 0.4 MG PO CAPS: 0.8000 mg | ORAL_CAPSULE | Freq: Every day | ORAL | Status: DC

## 2012-11-06 NOTE — H&P (Signed)
I have seen and examined this patient. I have discussed with Dr Elwyn Reach.  I agree with their findings and plans as documented in their admission note.  I walked with patient in hall.  While a little unsteady initially, he had no increase pain with walking.  The pain is primarily in his feet. I would suggest increase in his gabapentin dose.  Can have patient use low dose of Tramadol as needed for times when neuropathy pain worsens.   Pt stable for discharge. Follow up within a week at [facility] with PCP or other provider.

## 2012-11-06 NOTE — ED Notes (Signed)
Patient here c/o leg pain.  Hx of diabetes

## 2012-11-06 NOTE — Consult Note (Signed)
Admit date: 11/06/2012 Referring Physician  Dr. Konrad Dolores [facility]) Primary Physician No primary provider on file. Primary Cardiologist  None Reason for Consultation  ?Brugada pattern on ECG  HPI: 79 year old male who presents with leg pain to the emergency room. EKG was performed and originally code STEMI was called because of ST segment elevation/changes in V1 and V2. This was canceled. The ER physician however noted Brugada pattern. Overnight, he was observed on telemetry and did not have any evidence of ventricular arrhythmias. He has no history of syncope/sudden death.  I have reviewed his prior EKGs and previously, V1 and V2 are without significant elevation. Prior baseline does not demonstrate Brugada pattern.  EKG currently reassuring.   He has no complaints of chest pain, never has had any chest discomfort. No palpitations. His daughter is available on cell phone. He does not speak English very well.   PMH:   Past Medical History  Diagnosis Date  . Diabetes mellitus   . Hyperlipidemia   . Hypertension     PSH:  History reviewed. No pertinent past surgical history. Allergies:  Review of patient's allergies indicates no known allergies. Prior to Admit Meds:   Prescriptions prior to admission  Medication Sig Dispense Refill  . albuterol (VENTOLIN HFA) 108 (90 BASE) MCG/ACT inhaler Inhale 1-2 puffs every 4 hours as needed for breathing difficulties       . Fluticasone-Salmeterol (ADVAIR) 500-50 MCG/DOSE AEPB Inhale 1 puff into the lungs 2 (two) times daily.  60 each  11  . gabapentin (NEURONTIN) 100 MG capsule Take 1 capsule (100 mg total) by mouth 3 (three) times daily.  90 capsule  3  . lisinopril-hydrochlorothiazide (PRINZIDE,ZESTORETIC) 10-12.5 MG per tablet Take 1 tablet by mouth daily.      . metFORMIN (GLUCOPHAGE) 500 MG tablet Take 1 tablet (500 mg total) by mouth 2 (two) times daily with a meal. 1 tablet by mouth twice daily for diabetes  60 tablet  5  . montelukast (SINGULAIR)  10 MG tablet take 1 tablet by mouth once daily  30 tablet  11  . omeprazole (PRILOSEC) 20 MG capsule Take 1 capsule (20 mg total) by mouth daily.  30 capsule  6  . pravastatin (PRAVACHOL) 40 MG tablet Take 1 tablet (40 mg total) by mouth daily.  30 tablet  5  . sulfamethoxazole-trimethoprim (BACTRIM DS,SEPTRA DS) 800-160 MG per tablet Take 1 tablet by mouth 2 (two) times daily.  14 tablet  0  . Tamsulosin HCl (FLOMAX) 0.4 MG CAPS Take 1 capsule (0.4 mg total) by mouth daily.  30 capsule  2  . tiotropium (SPIRIVA) 18 MCG inhalation capsule Place 1 capsule (18 mcg total) into inhaler and inhale daily. 1 cap inhaled daily  30 capsule  11   Fam HX:   History reviewed. No pertinent family history. Social HX:    History   Social History  . Marital Status: Widowed    Spouse Name: N/A    Number of Children: N/A  . Years of Education: N/A   Occupational History  . Not on file.   Social History Main Topics  . Smoking status: Never Smoker   . Smokeless tobacco: Not on file  . Alcohol Use: No  . Drug Use: No  . Sexually Active: Not on file   Other Topics Concern  . Not on file   Social History Narrative  . No narrative on file     ROS: Positive for leg pain All 11 ROS were addressed and are  negative except what is stated in the HPI  Physical Exam: Blood pressure 146/75, pulse 105, temperature 99 F (37.2 C), temperature source Oral, resp. rate 18, height 5' (1.524 m), weight 52.6 kg (115 lb 15.4 oz), SpO2 98.00%.   General: Well developed, well nourished, in no acute distress Head: Eyes PERRLA, No xanthomas.   Normal cephalic and atramatic  Lungs:   Clear bilaterally to auscultation and percussion. Normal respiratory effort. No wheezes, no rales. Heart:   HRRR S1 S2 Pulses are 2+ & equal.            No carotid bruit. No JVD.  No abdominal bruits. No femoral bruits. Abdomen: Bowel sounds are positive, abdomen soft and non-tender without masses. No hepatosplenomegaly. Msk:  Back  normal. Normal strength and tone for age. Extremities:   No clubbing, cyanosis or edema.  DP +1 Neuro: Alert and oriented X 3, non-focal, MAE x 4 GU: Deferred Rectal: Deferred Psych:  Good affect, responds appropriately    Labs:   Lab Results  Component Value Date   WBC 13.2* 11/06/2012   HGB 15.3 11/06/2012   HCT 45.0 11/06/2012   MCV 87.9 11/06/2012   PLT 164 11/06/2012    Recent Labs Lab 11/06/12 0139  11/06/12 0750  NA 138  < > 140  K 3.7  < > 3.8  CL 101  < > 105  CO2 27  --  23  BUN 23  < > 21  CREATININE 0.72  < > 0.67  CALCIUM 9.3  --  8.4  PROT 7.6  --   --   BILITOT 0.2*  --   --   ALKPHOS 72  --   --   ALT 22  --   --   AST 19  --   --   GLUCOSE 160*  < > 204*  < > = values in this interval not displayed. No results found for this basename: PTT   Lab Results  Component Value Date   INR 1.0 02/28/2008   INR 0.9 11/12/2007   Lab Results  Component Value Date   TROPONINI <0.30 11/06/2012     Lab Results  Component Value Date   LDLCALC 78 11/06/2012   LDLCALC 109* 09/16/2011   LDLCALC 84 10/21/2007   Lab Results  Component Value Date   TRIG 38 11/06/2012   TRIG 102 09/16/2011   TRIG 42 10/21/2007      Radiology:  Dg Chest Portable 1 View  11/06/2012   *RADIOLOGY REPORT*  Clinical Data: Bilateral leg pain.  PORTABLE CHEST - 1 VIEW  Comparison: Chest radiograph performed 09/16/2012  Findings: The lungs are well-aerated and clear.  There is no evidence of focal opacification, pleural effusion or pneumothorax.  The cardiomediastinal silhouette is within normal limits.  No acute osseous abnormalities are seen.  IMPRESSION: No acute cardiopulmonary process seen.   Original Report Authenticated By: Tonia Ghent, M.D.   Personally viewed.  EKG:  EKG from 11/06/12 at 1:21 AM demonstrates sinus tachycardia with left anterior fascicular block, QRS duration of 102 ms, right bundle branch appearance with S-wave in 1, V6 with Type 1 Brugada-like pattern in V1 and V2. Prior  EKGs do not demonstrate this pattern. Prior EKG from 09/16/12 shows no evidence of ST segment elevation in V1 and V2. Personally viewed.   ASSESSMENT/PLAN:   79 year old with EKG last night demonstrating Brugada-like pattern with prior EKGs normal in V1 and V2, however current EKG and previous EKGs show no abnormality in  these leads.  1. Abnormal EKG-I agree that the pattern on EKG from last night in the emergency department was Brugada like however this could be also a transient, perhaps rate related right bundle branch block pattern with his mild tachycardia in the setting of pain. It is reassuring that his previous EKGs as well as his EKG from a few minutes ago did not show any abnormality in these leads. He does have left axis deviation  Without any history of cardiac arrest, or strong family history of sudden cardiac death, no further cardiac workup necessary.  Troponin normal. No evidence of acute coronary syndrome.  Please call with any questions. From a cardiac standpoint, should be fine for discharge. Leg pain per primary team.   Donato Schultz, MD  11/06/2012  11:53 AM

## 2012-11-06 NOTE — Progress Notes (Signed)
Chaplain responded to a code stemi page to ED Trauma B. Pt is a vietnamese and was unable to speak english. Doctors cancelled code stemi after their assessment. Chaplain provided ministry of presence. Kelle Darting [phone]  11/06/12 0150  Clinical Encounter Type  Visited With Patient  Visit Type Initial;Spiritual support;Code;ED;Trauma  Referral From Nurse  Spiritual Encounters  Spiritual Needs Other (Comment) (ministry of presence)  Stress Factors  Patient Stress Factors Other (Comment) (confused)

## 2012-11-06 NOTE — ED Provider Notes (Signed)
History     CSN: [id number]  Arrival date & time 11/06/12  0114   First MD Initiated Contact with Patient 11/06/12 0128      No chief complaint on file.   (Consider location/radiation/quality/duration/timing/severity/associated sxs/prior treatment) HPI Hx per EMS - called out for leg pain, found to have abnormal ECG and code STEMI called in route. Paramedics spoke with family who translated that PT is having severe leg pain and cramps in his feet and calves, has h/o DM and takes neurontin. They report no SOB, CP, some nausea. On arrival to the ED, CAR notified. Per translator, worsening leg pain today now severe, has been having on and off ABD burning, points to his mid ABD, PT attributes this to his medications with h/o same. No CP, no SOB. No back pain or radiating ABD pain. No N/V/D. PT perseverates about severe leg cramping. No known alleviating factors.   PCP- Milas Gain   Past Medical History  Diagnosis Date  . Diabetes mellitus   . Hyperlipidemia   . Hypertension     No past surgical history on file.  No family history on file.  History  Substance Use Topics  . Smoking status: Never Smoker   . Smokeless tobacco: Not on file  . Alcohol Use: No      Review of Systems  Constitutional: Negative for fever and chills.  HENT: Negative for neck pain and neck stiffness.   Eyes: Negative for pain.  Respiratory: Negative for shortness of breath.   Cardiovascular: Negative for chest pain.  Gastrointestinal: Positive for abdominal pain. Negative for vomiting.  Genitourinary: Negative for dysuria.  Musculoskeletal: Negative for back pain.  Skin: Negative for rash and wound.  Neurological: Negative for headaches.  All other systems reviewed and are negative.    Allergies  Review of patient's allergies indicates no known allergies.  Home Medications   Current Outpatient Rx  Name  Route  Sig  Dispense  Refill  . albuterol (VENTOLIN HFA) 108 (90 BASE) MCG/ACT inhaler      Inhale 1-2 puffs every 4 hours as needed for breathing difficulties          . Fluticasone-Salmeterol (ADVAIR) 500-50 MCG/DOSE AEPB   Inhalation   Inhale 1 puff into the lungs 2 (two) times daily.   60 each   11   . gabapentin (NEURONTIN) 100 MG capsule   Oral   Take 1 capsule (100 mg total) by mouth 3 (three) times daily.   90 capsule   3   . lisinopril-hydrochlorothiazide (PRINZIDE,ZESTORETIC) 10-12.5 MG per tablet   Oral   Take 1 tablet by mouth daily.         . metFORMIN (GLUCOPHAGE) 500 MG tablet   Oral   Take 1 tablet (500 mg total) by mouth 2 (two) times daily with a meal. 1 tablet by mouth twice daily for diabetes   60 tablet   5   . montelukast (SINGULAIR) 10 MG tablet      take 1 tablet by mouth once daily   30 tablet   11   . omeprazole (PRILOSEC) 20 MG capsule   Oral   Take 1 capsule (20 mg total) by mouth daily.   30 capsule   6   . pravastatin (PRAVACHOL) 40 MG tablet   Oral   Take 1 tablet (40 mg total) by mouth daily.   30 tablet   5   . sulfamethoxazole-trimethoprim (BACTRIM DS,SEPTRA DS) 800-160 MG per tablet   Oral  Take 1 tablet by mouth 2 (two) times daily.   14 tablet   0   . Tamsulosin HCl (FLOMAX) 0.4 MG CAPS   Oral   Take 1 capsule (0.4 mg total) by mouth daily.   30 capsule   2   . tiotropium (SPIRIVA) 18 MCG inhalation capsule   Inhalation   Place 1 capsule (18 mcg total) into inhaler and inhale daily. 1 cap inhaled daily   30 capsule   11   . traZODone (DESYREL) 50 MG tablet   Oral   Take 0.5 tablets (25 mg total) by mouth at bedtime as needed for sleep.   30 tablet   1     There were no vitals taken for this visit.  Physical Exam  Constitutional: He is oriented to person, place, and time. He appears well-developed and well-nourished.  HENT:  Head: Normocephalic and atraumatic.  Eyes: Conjunctivae and EOM are normal. Pupils are equal, round, and reactive to light.  Neck: Trachea normal. Neck supple. No  thyromegaly present.  Cardiovascular: Normal rate, regular rhythm, S1 normal, S2 normal and normal pulses.     No systolic murmur is present   No diastolic murmur is present  Pulses:      Radial pulses are 2+ on the right side, and 2+ on the left side.  Pulmonary/Chest: Effort normal and breath sounds normal. He has no wheezes. He has no rhonchi. He has no rales. He exhibits no tenderness.  Abdominal: Soft. Normal appearance and bowel sounds are normal. There is no tenderness. There is no CVA tenderness and negative Murphy's sign.  Musculoskeletal:  calves nontender, no cords or erythema, negative Homans sign  Neurological: He is alert and oriented to person, place, and time. He has normal strength. No cranial nerve deficit or sensory deficit. GCS eye subscore is 4. GCS verbal subscore is 5. GCS motor subscore is 6.  Skin: Skin is warm and dry. No rash noted. He is not diaphoretic.  Psychiatric: His speech is normal.  Cooperative and appropriate    ED Course  Procedures (including critical care time)  Results for orders placed during the hospital encounter of 11/06/12  GLUCOSE, CAPILLARY      Result Value Range   Glucose-Capillary 145 (*) 70 - 99 mg/dL  CBC      Result Value Range   WBC 13.2 (*) 4.0 - 10.5 K/uL   RBC 4.61  4.22 - 5.81 MIL/uL   Hemoglobin 13.4  13.0 - 17.0 g/dL   HCT 16.1  09.6 - 04.5 %   MCV 87.9  78.0 - 100.0 fL   MCH 29.1  26.0 - 34.0 pg   MCHC 33.1  30.0 - 36.0 g/dL   RDW 40.9  81.1 - 91.4 %   Platelets 164  150 - 400 K/uL  POCT I-STAT, CHEM 8      Result Value Range   Sodium 143  135 - 145 mEq/L   Potassium 3.9  3.5 - 5.1 mEq/L   Chloride 105  96 - 112 mEq/L   BUN 24 (*) 6 - 23 mg/dL   Creatinine, Ser 7.82  0.50 - 1.35 mg/dL   Glucose, Bld 956 (*) 70 - 99 mg/dL   Calcium, Ion 2.13  0.86 - 1.30 mmol/L   TCO2 30  0 - 100 mmol/L   Hemoglobin 15.3  13.0 - 17.0 g/dL   HCT 57.8  46.9 - 62.9 %  POCT I-STAT TROPONIN I      Result Value  Range   Troponin i,  poc 0.00  0.00 - 0.08 ng/mL   Comment 3             Date: 11/06/2012  Rate: 115  Rhythm: sinus tachycardia  QRS Axis: left  Intervals: normal  ST/T Wave abnormalities: ST elevations anteriorly  Conduction Disutrbances:right bundle branch block  Narrative Interpretation: ST with STE V1-V3 with Brugada pattern, no ST depressions.  Old EKG Reviewed: changes noted - previous ECG 2010 NSR no ST changes    1:28 AM Dr Antoine Poche bedside reviewing ECG and evaluating PT, agrees abnl ECG but if no ACS symptoms does not feel he requires emergent cath. I spoke with DR Eldridge Dace on call for STEMI - plan interpretor phone now and if no ACS symptoms cancel code STEMI. After history obtained, Dr Antoine Poche canceled CODE STEMI - Cardiology will follow as needed.   IV Dilaudid provided  MED consult, FP admit  MDM  Leg pain h/o DM on gabapentin  Abnormal ECG  Serial exams - improved with IV narcotics  Xray, labs  MED admit   Sunnie Nielsen, MD 11/07/12 0502

## 2012-11-06 NOTE — Progress Notes (Signed)
Pt c/o worsening abdominal pain and burning at tip of penis 45 mins after foley inserted. Explained importance and rationale for foley but patient adament about taking it out. Spoke with Dr. Konrad Dolores regarding this matter, stated ok to remove foley. Needs bladder scan at midnight tonight. If >300, in and out cath. MD will readdress situation in the morning. Foley removed at 1815. Pt currently resting more comfortably. Condom cath placed per pt request. Will continue to monitor.

## 2012-11-06 NOTE — ED Notes (Signed)
Patient arrives via EMS with c/o leg pain (EMS spoke with daughter since patient speaks [place]  Adm 324mg  ASA , CBG 124

## 2012-11-06 NOTE — Progress Notes (Signed)
Order received for I &O cath...received 850cc of foul smelling, dark amber colored urine. Sent to lab per order. MD made aware. Order received to place foley for bladder decompression. Will insert foley as ordered.

## 2012-11-06 NOTE — H&P (Signed)
Family Medicine Teaching [facility] Admission History and Physical Service Pager: [phone]  Patient name: Nathaniel [name] Medical record number: [id number] Date of birth: March 05, 1934 Age: 79 y.o. Gender: male  Primary Care Provider: [facility]  Chief Complaint: leg pain  Assessment and Plan: Nathaniel [name] is a 79 y.o. year old male presenting with leg pain, found to have EKG abnormalities suggestive of possible Brugada syndrome. Will admit patient to telemetry to cycle his cardiac enzymes and monitor. Will consult cardiology in AM to have them evaluate patient and make recommendations.  # EKG abnormalities: brugada pattern vs syndrome - cycle troponins - monitor on telemetry - aspirin daily 81mg  - fasting lipid panel, TSH in AM for risk stratification - consider echo - EKG in AM - consult cardiology - may need ICD if brugada syndrome  # COPD: - continue home advair, albuterol, spiriva  # Leg pain: exam not suggestive of anything worrisome (no swelling or erythema, calves nontender to palpation) such as DVT, cellulitits, injury.  Potassium wnl.  Likely from diabetic peripheral neuropathy. - continue gabapentin for peripheral neuropathy - may need to titrate up as he is on a low dose currently  # Diabetes: hold home metformin - sensitive SSI while hospitalized - A1c 7.2 on 09/16/12  # FEN/GI: - NS @ 125 cc/hr - continue home PPI - carb modified diet  # Prophylaxis: - SQ heparin  # Dispo:  - pending clinical improvement and cardiology evaluation  # Code Status: discussed with patient, he desires to be full code  History of Present Illness: Nathaniel [name] is a 79 y.o. year old male who presented to the ER after calling EMS for reported bilateral leg pain.   EMS did a 12 lead EKG and called a code STEMI. Upon arrival to the ER, the code STEMI was cancelled but patient was noted to have an abnormal EKG with a negative troponin. We were called to admit patient due to abnormal EKG  findings.  Patient does not speak English so a phone interpreter was used to obtain history. History is limited by language barrier and by patient being difficult to redirect; he did not answer many of the questions we asked and instead replied by telling us that his leg pain was better. He does endorse a history of diabetes and takes gabapentin for leg pain at home. He does not complain of shortness of breath or chest pain now. His leg pain improved after coming to the ER and receiving dilaudid IV.  It is unclear if he was taking his home medications.  He has had dyspnea and chest pain in the past but none today.  Typically, his daughter acts as an interpreter and we may be able to get better information when the daughter is present later today.  Review Of Systems: Unable to obtain full ROS as patient very difficult to redirect  Patient Active Problem List   Diagnosis Date Noted  . 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 GERD (gastroesophageal reflux disease) 01/03/2012  . HTN (hypertension) 09/16/2011  . Hyperlipidemia 09/16/2011  . Diabetes mellitus 09/16/2011  . Neuropathy 09/16/2011  . PULMONARY NODULE 10/02/2008  . Essential hypertension, benign 01/10/2008  . DIABETES MELLITUS 12/14/2007  . HYPERTHYROIDISM, SUBCLINICAL 11/09/2007  . COPD 11/09/2007  . INSOMNIA 11/09/2007   Past Medical History: Past Medical History  Diagnosis Date  . Diabetes mellitus   . Hyperlipidemia   . Hypertension    Past Surgical History: No past surgical history on file.  Home Medications:  No current facility-administered medications on file prior to encounter.   Current Outpatient Prescriptions on File Prior to Encounter  Medication Sig Dispense Refill  . albuterol (VENTOLIN HFA) 108 (90 BASE) MCG/ACT inhaler Inhale 1-2 puffs every 4 hours as needed for breathing difficulties       . Fluticasone-Salmeterol (ADVAIR) 500-50 MCG/DOSE AEPB Inhale 1 puff into the lungs 2 (two) times daily.  60 each  11   . gabapentin (NEURONTIN) 100 MG capsule Take 1 capsule (100 mg total) by mouth 3 (three) times daily.  90 capsule  3  . lisinopril-hydrochlorothiazide (PRINZIDE,ZESTORETIC) 10-12.5 MG per tablet Take 1 tablet by mouth daily.      . metFORMIN (GLUCOPHAGE) 500 MG tablet Take 1 tablet (500 mg total) by mouth 2 (two) times daily with a meal. 1 tablet by mouth twice daily for diabetes  60 tablet  5  . montelukast (SINGULAIR) 10 MG tablet take 1 tablet by mouth once daily  30 tablet  11  . omeprazole (PRILOSEC) 20 MG capsule Take 1 capsule (20 mg total) by mouth daily.  30 capsule  6  . pravastatin (PRAVACHOL) 40 MG tablet Take 1 tablet (40 mg total) by mouth daily.  30 tablet  5  . sulfamethoxazole-trimethoprim (BACTRIM DS,SEPTRA DS) 800-160 MG per tablet Take 1 tablet by mouth 2 (two) times daily.  14 tablet  0  . Tamsulosin HCl (FLOMAX) 0.4 MG CAPS Take 1 capsule (0.4 mg total) by mouth daily.  30 capsule  2  . tiotropium (SPIRIVA) 18 MCG inhalation capsule Place 1 capsule (18 mcg total) into inhaler and inhale daily. 1 cap inhaled daily  30 capsule  11    Social History: History  Substance Use Topics  . Smoking status: Never Smoker   . Smokeless tobacco: Not on file  . Alcohol Use: No   For any additional social history documentation, please refer to relevant sections of EMR.  Family History: No family history on file. Allergies: No Known Allergies  Physical Exam: BP 151/87  Pulse 113  Temp(Src) 99.1 F (37.3 C) (Oral)  Resp 18  SpO2 98% Exam: General: NAD. Pleasant. HEENT: normocephalic, atraumatic. Tongue is moist. Cardiovascular: tachycardic, regular rhythm Respiratory: CTAB via anterior auscultation. NWOB. Abdomen: nontender to palpation. Pt seems to voluntarily flex abdominal muscles but does not appear to be in any pain. Extremities: no appreciable edema in bilateral lower extremities. 2+DP pulses bilaterally. No erythema, negative Homan's bilaterally Skin: no rashes  noted Neuro: grossly nonfocal, speech intact. Oriented to person and date, says he is in an "apartment' but it is not clear whether this was truly translated correctly  Labs and Imaging: CBC  Recent Labs Lab 11/06/12 0139 11/06/12 0140  WBC 13.2*  --   HGB 13.4 15.3  HCT 40.5 45.0  PLT 164  --      CMET  Recent Labs Lab 11/06/12 0139 11/06/12 0140  NA 138 143  K 3.7 3.9  CL 101 105  CO2 27  --   BUN 23 24*  CREATININE 0.72 0.70  GLUCOSE 160* 153*  CALCIUM 9.3  --   AST 19  --   ALT 22  --   ALKPHOS 72  --   PROT 7.6  --   ALBUMIN 4.0  --       Trop neg x 1 EKG ST segment changes in v1, v2  CXR: No acute cardiopulmonary process seen.  Levert Feinstein, MD Family Medicine PGY-1   PGY-2 Addendum I have seen and  examined this patient and agree with the above with my edits in pink.   BOOTH, Rosaelena Kemnitz 11/06/2012, 6:48 AM

## 2012-11-06 NOTE — ED Notes (Signed)
Patient presents via EMS with c/o leg pain and epigastric area   Int line used for [place] and Dr. Broadus John spoke with the patient.  Int #[id number]

## 2012-11-07 DIAGNOSIS — N390 Urinary tract infection, site not specified: Secondary | ICD-10-CM

## 2012-11-07 DIAGNOSIS — M79609 Pain in unspecified limb: Secondary | ICD-10-CM

## 2012-11-07 LAB — GLUCOSE, CAPILLARY: Glucose-Capillary: 155 mg/dL — ABNORMAL HIGH (ref 70–99)

## 2012-11-07 LAB — CBC
MCH: 29 pg (ref 26.0–34.0)
Platelets: 136 10*3/uL — ABNORMAL LOW (ref 150–400)
RBC: 4.03 MIL/uL — ABNORMAL LOW (ref 4.22–5.81)
RDW: 14.3 % (ref 11.5–15.5)

## 2012-11-07 LAB — BASIC METABOLIC PANEL
Calcium: 8 mg/dL — ABNORMAL LOW (ref 8.4–10.5)
Creatinine, Ser: 0.88 mg/dL (ref 0.50–1.35)
GFR calc non Af Amer: 81 mL/min — ABNORMAL LOW (ref >90)
Glucose, Bld: 154 mg/dL — ABNORMAL HIGH (ref 70–99)
Sodium: 136 mEq/L (ref 135–145)

## 2012-11-07 LAB — MRSA PCR SCREENING: MRSA by PCR: NEGATIVE

## 2012-11-07 MED ORDER — GABAPENTIN 100 MG PO CAPS
200.0000 mg | ORAL_CAPSULE | Freq: Three times a day (TID) | ORAL | Status: DC
  Administered 2012-11-07 – 2012-11-10 (×12): 200 mg via ORAL
  Filled 2012-11-07 (×14): qty 2

## 2012-11-07 MED ORDER — POTASSIUM CHLORIDE CRYS ER 20 MEQ PO TBCR
40.0000 meq | EXTENDED_RELEASE_TABLET | Freq: Two times a day (BID) | ORAL | Status: AC
  Administered 2012-11-07 (×2): 40 meq via ORAL
  Filled 2012-11-07 (×2): qty 2

## 2012-11-07 MED ORDER — MORPHINE SULFATE 4 MG/ML IJ SOLN
4.0000 mg | INTRAMUSCULAR | Status: DC | PRN
  Administered 2012-11-07: 4 mg via INTRAVENOUS
  Filled 2012-11-07: qty 1

## 2012-11-07 MED ORDER — TRAZODONE HCL 50 MG PO TABS
50.0000 mg | ORAL_TABLET | Freq: Every evening | ORAL | Status: DC | PRN
  Administered 2012-11-07: 50 mg via ORAL
  Filled 2012-11-07: qty 1

## 2012-11-07 MED ORDER — LIDOCAINE HCL 2 % EX GEL
Freq: Once | CUTANEOUS | Status: AC
  Administered 2012-11-07: 5 via TOPICAL
  Filled 2012-11-07: qty 5

## 2012-11-07 MED ORDER — PIPERACILLIN-TAZOBACTAM 3.375 G IVPB
3.3750 g | Freq: Three times a day (TID) | INTRAVENOUS | Status: DC
  Administered 2012-11-07 – 2012-11-09 (×7): 3.375 g via INTRAVENOUS
  Filled 2012-11-07 (×9): qty 50

## 2012-11-07 MED ORDER — SODIUM CHLORIDE 0.9 % IV BOLUS (SEPSIS)
1000.0000 mL | Freq: Once | INTRAVENOUS | Status: AC
  Administered 2012-11-07: 1000 mL via INTRAVENOUS

## 2012-11-07 MED ORDER — SODIUM CHLORIDE 0.9 % IV SOLN
500.0000 mg | Freq: Two times a day (BID) | INTRAVENOUS | Status: DC
  Administered 2012-11-07 – 2012-11-09 (×5): 500 mg via INTRAVENOUS
  Filled 2012-11-07 (×8): qty 500

## 2012-11-07 MED ORDER — PHENAZOPYRIDINE HCL 200 MG PO TABS
200.0000 mg | ORAL_TABLET | Freq: Three times a day (TID) | ORAL | Status: DC
  Administered 2012-11-07 – 2012-11-09 (×9): 200 mg via ORAL
  Filled 2012-11-07 (×12): qty 1

## 2012-11-07 NOTE — Progress Notes (Signed)
Family Practice Teaching Service Interval Progress Note  Called to see pt by RN who reports that pt is refusing foley catheter. Spoke to patient using [place] phone interpreter. He is adamant that he does not want a foley catheter placed, despite the risks to his health of not draining his bladder. He refused, saying that he "would rather die". The foley causes him great pain when it is inserted and left. He did, however, consent to in and out catheterizations every few hours. Will order in&out cath every 3 hours and monitor urine output. Pt denies being in pain at this time. He did give me permission to speak with his family about his care should they arrive later.  Levert Feinstein, MD Family Medicine PGY-1 Service Pager [phone]

## 2012-11-07 NOTE — Progress Notes (Signed)
Family Medicine Teaching Service Daily Progress Note Service Pager: [phone]  Patient Assessment: Nathaniel [name] is a 79 y.o. year old male presenting with leg pain, initially with Brugada pattern on EKG, now found to have UTI with urinary retention and meeting sepsis criteria.  Subjective: Yesterday pt was going to be discharged, but was noted to have a markedly elevated WBC > 20. Urine sample was obtained by cath, which showed grossly infected urine. PVR >800.  Objective: Temp:  [97.9 F (36.6 C)-98.4 F (36.9 C)] 98.1 F (36.7 C) (05/27 2956) Pulse Rate:  [85-101] 95 (05/27 0613) Resp:  [18] 18 (05/27 0613) BP: (86-127)/(51-70) 92/60 mmHg (05/27 0628) SpO2:  [94 %-96 %] 95 % (05/27 2130) Weight:  [116 lb 13.5 oz (53 kg)] 116 lb 13.5 oz (53 kg) (05/27 8657) Exam: General: no acute distress Cardiovascular: mildly tachycardic but regular rhythm Respiratory: CTAB, normal respiratory effort Abdomen: soft, nondistended, nontender to palpation Neurological: appears grossly intact  I have reviewed the patient's medications, labs, imaging, and diagnostic testing.  Notable results are summarized below.  Medications:  Scheduled Meds: . aspirin EC  81 mg Oral Daily  . cephALEXin  500 mg Oral Q8H  . gabapentin  200 mg Oral TID  . heparin  5,000 Units Subcutaneous Q8H  . hydrochlorothiazide  12.5 mg Oral Daily  . insulin aspart  0-9 Units Subcutaneous TID WC  . lisinopril  10 mg Oral Daily  . mometasone-formoterol  2 puff Inhalation BID  . montelukast  10 mg Oral QHS  . pantoprazole  40 mg Oral Daily  . phenazopyridine  200 mg Oral TID WC  . simvastatin  20 mg Oral q1800  . sodium chloride  3 mL Intravenous Q12H  . tamsulosin  0.8 mg Oral QPC supper  . tiotropium  18 mcg Inhalation Daily   Continuous Infusions: . sodium chloride 125 mL/hr at 11/06/12 0747   PRN Meds:.acetaminophen, acetaminophen, albuterol, morphine injection, ondansetron (ZOFRAN) IV, ondansetron,  traZODone  Labs:  CBC  Recent Labs Lab 11/06/12 0139 11/06/12 0140 11/06/12 0750 11/07/12 0435  WBC 13.2*  --  20.1* 16.4*  HGB 13.4 15.3 12.6* 11.7*  HCT 40.5 45.0 38.1* 35.2*  PLT 164  --  124* 136*    BMET  Recent Labs Lab 11/06/12 0139 11/06/12 0140 11/06/12 0750  NA 138 143 140  K 3.7 3.9 3.8  CL 101 105 105  CO2 27  --  23  BUN 23 24* 21  CREATININE 0.72 0.70 0.67  GLUCOSE 160* 153* 204*  CALCIUM 9.3  --  8.4   Lipids:    Component Value Date/Time   CHOL 143 11/06/2012 0750   TRIG 38 11/06/2012 0750   HDL 57 11/06/2012 0750   LDLDIRECT 92 10/02/2008 2103   VLDL 8 11/06/2012 0750   CHOLHDL 2.5 11/06/2012 0750   TSH 0.846  Urinalysis    Component Value Date/Time   COLORURINE YELLOW 11/06/2012 1559   APPEARANCEUR HAZY* 11/06/2012 1559   LABSPEC 1.020 11/06/2012 1559   PHURINE 5.5 11/06/2012 1559   GLUCOSEU 100* 11/06/2012 1559   HGBUR TRACE* 11/06/2012 1559   BILIRUBINUR NEGATIVE 11/06/2012 1559   BILIRUBINUR neg 09/16/2012 1102   KETONESUR NEGATIVE 11/06/2012 1559   PROTEINUR NEGATIVE 11/06/2012 1559   UROBILINOGEN 0.2 11/06/2012 1559   UROBILINOGEN 0.2 09/16/2012 1102   NITRITE POSITIVE* 11/06/2012 1559   NITRITE pos 09/16/2012 1102   LEUKOCYTESUR LARGE* 11/06/2012 1559  Urine micro TNTC WBC, many bacteria  Urine cx pending  CXR  5/26 no acute process  Assessment/Plan:  Nathaniel [name] is a 79 y.o. year old male presenting with leg pain, initially with Brugada pattern on EKG, now found to have UTI with urinary retention and meeting sepsis criteria.  # UTI with urinary retention, sepsis: - now meets severe sepsis criteria with tachycardia, leukocytosis, hypotension - bolus 1 L NS now, blood cx x2, lactic acid - history of emphysematous cystitis requiring urology consult back in August 2013 - needing caths to empty bladder -> will order foley be placed - urine culture still pending but UA/micro suggestive of UTI - has been on keflex for abx coverage, pyridium for  comfort but in light of sepsis, will broaden abx coverage to vanc/zosyn now - will consult urology today  # EKG abnormalities: brugada pattern vs syndrome  - all troponins negative - repeat EKG improved (LAD but no brugada pattern) - seen by cardiology on 5/26, cleared for discharge from cardiac standpoint - continue monitor on telemetry  - aspirin daily 81mg    # HTN: - hold home meds now in setting of sepsis with hypotension  # COPD:  - continue home advair, albuterol, spiriva   # Leg pain: exam not suggestive of anything worrisome (no swelling or erythema, calves nontender to palpation) such as DVT, cellulitits, injury. Potassium wnl. Likely from diabetic peripheral neuropathy.  - continue gabapentin for peripheral neuropathy - may need to titrate up as he is on a low dose currently   # Diabetes: hold home metformin  - sensitive SSI while hospitalized  - A1c 7.2 on 09/16/12   # FEN/GI:  - continue NS @ 125 cc/hr after 1L bolus - continue home PPI  - carb modified diet   # Prophylaxis:  - SQ heparin   # Dispo:  - transfer to stepdown  # Code Status: discussed with patient upon admission, he desires to be full code  Levert Feinstein, MD [facility] Medicine PGY-1 Service Pager [phone]

## 2012-11-07 NOTE — Progress Notes (Signed)
UR Completed Jaquay Morneault Graves-Bigelow, RN,BSN [phone]  

## 2012-11-07 NOTE — Progress Notes (Signed)
Pt. Admitted to room 3315; bp 115/60 vss.  Pt. Alert and oriented.  Refusing foley placement; pt. Speaks [place]; call light within reach; will continue to monitor.  Vivi Martens RN

## 2012-11-07 NOTE — Progress Notes (Signed)
Bladder scan shows >700 ml urine.  Dr. Konrad Dolores notified.  Orders received.  Report given to [place], [place].

## 2012-11-07 NOTE — Progress Notes (Signed)
Seen and examined.  Agree with Dr. Pollie Meyer.  My main concern is that the patient now meets sepsis criteria and has an obstructive uropathy.  Standard of care is absolutely to drain the obstruction - and Nathaniel [name] is refusing the foley.  Language barrier makes nuanced communication impossible.  Is he really able to understand?  Does he really have capacity?  Uro consulted and we are treating with broad spectrum antibiotics.  Still, I worry about drainage.

## 2012-11-07 NOTE — Progress Notes (Signed)
Subjective:  Denies CP or abd pain. Touched leg and he said better. Wants to go home.   Objective:  Vital Signs in the last 24 hours: Temp:  [97.9 F (36.6 C)-98.4 F (36.9 C)] 98.1 F (36.7 C) (05/27 1610) Pulse Rate:  [85-101] 95 (05/27 0613) Resp:  [18] 18 (05/27 0613) BP: (86-127)/(51-70) 92/60 mmHg (05/27 0628) SpO2:  [94 %-96 %] 95 % (05/27 0613) Weight:  [53 kg (116 lb 13.5 oz)] 53 kg (116 lb 13.5 oz) (05/27 9604)  Intake/Output from previous day: 05/26 0701 - 05/27 0700 In: -  Out: 1850 [Urine:1850]   Physical Exam: General: Thin in no acute distress. Head:  Normocephalic and atraumatic. Lungs: Clear to auscultation and percussion. Heart: Normal S1 and S2.  No murmur, rubs or gallops.  Abdomen: soft, non-tender, positive bowel sounds. Extremities: No clubbing or cyanosis. No edema. No leg pain. Neurologic: Alert and oriented x 3.    Lab Results:  Recent Labs  11/06/12 0750 11/07/12 0435  WBC 20.1* 16.4*  HGB 12.6* 11.7*  PLT 124* 136*    Recent Labs  11/06/12 0139 11/06/12 0140 11/06/12 0750  NA 138 143 140  K 3.7 3.9 3.8  CL 101 105 105  CO2 27  --  23  GLUCOSE 160* 153* 204*  BUN 23 24* 21  CREATININE 0.72 0.70 0.67    Recent Labs  11/06/12 1130 11/06/12 1706  TROPONINI <0.30 <0.30   Hepatic Function Panel  Recent Labs  11/06/12 0139  PROT 7.6  ALBUMIN 4.0  AST 19  ALT 22  ALKPHOS 72  BILITOT 0.2*    Recent Labs  11/06/12 0750  CHOL 143   No results found for this basename: PROTIME,  in the last 72 hours  Imaging: Dg Chest Portable 1 View  11/06/2012   *RADIOLOGY REPORT*  Clinical Data: Bilateral leg pain.  PORTABLE CHEST - 1 VIEW  Comparison: Chest radiograph performed 09/16/2012  Findings: The lungs are well-aerated and clear.  There is no evidence of focal opacification, pleural effusion or pneumothorax.  The cardiomediastinal silhouette is within normal limits.  No acute osseous abnormalities are seen.  IMPRESSION: No  acute cardiopulmonary process seen.   Original Report Authenticated By: Tonia Ghent, M.D.   Personally viewed.   Telemetry: No adverse rhythms Personally viewed.   EKG:  ST V1-2 resolved.    Assessment/Plan:   79 year old with leg pain, abnormal ECG (transient), leukocytosis, urinary retention.    - ECG now reassuring. Trop neg.   - possible rate related, intermittent bundle branch block  - Based upon several baseline ECG's, I do not believe that he has underlying Brugada syndrome  - No prior hx of sudden death.   Will sign off.  Other issues, (leg pain, WBC, urinary retention, per primary team).     Romuald Mccaslin 11/07/2012, 8:40 AM

## 2012-11-07 NOTE — Progress Notes (Signed)
ANTIBIOTIC CONSULT NOTE - INITIAL  Pharmacy Consult for Vancomycin / Zosyn Indication: UTI w/ possible sepsis  No Known Allergies  Patient Measurements: Height: 5' (152.4 cm) Weight: 116 lb 13.5 oz (53 kg) IBW/kg (Calculated) : 50  Vital Signs: Temp: 98.1 F (36.7 C) (05/27 0613) Temp src: Oral (05/27 0613) BP: 92/60 mmHg (05/27 0628) Pulse Rate: 95 (05/27 0613)    Recent Labs  11/06/12 0139 11/06/12 0140 11/06/12 0750 11/07/12 0435  WBC 13.2*  --  20.1* 16.4*  HGB 13.4 15.3 12.6* 11.7*  PLT 164  --  124* 136*  CREATININE 0.72 0.70 0.67  --    Estimated Creatinine Clearance: 54.7 ml/min (by C-G formula based on Cr of 0.67).   Medical History: Past Medical History  Diagnosis Date  . Diabetes mellitus   . Hyperlipidemia   . Hypertension     Assessment: 79yoM with UTI with urinary retention & possible sepsis being started on Vancomycin and Zosyn. Was treated with keflex x 3 doses during this admission and was on bactrim prior to admission. Blood cultures x2 ordered, WBC 16.4, Tmax 98.4'F, Weight 53kg, Scr 0.67.  Goal of Therapy:  Vancomycin trough level 15-20 mcg/ml Clinical improvement  Plan:  1) Vancomycin 500mg  IV q12 hours 2) Zoysn 3.375g IV Q 8 hours (4-hr infusion) 3) Monitor renal function, clinical status, culture data, vancomycin trough at steady state as appropriate.   Benjaman Pott, PharmD, BCPS 11/07/2012   9:12 AM

## 2012-11-07 NOTE — Progress Notes (Signed)
In & Out cath done; 550 cc of orange colored urine drained from bladder per order; will continue to monitor.  Vivi Martens RN

## 2012-11-07 NOTE — Progress Notes (Signed)
Order to insert foley catheter. Pt adamantly refusing foley cath. MD paged and made aware. Pt waiting for bed on 3300. Will continue to monitor. Levonne Spiller, RN

## 2012-11-07 NOTE — Consult Note (Signed)
Urology Consult  Referring physician: Levert Feinstein Reason for referral: Urinary retention  Chief Complaint: Distended bladder  History of Present Illness: 80 years old male admitted with leg pain.  Found to have a distended bladder.  Was catheterized for 850 ml of urine.  Urinalysis showed TNTC WBC's, many bacteria and is nitrite positive.  A Foley catheter was inserted in the bladder but patient could not tolerate it and catheter was removed after 45 minutes.  He has been on in and out catheterization.  He was seen in August last year for urinary retention and emphysematous cystitis.  He refused Foley at that time also.  He has been on tamsulosin.  He was seen in the office in November 2013 and was found to have a large PVR.  He again refused catheterization and has not returned for follow-up.  He is on Zosyn and Vancomycin.  Urine culture is pending.  Past Medical History  Diagnosis Date  . Diabetes mellitus   . Hyperlipidemia   . Hypertension    History reviewed. No pertinent past surgical history.  Medications: Albuterol, Advair, Neurontin, Lisinopril/HCTZ, Metformin, Singulair, Prilosec, Pravachol, tamsulosin, Zosyn, Vancomycin Allergies: No Known Allergies  History reviewed. No pertinent family history. Social History:  reports that he has never smoked. He does not have any smokeless tobacco history on file. He reports that he does not drink alcohol or use illicit drugs.  ROS: All systems are reviewed and negative except as noted.   Physical Exam:  Vital signs in last 24 hours: Temp:  [98.1 F (36.7 C)-98.5 F (36.9 C)] 98.3 F (36.8 C) (05/27 1558) Pulse Rate:  [94-107] 94 (05/27 1535) Resp:  [13-18] 16 (05/27 1535) BP: (86-123)/(51-68) 117/63 mmHg (05/27 1535) SpO2:  [94 %-98 %] 95 % (05/27 1535) Weight:  [53 kg (116 lb 13.5 oz)] 53 kg (116 lb 13.5 oz) (05/27 9811) HEENT: Within normal limits Cardiovascular: Skin warm; not flushed Respiratory: Breaths quiet; no  shortness of breath Abdomen: No masses Neurological: Normal sensation to touch Musculoskeletal: Normal motor function arms and legs Lymphatics: No inguinal adenopathy Skin: No rashes Genitourinary: Penis is normal.  Scrotum is normal in appearance.  There is no testicular mass.  Laboratory Data:  Results for orders placed during the hospital encounter of 11/06/12 (from the past 72 hour(s))  GLUCOSE, CAPILLARY     Status: Abnormal   Collection Time    11/06/12  1:24 AM      Result Value Range   Glucose-Capillary 145 (*) 70 - 99 mg/dL  POCT I-STAT TROPONIN I     Status: None   Collection Time    11/06/12  1:38 AM      Result Value Range   Troponin i, poc 0.00  0.00 - 0.08 ng/mL   Comment 3            Comment: Due to the release kinetics of cTnI,     a negative result within the first hours     of the onset of symptoms does not rule out     myocardial infarction with certainty.     If myocardial infarction is still suspected,     repeat the test at appropriate intervals.  CBC     Status: Abnormal   Collection Time    11/06/12  1:39 AM      Result Value Range   WBC 13.2 (*) 4.0 - 10.5 K/uL   RBC 4.61  4.22 - 5.81 MIL/uL   Hemoglobin 13.4  13.0 -  17.0 g/dL   HCT 16.1  09.6 - 04.5 %   MCV 87.9  78.0 - 100.0 fL   MCH 29.1  26.0 - 34.0 pg   MCHC 33.1  30.0 - 36.0 g/dL   RDW 40.9  81.1 - 91.4 %   Platelets 164  150 - 400 K/uL  COMPREHENSIVE METABOLIC PANEL     Status: Abnormal   Collection Time    11/06/12  1:39 AM      Result Value Range   Sodium 138  135 - 145 mEq/L   Potassium 3.7  3.5 - 5.1 mEq/L   Chloride 101  96 - 112 mEq/L   CO2 27  19 - 32 mEq/L   Glucose, Bld 160 (*) 70 - 99 mg/dL   BUN 23  6 - 23 mg/dL   Creatinine, Ser 7.82  0.50 - 1.35 mg/dL   Calcium 9.3  8.4 - 95.6 mg/dL   Total Protein 7.6  6.0 - 8.3 g/dL   Albumin 4.0  3.5 - 5.2 g/dL   AST 19  0 - 37 U/L   ALT 22  0 - 53 U/L   Alkaline Phosphatase 72  39 - 117 U/L   Total Bilirubin 0.2 (*) 0.3 - 1.2  mg/dL   GFR calc non Af Amer 88 (*) >90 mL/min   GFR calc Af Amer >90  >90 mL/min   Comment:            The eGFR has been calculated     using the CKD EPI equation.     This calculation has not been     validated in all clinical     situations.     eGFR's persistently     <90 mL/min signify     possible Chronic Kidney Disease.  TROPONIN I     Status: None   Collection Time    11/06/12  1:40 AM      Result Value Range   Troponin I <0.30  <0.30 ng/mL   Comment:            Due to the release kinetics of cTnI,     a negative result within the first hours     of the onset of symptoms does not rule out     myocardial infarction with certainty.     If myocardial infarction is still suspected,     repeat the test at appropriate intervals.  POCT I-STAT, CHEM 8     Status: Abnormal   Collection Time    11/06/12  1:40 AM      Result Value Range   Sodium 143  135 - 145 mEq/L   Potassium 3.9  3.5 - 5.1 mEq/L   Chloride 105  96 - 112 mEq/L   BUN 24 (*) 6 - 23 mg/dL   Creatinine, Ser 2.13  0.50 - 1.35 mg/dL   Glucose, Bld 086 (*) 70 - 99 mg/dL   Calcium, Ion 5.78  4.69 - 1.30 mmol/L   TCO2 30  0 - 100 mmol/L   Hemoglobin 15.3  13.0 - 17.0 g/dL   HCT 62.9  52.8 - 41.3 %  GLUCOSE, CAPILLARY     Status: Abnormal   Collection Time    11/06/12  7:18 AM      Result Value Range   Glucose-Capillary 184 (*) 70 - 99 mg/dL   Comment 1 Notify RN    CBC     Status: Abnormal   Collection Time  11/06/12  7:50 AM      Result Value Range   WBC 20.1 (*) 4.0 - 10.5 K/uL   RBC 4.35  4.22 - 5.81 MIL/uL   Hemoglobin 12.6 (*) 13.0 - 17.0 g/dL   Comment: REPEATED TO VERIFY   HCT 38.1 (*) 39.0 - 52.0 %   MCV 87.6  78.0 - 100.0 fL   MCH 29.0  26.0 - 34.0 pg   MCHC 33.1  30.0 - 36.0 g/dL   RDW 16.1  09.6 - 04.5 %   Platelets 124 (*) 150 - 400 K/uL   Comment: REPEATED TO VERIFY  TROPONIN I     Status: None   Collection Time    11/06/12  7:50 AM      Result Value Range   Troponin I <0.30  <0.30  ng/mL   Comment:            Due to the release kinetics of cTnI,     a negative result within the first hours     of the onset of symptoms does not rule out     myocardial infarction with certainty.     If myocardial infarction is still suspected,     repeat the test at appropriate intervals.  BASIC METABOLIC PANEL     Status: Abnormal   Collection Time    11/06/12  7:50 AM      Result Value Range   Sodium 140  135 - 145 mEq/L   Potassium 3.8  3.5 - 5.1 mEq/L   Chloride 105  96 - 112 mEq/L   CO2 23  19 - 32 mEq/L   Glucose, Bld 204 (*) 70 - 99 mg/dL   BUN 21  6 - 23 mg/dL   Creatinine, Ser 4.09  0.50 - 1.35 mg/dL   Calcium 8.4  8.4 - 81.1 mg/dL   GFR calc non Af Amer >90  >90 mL/min   GFR calc Af Amer >90  >90 mL/min   Comment:            The eGFR has been calculated     using the CKD EPI equation.     This calculation has not been     validated in all clinical     situations.     eGFR's persistently     <90 mL/min signify     possible Chronic Kidney Disease.  LIPID PANEL     Status: None   Collection Time    11/06/12  7:50 AM      Result Value Range   Cholesterol 143  0 - 200 mg/dL   Triglycerides 38  <914 mg/dL   HDL 57  >78 mg/dL   Total CHOL/HDL Ratio 2.5     VLDL 8  0 - 40 mg/dL   LDL Cholesterol 78  0 - 99 mg/dL   Comment:            Total Cholesterol/HDL:CHD Risk     Coronary Heart Disease Risk Table                         Men   Women      1/2 Average Risk   3.4   3.3      Average Risk       5.0   4.4      2 X Average Risk   9.6   7.1      3 X Average Risk  23.4  11.0                Use the calculated Patient Ratio     above and the CHD Risk Table     to determine the patient's CHD Risk.                ATP III CLASSIFICATION (LDL):      <100     mg/dL   Optimal      161-096  mg/dL   Near or Above                        Optimal      130-159  mg/dL   Borderline      045-409  mg/dL   High      >811     mg/dL   Very High  TSH     Status: None    Collection Time    11/06/12  7:50 AM      Result Value Range   TSH 0.846  0.350 - 4.500 uIU/mL  TROPONIN I     Status: None   Collection Time    11/06/12 11:30 AM      Result Value Range   Troponin I <0.30  <0.30 ng/mL   Comment:            Due to the release kinetics of cTnI,     a negative result within the first hours     of the onset of symptoms does not rule out     myocardial infarction with certainty.     If myocardial infarction is still suspected,     repeat the test at appropriate intervals.  GLUCOSE, CAPILLARY     Status: Abnormal   Collection Time    11/06/12 11:32 AM      Result Value Range   Glucose-Capillary 142 (*) 70 - 99 mg/dL   Comment 1 Notify RN    URINALYSIS, ROUTINE W REFLEX MICROSCOPIC     Status: Abnormal   Collection Time    11/06/12  3:59 PM      Result Value Range   Color, Urine YELLOW  YELLOW   APPearance HAZY (*) CLEAR   Specific Gravity, Urine 1.020  1.005 - 1.030   pH 5.5  5.0 - 8.0   Glucose, UA 100 (*) NEGATIVE mg/dL   Hgb urine dipstick TRACE (*) NEGATIVE   Bilirubin Urine NEGATIVE  NEGATIVE   Ketones, ur NEGATIVE  NEGATIVE mg/dL   Protein, ur NEGATIVE  NEGATIVE mg/dL   Urobilinogen, UA 0.2  0.0 - 1.0 mg/dL   Nitrite POSITIVE (*) NEGATIVE   Leukocytes, UA LARGE (*) NEGATIVE  URINE CULTURE     Status: None   Collection Time    11/06/12  3:59 PM      Result Value Range   Specimen Description URINE, CATHETERIZED     Special Requests NONE     Culture  Setup Time 11/06/2012 17:05     Colony Count PENDING     Culture Culture reincubated for better growth     Report Status PENDING    URINE MICROSCOPIC-ADD ON     Status: Abnormal   Collection Time    11/06/12  3:59 PM      Result Value Range   WBC, UA TOO NUMEROUS TO COUNT  <3 WBC/hpf   Bacteria, UA MANY (*) RARE  GLUCOSE, CAPILLARY     Status: Abnormal   Collection Time  11/06/12  5:03 PM      Result Value Range   Glucose-Capillary 148 (*) 70 - 99 mg/dL  TROPONIN I     Status:  None   Collection Time    11/06/12  5:06 PM      Result Value Range   Troponin I <0.30  <0.30 ng/mL   Comment:            Due to the release kinetics of cTnI,     a negative result within the first hours     of the onset of symptoms does not rule out     myocardial infarction with certainty.     If myocardial infarction is still suspected,     repeat the test at appropriate intervals.  GLUCOSE, CAPILLARY     Status: Abnormal   Collection Time    11/06/12  9:00 PM      Result Value Range   Glucose-Capillary 179 (*) 70 - 99 mg/dL  CBC     Status: Abnormal   Collection Time    11/07/12  4:35 AM      Result Value Range   WBC 16.4 (*) 4.0 - 10.5 K/uL   RBC 4.03 (*) 4.22 - 5.81 MIL/uL   Hemoglobin 11.7 (*) 13.0 - 17.0 g/dL   HCT 16.1 (*) 09.6 - 04.5 %   MCV 87.3  78.0 - 100.0 fL   MCH 29.0  26.0 - 34.0 pg   MCHC 33.2  30.0 - 36.0 g/dL   RDW 40.9  81.1 - 91.4 %   Platelets 136 (*) 150 - 400 K/uL  GLUCOSE, CAPILLARY     Status: Abnormal   Collection Time    11/07/12  7:41 AM      Result Value Range   Glucose-Capillary 134 (*) 70 - 99 mg/dL  LACTIC ACID, PLASMA     Status: None   Collection Time    11/07/12 10:00 AM      Result Value Range   Lactic Acid, Venous 0.8  0.5 - 2.2 mmol/L  BASIC METABOLIC PANEL     Status: Abnormal   Collection Time    11/07/12 10:11 AM      Result Value Range   Sodium 136  135 - 145 mEq/L   Potassium 3.2 (*) 3.5 - 5.1 mEq/L   Chloride 103  96 - 112 mEq/L   CO2 24  19 - 32 mEq/L   Glucose, Bld 154 (*) 70 - 99 mg/dL   BUN 22  6 - 23 mg/dL   Creatinine, Ser 7.82  0.50 - 1.35 mg/dL   Calcium 8.0 (*) 8.4 - 10.5 mg/dL   GFR calc non Af Amer 81 (*) >90 mL/min   GFR calc Af Amer >90  >90 mL/min   Comment:            The eGFR has been calculated     using the CKD EPI equation.     This calculation has not been     validated in all clinical     situations.     eGFR's persistently     <90 mL/min signify     possible Chronic Kidney Disease.   GLUCOSE, CAPILLARY     Status: Abnormal   Collection Time    11/07/12 11:18 AM      Result Value Range   Glucose-Capillary 162 (*) 70 - 99 mg/dL  MRSA PCR SCREENING     Status: None   Collection Time  11/07/12 12:48 PM      Result Value Range   MRSA by PCR NEGATIVE  NEGATIVE   Comment:            The GeneXpert MRSA Assay (FDA     approved for NASAL specimens     only), is one component of a     comprehensive MRSA colonization     surveillance program. It is not     intended to diagnose MRSA     infection nor to guide or     monitor treatment for     MRSA infections.  GLUCOSE, CAPILLARY     Status: Abnormal   Collection Time    11/07/12  4:53 PM      Result Value Range   Glucose-Capillary 146 (*) 70 - 99 mg/dL   Comment 1 Documented in Chart     Comment 2 Notify RN     Recent Results (from the past 240 hour(s))  URINE CULTURE     Status: None   Collection Time    11/06/12  3:59 PM      Result Value Range Status   Specimen Description URINE, CATHETERIZED   Final   Special Requests NONE   Final   Culture  Setup Time 11/06/2012 17:05   Final   Colony Count PENDING   Incomplete   Culture Culture reincubated for better growth   Final   Report Status PENDING   Incomplete  MRSA PCR SCREENING     Status: None   Collection Time    11/07/12 12:48 PM      Result Value Range Status   MRSA by PCR NEGATIVE  NEGATIVE Final   Comment:            The GeneXpert MRSA Assay (FDA     approved for NASAL specimens     only), is one component of a     comprehensive MRSA colonization     surveillance program. It is not     intended to diagnose MRSA     infection nor to guide or     monitor treatment for     MRSA infections.   Creatinine:  Recent Labs  11/06/12 0139 11/06/12 0140 11/06/12 0750 11/07/12 1011  CREATININE 0.72 0.70 0.67 0.88    Xrays: See report/chart   Impression/Assessment:  Urinary retention.  BPH.  Plan:  Continue in and out catheterization.  Could  be done every 6-8 hours depending on amount of urine obtained.  Teach patient self catheterization.  This way he could continue to do it at home.  Could go home on oral antibiotics as per urine culture and sensitivity when ready for discharge  Nathaniel [name] 11/07/2012, 7:11 PM

## 2012-11-08 ENCOUNTER — Observation Stay (HOSPITAL_COMMUNITY): Payer: PRIVATE HEALTH INSURANCE

## 2012-11-08 LAB — BASIC METABOLIC PANEL
BUN: 15 mg/dL (ref 6–23)
CO2: 23 mEq/L (ref 19–32)
Chloride: 108 mEq/L (ref 96–112)
GFR calc non Af Amer: 80 mL/min — ABNORMAL LOW (ref >90)
Glucose, Bld: 151 mg/dL — ABNORMAL HIGH (ref 70–99)
Potassium: 3.8 mEq/L (ref 3.5–5.1)

## 2012-11-08 LAB — URINE CULTURE: Colony Count: >=100000

## 2012-11-08 LAB — GLUCOSE, CAPILLARY: Glucose-Capillary: 138 mg/dL — ABNORMAL HIGH (ref 70–99)

## 2012-11-08 LAB — CBC
HCT: 33.3 % — ABNORMAL LOW (ref 39.0–52.0)
Hemoglobin: 10.9 g/dL — ABNORMAL LOW (ref 13.0–17.0)
MCHC: 32.7 g/dL (ref 30.0–36.0)

## 2012-11-08 MED ORDER — POLYETHYLENE GLYCOL 3350 17 G PO PACK
17.0000 g | PACK | Freq: Every day | ORAL | Status: DC
  Administered 2012-11-08 – 2012-11-10 (×3): 17 g via ORAL
  Filled 2012-11-08 (×3): qty 1

## 2012-11-08 NOTE — Progress Notes (Signed)
Bladder scanned pt and scanner said 630 mL in bladder. In and out cathed pt and only obtained . Will continue to monitor. Pt tolerated well.

## 2012-11-08 NOTE — Progress Notes (Signed)
Seen and examined.  I am very impressed that Dr. Pollie Meyer did the hard work of getting a careful history around his foley refusal.  The I and O cath approach is one I can live with - especially while acutely infected and in hospital.  I still have concerns whether he will continue post discharge and that he will be at increased risk of readmit.  Perhaps we can start of finasteride in addition to flomax.  The effect will be slow but it might help [place] in the long run.

## 2012-11-08 NOTE — Progress Notes (Signed)
Patient ID: Nathaniel [name], male   DOB: 06/28/1933, 79 y.o.   MRN: [id number]   Needs renal ultrasound to rule out hydronephrosis.

## 2012-11-08 NOTE — Progress Notes (Signed)
Family Medicine Teaching Service Daily Progress Note Service Pager: [phone]  Patient Assessment: Nathaniel [name] is a 79 y.o. year old male presenting with leg pain, initially with Brugada pattern on EKG, and on 5/27 found to have UTI with urinary retention and meeting sepsis criteria.  Subjective: Pt denies being in pain this morning. He says he has not stooled in 5 days. He did allow intermittent in and out catheterizations to be done overnight. Was reportedly incontinent into the bed, then had 600cc out on I&O cath.  Objective: Temp:  [98.3 F (36.8 C)-98.9 F (37.2 C)] 98.7 F (37.1 C) (05/28 0425) Pulse Rate:  [90-107] 103 (05/28 0425) Resp:  [13-19] 19 (05/28 0425) BP: (88-127)/(60-72) 127/72 mmHg (05/28 0425) SpO2:  [95 %-98 %] 95 % (05/28 0425) Exam: General: no acute distress. Room with strong foul odor from urine. Cardiovascular: RRR Respiratory: CTAB, normal respiratory effort Abdomen: soft, nondistended, nontender to palpation Neurological: appears grossly intact  I have reviewed the patient's medications, labs, imaging, and diagnostic testing.  Notable results are summarized below.  Medications:  Scheduled Meds: . aspirin EC  81 mg Oral Daily  . gabapentin  200 mg Oral TID  . heparin  5,000 Units Subcutaneous Q8H  . insulin aspart  0-9 Units Subcutaneous TID WC  . mometasone-formoterol  2 puff Inhalation BID  . montelukast  10 mg Oral QHS  . pantoprazole  40 mg Oral Daily  . phenazopyridine  200 mg Oral TID WC  . piperacillin-tazobactam (ZOSYN)  IV  3.375 g Intravenous Q8H  . simvastatin  20 mg Oral q1800  . sodium chloride  3 mL Intravenous Q12H  . tamsulosin  0.8 mg Oral QPC supper  . tiotropium  18 mcg Inhalation Daily  . vancomycin  500 mg Intravenous Q12H   Continuous Infusions: . sodium chloride 125 mL/hr at 11/08/12 0221   PRN Meds:.acetaminophen, acetaminophen, albuterol, morphine injection, ondansetron (ZOFRAN) IV, ondansetron, traZODone  Urine  output: 1.4 L  Labs:  CBC  Recent Labs Lab 11/06/12 0750 11/07/12 0435 11/08/12 0415  WBC 20.1* 16.4* 11.5*  HGB 12.6* 11.7* 10.9*  HCT 38.1* 35.2* 33.3*  PLT 124* 136* 128*    BMET  Recent Labs Lab 11/06/12 0750 11/07/12 1011 11/08/12 0415  NA 140 136 140  K 3.8 3.2* 3.8  CL 105 103 108  CO2 23 24 23   BUN 21 22 15   CREATININE 0.67 0.88 0.89  GLUCOSE 204* 154* 151*  CALCIUM 8.4 8.0* 7.9*   Lipids:    Component Value Date/Time   CHOL 143 11/06/2012 0750   TRIG 38 11/06/2012 0750   HDL 57 11/06/2012 0750   LDLDIRECT 92 10/02/2008 2103   VLDL 8 11/06/2012 0750   CHOLHDL 2.5 11/06/2012 0750   TSH 0.846  Urinalysis    Component Value Date/Time   COLORURINE YELLOW 11/06/2012 1559   APPEARANCEUR HAZY* 11/06/2012 1559   LABSPEC 1.020 11/06/2012 1559   PHURINE 5.5 11/06/2012 1559   GLUCOSEU 100* 11/06/2012 1559   HGBUR TRACE* 11/06/2012 1559   BILIRUBINUR NEGATIVE 11/06/2012 1559   BILIRUBINUR neg 09/16/2012 1102   KETONESUR NEGATIVE 11/06/2012 1559   PROTEINUR NEGATIVE 11/06/2012 1559   UROBILINOGEN 0.2 11/06/2012 1559   UROBILINOGEN 0.2 09/16/2012 1102   NITRITE POSITIVE* 11/06/2012 1559   NITRITE pos 09/16/2012 1102   LEUKOCYTESUR LARGE* 11/06/2012 1559  Urine micro TNTC WBC, many bacteria  Urine cx multiple bacterial types, non predominant Blood cx NGTD x2  CXR 5/26 no acute process  Assessment/Plan:  Nathaniel [name] is a 79 y.o. year old male presenting with leg pain, initially with Brugada pattern on EKG, now found to have UTI with urinary retention and meeting sepsis criteria.  # UTI with urinary retention, sepsis: - vital signs improved overnight. Afebrile, BP normotensive, still intermittently slightly tachycardic - lactate WNL yesterday, blood cx NGTD - history of emphysematous cystitis requiring urology consult back in August 2013 - needing caths to empty bladder -> refuses foley so we are doing in and out caths - UA/micro suggestive of UTI, urine cx shows no  predominant species - currently on vanc/zosyn. Continue this for another day and monitor for clinical improvement. - urology consulted yesterday and recommend obtaining renal ultrasound, continue I&O cath at discharge. - possible post obstructive diuresis given now incontinence still with retention  # EKG abnormalities: brugada pattern vs syndrome  - all troponins negative - repeat EKG improved (LAD but no brugada pattern) - seen by cardiology on 5/26, cleared for discharge from cardiac standpoint - continue monitor on telemetry  - aspirin daily 81mg    # HTN: - hold home meds now in setting of sepsis with hypotension - may need to add BP meds back on if becomes hypertensive again  # COPD:  - continue home advair, albuterol, spiriva   # Leg pain: exam not suggestive of anything worrisome (no swelling or erythema, calves nontender to palpation) such as DVT, cellulitits, injury. Potassium wnl. Likely from diabetic peripheral neuropathy.  - continue gabapentin for peripheral neuropathy  # Diabetes: hold home metformin  - sensitive SSI while hospitalized, requiring minimal SSI (4 units in last 24 hours) - A1c 7.2 on 09/16/12   # FEN/GI:  - continue NS @ 125 cc/hr - continue home PPI  - carb modified diet  - add miralax today to help with bowel movement  # Prophylaxis:  - SQ heparin   # Dispo:  - transfer back to floor with telemetry today  # Code Status: discussed with patient upon admission, he desires to be full code  Levert Feinstein, MD [facility] Medicine PGY-1 Service Pager [phone]

## 2012-11-08 NOTE — Progress Notes (Signed)
Report called to [facility], receiving RN on  2000. VSS. Transferred to 2022 via wheelchair with personal belongings. Attempted to call daughter with new room number.  [facility]

## 2012-11-09 LAB — COMPREHENSIVE METABOLIC PANEL
ALT: 17 U/L (ref 0–53)
AST: 17 U/L (ref 0–37)
Alkaline Phosphatase: 59 U/L (ref 39–117)
CO2: 23 mEq/L (ref 19–32)
Chloride: 111 mEq/L (ref 96–112)
GFR calc Af Amer: >90 mL/min (ref >90)
GFR calc non Af Amer: 88 mL/min — ABNORMAL LOW (ref >90)
Glucose, Bld: 125 mg/dL — ABNORMAL HIGH (ref 70–99)
Potassium: 3.7 mEq/L (ref 3.5–5.1)
Sodium: 144 mEq/L (ref 135–145)

## 2012-11-09 LAB — CBC
MCH: 28.5 pg (ref 26.0–34.0)
MCHC: 33.1 g/dL (ref 30.0–36.0)
MCV: 86.1 fL (ref 78.0–100.0)
Platelets: 140 10*3/uL — ABNORMAL LOW (ref 150–400)
RBC: 3.82 MIL/uL — ABNORMAL LOW (ref 4.22–5.81)
RDW: 13.9 % (ref 11.5–15.5)

## 2012-11-09 LAB — BASIC METABOLIC PANEL
BUN: 10 mg/dL (ref 6–23)
CO2: 21 mEq/L (ref 19–32)
Chloride: 111 mEq/L (ref 96–112)
Creatinine, Ser: 0.67 mg/dL (ref 0.50–1.35)
Potassium: 3.3 mEq/L — ABNORMAL LOW (ref 3.5–5.1)

## 2012-11-09 LAB — GLUCOSE, CAPILLARY: Glucose-Capillary: 162 mg/dL — ABNORMAL HIGH (ref 70–99)

## 2012-11-09 MED ORDER — FINASTERIDE 5 MG PO TABS
5.0000 mg | ORAL_TABLET | Freq: Every day | ORAL | Status: DC
  Administered 2012-11-10: 5 mg via ORAL
  Filled 2012-11-09 (×2): qty 1

## 2012-11-09 MED ORDER — POTASSIUM CHLORIDE CRYS ER 20 MEQ PO TBCR
40.0000 meq | EXTENDED_RELEASE_TABLET | Freq: Once | ORAL | Status: AC
  Administered 2012-11-09: 40 meq via ORAL
  Filled 2012-11-09: qty 2

## 2012-11-09 MED ORDER — CEPHALEXIN 500 MG PO CAPS
500.0000 mg | ORAL_CAPSULE | Freq: Four times a day (QID) | ORAL | Status: DC
  Administered 2012-11-09 – 2012-11-10 (×5): 500 mg via ORAL
  Filled 2012-11-09 (×8): qty 1

## 2012-11-09 MED ORDER — METRONIDAZOLE 500 MG PO TABS
500.0000 mg | ORAL_TABLET | Freq: Three times a day (TID) | ORAL | Status: DC
  Administered 2012-11-09 – 2012-11-10 (×3): 500 mg via ORAL
  Filled 2012-11-09 (×6): qty 1

## 2012-11-09 NOTE — Progress Notes (Signed)
Bladder scan revealed 665 cc, I&O cath produced 625 cc bright orange clear urine without odor.  A[facility]

## 2012-11-09 NOTE — Care Management Note (Signed)
    Page 1 of 2   11/10/2012     3:49:35 PM   CARE MANAGEMENT NOTE 11/10/2012  Patient:  DAYMEON, FISCHMAN   Account Number:  [id number]  Date Initiated:  11/09/2012  Documentation initiated by:  Aneta Hendershott  Subjective/Objective Assessment:   PT ADM ON 11/06/12 WITH LEG PAIN, EKG CHANGES AND URINARY SEPSIS. PTA, PT LIVES AT HOME WITH FAMILY.     Action/Plan:   WILL FOLLOW FOR HOME NEEDS AS PT PROGRESSES.   Anticipated DC Date:  11/11/2012   Anticipated DC Plan:  HOME W HOME HEALTH SERVICES      DC Planning Services  CM consult      [facility] Choice  HOME HEALTH   Choice offered to / List presented to:  C-1 Patient        HH arranged  HH-1 RN      [facility] agency  [facility]   Status of service:  Completed, signed off Medicare Important Message given?   (If response is "NO", the following Medicare IM given date fields will be blank) Date Medicare IM given:   Date Additional Medicare IM given:    Discharge Disposition:  HOME W HOME HEALTH SERVICES  Per UR Regulation:  Reviewed for med. necessity/level of care/duration of stay  If discussed at Long Length of Stay Meetings, dates discussed:    Comments:  11/10/12 Lular Letson,RN,BSN [phone] PER DAUGHTER, PT IS AGREEABLE TO BEING TAUGHT TO SELF CATH, ONLY BY MALE NURSE.  WILL HAVE MALE NURSE PROVIDE TEACHING PRIOR TO DC, AND FOLLOW UP AT HOME WITH MALE HH NURSE. [facility] CAN PROVIDE HOME MALE NURSE TO DO FOLLOW UP EDUCATION.  CONFIRMED THIS PLAN WITH RESIDENT AND DR HENSEL, AND THEY ARE IN AGREEMENT.  SPOKE WITH DAUGHTER HOLLI, AND SHE ALSO AGREES.  START OF CARE FOR HH 24-48H POST DC DATE.  PT WILL NEED 2 DAY SUPPLY OF CATH SUPPLIES UPON DC, AS HH AGENCY WILL HAVE TO ORDER THESE FOR HOME. WILL ALERT BEDSIDE RN.

## 2012-11-09 NOTE — Progress Notes (Signed)
Subjective: Still unable to void on his own.  Objective: Vital signs in last 24 hours: Temp:  [97.6 F (36.4 C)-97.8 F (36.6 C)] 97.7 F (36.5 C) (05/29 1935) Pulse Rate:  [77-88] 88 (05/29 1935) Resp:  [16-18] 16 (05/29 1935) BP: (135-166)/(84-91) 166/91 mmHg (05/29 1935) SpO2:  [95 %-98 %] 98 % (05/29 1935) Weight:  [53.252 kg (117 lb 6.4 oz)] 53.252 kg (117 lb 6.4 oz) (05/29 0500)  Intake/Output from previous day: 05/28 0701 - 05/29 0700 In: 2883 [P.O.:480; I.V.:2128; IV Piggyback:275] Out: 3750 [Urine:3750] Intake/Output this shift:      Lab Results: Renal ultrasound shows no hydronephrosis, thickened bladder wall probably secondary to outlet obstruction.  Recent Labs  11/07/12 0435 11/08/12 0415 11/09/12 0806  HGB 11.7* 10.9* 10.9*  HCT 35.2* 33.3* 32.9*   BMET  Recent Labs  11/09/12 0430 11/09/12 0806  NA 144 142  K 3.7 3.3*  CL 111 111  CO2 23 21  GLUCOSE 125* 123*  BUN 11 10  CREATININE 0.72 0.67  CALCIUM 8.1* 7.8*   No results found for this basename: LABPT, INR,  in the last 72 hours No results found for this basename: LABURIN,  in the last 72 hours Results for orders placed during the hospital encounter of 11/06/12  URINE CULTURE     Status: None   Collection Time    11/06/12  3:59 PM      Result Value Range Status   Specimen Description URINE, CATHETERIZED   Final   Special Requests NONE   Final   Culture  Setup Time 11/06/2012 17:05   Final   Colony Count >=100,000 COLONIES/ML   Final   Culture     Final   Value: Multiple bacterial morphotypes present, none predominant. Suggest appropriate recollection if clinically indicated.   Report Status 11/08/2012 FINAL   Final  MRSA PCR SCREENING     Status: None   Collection Time    11/07/12 12:48 PM      Result Value Range Status   MRSA by PCR NEGATIVE  NEGATIVE Final   Comment:            The GeneXpert MRSA Assay (FDA     approved for NASAL specimens     only), is one component of a     comprehensive MRSA colonization     surveillance program. It is not     intended to diagnose MRSA     infection nor to guide or     monitor treatment for     MRSA infections.  CULTURE, BLOOD (ROUTINE X 2)     Status: None   Collection Time    11/07/12  3:30 PM      Result Value Range Status   Specimen Description BLOOD RIGHT HAND   Final   Special Requests BOTTLES DRAWN AEROBIC ONLY 10CC   Final   Culture  Setup Time 11/07/2012 20:36   Final   Culture     Final   Value:        BLOOD CULTURE RECEIVED NO GROWTH TO DATE CULTURE WILL BE HELD FOR 5 DAYS BEFORE ISSUING A FINAL NEGATIVE REPORT   Report Status PENDING   Incomplete  CULTURE, BLOOD (ROUTINE X 2)     Status: None   Collection Time    11/07/12  3:33 PM      Result Value Range Status   Specimen Description BLOOD RIGHT FOREARM   Final   Special Requests BOTTLES DRAWN AEROBIC ONLY 10CC  Final   Culture  Setup Time 11/07/2012 20:36   Final   Culture     Final   Value:        BLOOD CULTURE RECEIVED NO GROWTH TO DATE CULTURE WILL BE HELD FOR 5 DAYS BEFORE ISSUING A FINAL NEGATIVE REPORT   Report Status PENDING   Incomplete    Studies/Results: US Renal  11/08/2012   *RADIOLOGY REPORT*  Clinical Data:  Urinary retention.  RENAL/URINARY TRACT ULTRASOUND COMPLETE  Comparison:  CT from 02/06/2012  Findings:  Right Kidney:  Measures 11.2 cm.  Normal in size and parenchymal echogenicity.  No evidence of mass or hydronephrosis. There is a cyst within the upper pole measuring 1.2 x 0.9 x 1.1 cm.  Left Kidney:  Measures 11.5 cm.  Normal in size and parenchymal echogenicity.  No evidence of mass or hydronephrosis.  Bladder:  Diffuse wall thickening which measure 9 mm.  IMPRESSION:  1.  No hydronephrosis. 2.  Wall thickening of the urinary bladder. 3.  Right renal cyst.   Original Report Authenticated By: Signa Kell, M.D.    Assessment/Plan:  Urinary retention  Continue tamsulosin.  Continue in and out catheterizations.  He will  need cystoscopy as an outpatient if he is agreeable.  If he is unable to void he will need TURP if cystoscopy reveals BPH as cause of urinary retention.   LOS: 3 days   Nathaniel [name] 11/09/2012, 9:05 PM

## 2012-11-09 NOTE — Progress Notes (Signed)
Family Practice Teaching Service Interval Progress Note  Went by pt's room to speak with him using interpreter phone to discuss need for continuing in and out catheterizations at home after discharge. Patient states he will not be able to do in and out caths himself at home, even if he receives instruction in how to do it prior to discharge. Pt states he lives alone in an apartment and has no one else to help him there, and struggles with the language barrier. He strongly feels that he will not be able to do the caths at home by himself. Encouraged that pt likely could do the caths on his own if we teach him here, but he refuses. Explained to patient that the risks of not continuing catheterizations at home include severe infection and possibly death. He understands these risks. He is willing to continue to have them done while he is here in the hospital.  Patient also stated that he would not be interested in surgery to help him drain his bladder. He attributes his bladder problems to drinking coffee and to the medicines he has gotten in the hospital.  Will discuss with team in the morning the possibility of setting up home health RN to help patient with bladder catheterizations. Given that he has become septic from his urinary retention during this hospitalization, I suspect that he is at high risk for rehospitalization if he is not able to drain his bladder after discharge.  Levert Feinstein, MD Family Medicine PGY-1 Service Pager [phone]

## 2012-11-09 NOTE — Progress Notes (Signed)
PATIENT INCONT. LARGE AMT. ORANGE FOUL SMELLING URINE. DID BLADDER SCAN AND GOT 875 ML. IN AND OUT CATH. DONE. STOOD PATIENT UP TO VOID AND COULD NOT AND IN AND OUT CATH PATIENT AND GOT 800 NL.

## 2012-11-09 NOTE — Progress Notes (Signed)
Family Medicine Teaching Service Daily Progress Note Service Pager: [phone]  Patient Assessment: Nickalus Thornsberry is a 79 y.o. year old male presenting with leg pain, initially with Brugada pattern on EKG, and on 5/27 found to have UTI with urinary retention and meeting sepsis criteria.  Subjective: Denies pain. Still hasn't had bowel movement. Did have to be cathed overnight with 800cc out on one cath, after already having been incontinent of large amount of urine.  Objective: Temp:  [97.6 F (36.4 C)-98.1 F (36.7 C)] 97.6 F (36.4 C) (05/29 0335) Pulse Rate:  [77-96] 77 (05/29 0335) Resp:  [18-25] 18 (05/29 0335) BP: (127-155)/(61-95) 135/84 mmHg (05/29 0335) SpO2:  [96 %-98 %] 98 % (05/29 0335) Weight:  [117 lb 6.4 oz (53.252 kg)] 117 lb 6.4 oz (53.252 kg) (05/29 0500) Exam: General: no acute distress. Pleasant affect Cardiovascular: RRR Respiratory: CTAB, normal respiratory effort Abdomen: soft, nondistended, nontender to palpation Neurological: appears grossly intact Ext: no swelling in legs, calves nontender to palpation  I have reviewed the patient's medications, labs, imaging, and diagnostic testing.  Notable results are summarized below.  Medications:  Scheduled Meds: . aspirin EC  81 mg Oral Daily  . gabapentin  200 mg Oral TID  . heparin  5,000 Units Subcutaneous Q8H  . insulin aspart  0-9 Units Subcutaneous TID WC  . mometasone-formoterol  2 puff Inhalation BID  . montelukast  10 mg Oral QHS  . pantoprazole  40 mg Oral Daily  . phenazopyridine  200 mg Oral TID WC  . piperacillin-tazobactam (ZOSYN)  IV  3.375 g Intravenous Q8H  . polyethylene glycol  17 g Oral Daily  . simvastatin  20 mg Oral q1800  . sodium chloride  3 mL Intravenous Q12H  . tamsulosin  0.8 mg Oral QPC supper  . tiotropium  18 mcg Inhalation Daily  . vancomycin  500 mg Intravenous Q12H   Continuous Infusions: . sodium chloride 125 mL/hr at 11/09/12 0433   PRN Meds:.acetaminophen,  acetaminophen, albuterol, ondansetron (ZOFRAN) IV, ondansetron, traZODone  Urine output: 3.8L  Labs:  CBC  Recent Labs Lab 11/07/12 0435 11/08/12 0415 11/09/12 0806  WBC 16.4* 11.5* 6.0  HGB 11.7* 10.9* 10.9*  HCT 35.2* 33.3* 32.9*  PLT 136* 128* 140*     CMET  Recent Labs Lab 11/06/12 0139  11/08/12 0415 11/09/12 0430 11/09/12 0806  NA 138  < > 140 144 142  K 3.7  < > 3.8 3.7 3.3*  CL 101  < > 108 111 111  CO2 27  < > 23 23 21   BUN 23  < > 15 11 10   CREATININE 0.72  < > 0.89 0.72 0.67  GLUCOSE 160*  < > 151* 125* 123*  CALCIUM 9.3  < > 7.9* 8.1* 7.8*  AST 19  --   --  17  --   ALT 22  --   --  17  --   ALKPHOS 72  --   --  59  --   PROT 7.6  --   --  6.5  --   ALBUMIN 4.0  --   --  2.9*  --   < > = values in this interval not displayed.     Lipids:    Component Value Date/Time   CHOL 143 11/06/2012 0750   TRIG 38 11/06/2012 0750   HDL 57 11/06/2012 0750   LDLDIRECT 92 10/02/2008 2103   VLDL 8 11/06/2012 0750   CHOLHDL 2.5 11/06/2012 0750   TSH  0.846  Urinalysis    Component Value Date/Time   COLORURINE YELLOW 11/06/2012 1559   APPEARANCEUR HAZY* 11/06/2012 1559   LABSPEC 1.020 11/06/2012 1559   PHURINE 5.5 11/06/2012 1559   GLUCOSEU 100* 11/06/2012 1559   HGBUR TRACE* 11/06/2012 1559   BILIRUBINUR NEGATIVE 11/06/2012 1559   BILIRUBINUR neg 09/16/2012 1102   KETONESUR NEGATIVE 11/06/2012 1559   PROTEINUR NEGATIVE 11/06/2012 1559   UROBILINOGEN 0.2 11/06/2012 1559   UROBILINOGEN 0.2 09/16/2012 1102   NITRITE POSITIVE* 11/06/2012 1559   NITRITE pos 09/16/2012 1102   LEUKOCYTESUR LARGE* 11/06/2012 1559  Urine micro TNTC WBC, many bacteria  Urine cx multiple bacterial types, non predominant Blood cx NGTD x2  CXR 5/26 no acute process  Assessment/Plan:  Jencarlos Nicolson is a 79 y.o. year old male presenting with leg pain, initially with Brugada pattern on EKG, now found to have UTI with urinary retention and meeting sepsis criteria.  # UTI with urinary retention,  sepsis: - vital signs continue to be stable & blood cx NGTD - history of emphysematous cystitis requiring urology consult back in August 2013 - needing caths to empty bladder -> refuses foley so we are doing in and out caths - UA/micro suggestive of UTI, urine cx shows no predominant species - currently on vanc/zosyn. Will switch today to PO abx - discuss choice @ rounds - urology consulted and recommend obtaining renal ultrasound, continue I&O cath at discharge. - renal u/s showed thickening of bladder but no hydronephrosis - possible post obstructive diuresis given now incontinence still with retention (3.8L out yesterday)  # EKG abnormalities: brugada pattern vs syndrome  - all troponins negative - repeat EKG improved (LAD but no brugada pattern) - seen by cardiology on 5/26, cleared for discharge from cardiac standpoint - continue monitor on telemetry  - aspirin daily 81mg    # Hypocalcemia: - ionized calcium just slightly low today at 1.12  # Thrombocytopenia: - platelets stable at 140  # HTN: - currently holding home meds, had one elevated BP overnight may need to add BP meds back on if becomes hypertensive again, continue to monitor  # COPD:  - continue home advair, albuterol, spiriva   # Leg pain: exam not suggestive of anything worrisome (no swelling or erythema, calves nontender to palpation) such as DVT, cellulitits, injury. Potassium wnl. Likely from diabetic peripheral neuropathy.  - continue gabapentin for peripheral neuropathy  # Diabetes: hold home metformin  - sensitive SSI while hospitalized, requiring minimal SSI (4 units in last 24 hours) - A1c 7.2 on 09/16/12   # FEN/GI:  - continue NS @ 125 cc/hr - hypokalemic today, will replete with x1 - continue home PPI  - carb modified diet  - increase miralax to BID to help with bowel movement  # Prophylaxis:  - SQ heparin   # Dispo:  - will observe for 24 h with PO abx, likely d/c home tomorrow  # Code  Status: discussed with patient upon admission, he desires to be full code  Levert Feinstein, MD [facility] Medicine PGY-1 Service Pager [phone]

## 2012-11-09 NOTE — Progress Notes (Signed)
Seen and examined.  Discussed with Dr. Pollie Meyer.  We will start finasteride which admittedly will take months to help.  Urine culture has multiple species.  He had cath specimen with obviously purulent urine.  I suspect the multiple species are real and had a polymicrobial UTI.  As such, we will need to take a leap of faith and select an oral antibiotic.  Today, we will make that leap to Keflex.

## 2012-11-10 DIAGNOSIS — E119 Type 2 diabetes mellitus without complications: Secondary | ICD-10-CM

## 2012-11-10 LAB — CBC
MCV: 86.6 fL (ref 78.0–100.0)
Platelets: 159 10*3/uL (ref 150–400)
RDW: 13.9 % (ref 11.5–15.5)
WBC: 5 10*3/uL (ref 4.0–10.5)

## 2012-11-10 LAB — BASIC METABOLIC PANEL
CO2: 24 mEq/L (ref 19–32)
Calcium: 8.2 mg/dL — ABNORMAL LOW (ref 8.4–10.5)
Creatinine, Ser: 0.69 mg/dL (ref 0.50–1.35)
GFR calc non Af Amer: 89 mL/min — ABNORMAL LOW (ref >90)

## 2012-11-10 LAB — GLUCOSE, CAPILLARY: Glucose-Capillary: 126 mg/dL — ABNORMAL HIGH (ref 70–99)

## 2012-11-10 MED ORDER — HYDROCHLOROTHIAZIDE 12.5 MG PO CAPS
12.5000 mg | ORAL_CAPSULE | Freq: Every day | ORAL | Status: DC
Start: 2012-11-10 — End: 2012-11-10
  Administered 2012-11-10: 12.5 mg via ORAL
  Filled 2012-11-10: qty 1

## 2012-11-10 MED ORDER — FINASTERIDE 5 MG PO TABS: 5.0000 mg | ORAL_TABLET | Freq: Every day | ORAL | Status: DC

## 2012-11-10 MED ORDER — LISINOPRIL-HYDROCHLOROTHIAZIDE 10-12.5 MG PO TABS: 1.0000 | ORAL_TABLET | Freq: Every day | ORAL | Status: DC

## 2012-11-10 MED ORDER — METRONIDAZOLE 500 MG PO TABS: 500.0000 mg | ORAL_TABLET | Freq: Three times a day (TID) | ORAL | Status: DC

## 2012-11-10 MED ORDER — LISINOPRIL 10 MG PO TABS
10.0000 mg | ORAL_TABLET | Freq: Every day | ORAL | Status: DC
  Administered 2012-11-10: 10 mg via ORAL
  Filled 2012-11-10: qty 1

## 2012-11-10 MED ORDER — POLYETHYLENE GLYCOL 3350 17 G PO PACK: 17.0000 g | PACK | Freq: Every day | ORAL | Status: DC | PRN

## 2012-11-10 MED ORDER — CEPHALEXIN 500 MG PO CAPS: 500.0000 mg | ORAL_CAPSULE | Freq: Four times a day (QID) | ORAL | Status: DC

## 2012-11-10 MED ORDER — TAMSULOSIN HCL 0.4 MG PO CAPS: 0.8000 mg | ORAL_CAPSULE | Freq: Every day | ORAL | Status: DC

## 2012-11-10 NOTE — Progress Notes (Signed)
Thank you to Jerrell Belfast RNCM for this referral.  Reviewed Dr Cyndia Skeeters note regarding patient declination of self cath care.  Obtained daughter's number from patient.  He speaks limited english but was able to ask me to call his daughter to talk.  Called daughter regarding self cath concern.  She indicated that her father will learn how to self cath but due to his culture he does not want a woman to cath him or teach him.  Conveyed this information to Georgiann Hahn RNCM.  She collaborated with the inpatient team to initiate orders for [facility] (male) that will teach him and follow up at home.  [facility] will provide a transition of care call to the daughter on next week to assess the stability of this plan and further needs. Of note, [facility] Care Management services does not replace or interfere with any services that are arranged by inpatient case management or social work.  For additional questions or referrals please contact Anibal Henderson BSN RN [facility] Liaison at [phone].

## 2012-11-10 NOTE — Progress Notes (Signed)
Seen and examined.  Ready for DC.  We have a major and minor problem that we are doing our best to cope with.   The minor problem is no single organism on cath urine culture.  Likely polymicrobial infection.  We are treating with Keflex and Flagyl and clinically patient is improving.  The major problem is chronic urinary retention.  He refuses foley, urologic surgery and states he will not do home I&O caths.  I worry that it is only a matter of time until he comes back with infection and/or worsening obstruction.  Hopefully, the finasteride will make a difference.  I see no other options than to DC and hope for the best.

## 2012-11-10 NOTE — Progress Notes (Signed)
Interpreter present for discharge instructions. Pt/family given discharge instructions, medication lists, follow up appointments, and when to call the doctor.  Pt/family verbalizes understanding via interpreter.  Interpreter used to educate pt on self catherization. Called daughter and explained importance of follow up with urologist and self catherization.  I gave phone number to daughter to call if any questions after discharge.  Pt stated throughout discharge that he was taking too many medications. Sent patient home with 12 In and Out cath kits for self cath at home. Per case management he would not have anyone to come to his home until Sunday or Monday. Pt is aware of importance of emptying bladder and keeping area clean during procedure. Pt friend and daughter seemed to think he would not do this unless made to at home.  I explained that he needed to be encouraged to follow instructions. Thomas Hoff

## 2012-11-10 NOTE — Discharge Summary (Signed)
Family Medicine Teaching [facility] Discharge Summary  Patient name: Nathaniel [name] Medical record number: [id number] Date of birth: [birth year] Age: 79 y.o. Gender: male Date of Admission: 11/06/2012  Date of Discharge: 11/10/12 Admitting Physician: Leighton Roach McDiarmid, MD  Primary Care Provider: No primary provider on file.  Indication for Hospitalization: EKG changes  Discharge Diagnoses:  1. EKG changes suggestive of brugada pattern 2. Sepsis secondary to UTI 3. UTI likely secondary to urinary retention 4. BPH 5. Hypocalcemia 6. Thrombocytopenia 7. Hypertension 8. COPD 9. Diabetic neuropathy 10. Diabetes mellitus  Brief Hospital Course:  Nathaniel [name] is a 79 y.o. male who presented to the ER after calling EMS with a chief complaint of leg pain. EMS performed an EKG which showed EKG abnormalities consistent with Brugada pattern. Pt was admitted to the hospital and after admission was found to be in urinary retention with a UTI, subsequently developing sepsis.  # EKG changes: found on EKG done by EMS. Code STEMI was called but cancelled after patient arrived. EKG showed signs of brugada pattern, but this was not found on repeat EKG the morning after admission. Cardiology evaluated patient in the hospital and did not feel that further workup or intervention was necessary. Pt was given aspirin 81mg  daily and monitored on telemetry during his stay here.  # Urinary tract infection with sepsis: This was thought to be secondary to severe urinary retention, most likely due to chronic BPH. Pt had a markedly elevated WBC count (>20) and had a catheterized urine which looked greatly like a UTI. He was started on empiric keflex. His urine culture however grew multiple bacterial morphotypes, without a predominant species but appeared most consistent with GI-type flora per discussion with the micro lab. On 5/27 pt was noted to meet severe sepsis criteria despite being otherwise clinically well-appearing, with  a leukocytosis, tachycardia, and hypotension. He was then started on vanc/zosyn and transferred to the stepdown unit. He continued to have urinary retention, repeatedly having >600 cc on his bladder scans. He refused having a foley catheter placed but did consent to intermittent in and out catheterizations. Urology was consulted and recommended continuing in and out caths, and obtaining a renal ultrasound, which showed thickened bladder but no hydronephrosis. He clinically improved and was transitioned to PO keflex and flagyl to cover his UTI. Prior to discharge patient was instructed on how to cath himself at home. Home health was setup and will visit him at home to assist with catheterizations. He has follow up established with urology and will likely need a cystoscopy as an outpatient. Of note, patient demonstrated some hesitancy to cath himself at home but it was difficult to explore his reluctance with him as he spoke no [place] and there was not a Nurse, learning disability available for his specific dialect of [place]  # BPH: Patient's home flomax dose was increased to 0.8mg  daily, and he was started on finasteride to assist with urinary retention. Per urology, he may eventually require a TURP procedure. He will f/u with urology as an outpatient.  # Hypertension: BP meds were held once pt was noted to be septic, however his blood pressure gradually rose and his home BP meds were restarted on the day of discharge.  # COPD: continued pt's home advair, albuterol, and spiriva  # Leg pain: likely secondary to diabetic neuropathy. We increased the dose of his gabapentin and he reported improvement in his pain.  # Diabetes: held home metformin and gave sliding scale insulin while hospitalized. Continued metformin at  discharge.  # Constipation: pt was given miralax for constipation, which resulted in a bowel movement during the day before discharge.  # Hypocalcemia: this was noted to be very slight with an ionized  calcium of 1.12. Did not pursue further workup as an inpatient.  Consultations: urology  Procedures: none  Significant Labs and Imaging:   CBC  Lab  11/08/12 0415  11/09/12 0806  11/10/12 0506   WBC  11.5*  6.0  5.0   HGB  10.9*  10.9*  11.1*   HCT  33.3*  32.9*  34.3*   PLT  128*  140*  159    CMET  Lab  11/06/12 0139   11/09/12 0430  11/09/12 0806  11/10/12 0506   NA  138  < >  144  142  142   K  3.7  < >  3.7  3.3*  3.6   CL  101  < >  111  111  109   CO2  27  < >  23  21  24    BUN  23  < >  11  10  9    CREATININE  0.72  < >  0.72  0.67  0.69   GLUCOSE  160*  < >  125*  123*  110*   CALCIUM  9.3  < >  8.1*  7.8*  8.2*   AST  19  --  17  --  --   ALT  22  --  17  --  --   ALKPHOS  72  --  59  --  --   PROT  7.6  --  6.5  --  --   ALBUMIN  4.0  --  2.9*  --  --   < > = values in this interval not displayed.  Urinalysis    Component Value Date/Time   COLORURINE YELLOW 11/06/2012 1559   APPEARANCEUR HAZY* 11/06/2012 1559   LABSPEC 1.020 11/06/2012 1559   PHURINE 5.5 11/06/2012 1559   GLUCOSEU 100* 11/06/2012 1559   HGBUR TRACE* 11/06/2012 1559   BILIRUBINUR NEGATIVE 11/06/2012 1559   BILIRUBINUR neg 09/16/2012 1102   KETONESUR NEGATIVE 11/06/2012 1559   PROTEINUR NEGATIVE 11/06/2012 1559   UROBILINOGEN 0.2 11/06/2012 1559   UROBILINOGEN 0.2 09/16/2012 1102   NITRITE POSITIVE* 11/06/2012 1559   NITRITE pos 09/16/2012 1102   LEUKOCYTESUR LARGE* 11/06/2012 1559    Lipids:    Component  Value  Date/Time    CHOL  143  11/06/2012 0750    TRIG  38  11/06/2012 0750    HDL  57  11/06/2012 0750    LDLDIRECT  92  10/02/2008 2103    VLDL  8  11/06/2012 0750    CHOLHDL  2.5  11/06/2012 0750    TSH 0.846 Urine micro TNTC WBC, many bacteria  Urine cx multiple bacterial types, non predominant  Blood cx NGTD x2   CXR 5/26 no acute process  Renal Ultrasound 5/28: 1. No hydronephrosis.  2. Wall thickening of the urinary bladder.  3. Right renal cyst measuring 1.12 x 0.9 x 1.1cm in  upper pole.    Medication List    STOP taking these medications       sulfamethoxazole-trimethoprim 800-160 MG per tablet  Commonly known as:  BACTRIM DS,SEPTRA DS      TAKE these medications       cephALEXin 500 MG capsule  Commonly known as:  KEFLEX  Take 1 capsule (500 mg  total) by mouth every 6 (six) hours.     finasteride 5 MG tablet  Commonly known as:  PROSCAR  Take 1 tablet (5 mg total) by mouth daily.     Fluticasone-Salmeterol 500-50 MCG/DOSE Aepb  Commonly known as:  ADVAIR  Inhale 1 puff into the lungs 2 (two) times daily.     gabapentin 100 MG capsule  Commonly known as:  NEURONTIN  Take 2 capsules (200 mg total) by mouth 3 (three) times daily.     lisinopril-hydrochlorothiazide 10-12.5 MG per tablet  Commonly known as:  PRINZIDE,ZESTORETIC  Take 1 tablet by mouth daily.     metFORMIN 500 MG tablet  Commonly known as:  GLUCOPHAGE  Take 1 tablet (500 mg total) by mouth 2 (two) times daily with a meal. 1 tablet by mouth twice daily for diabetes     metroNIDAZOLE 500 MG tablet  Commonly known as:  FLAGYL  Take 1 tablet (500 mg total) by mouth every 8 (eight) hours.     montelukast 10 MG tablet  Commonly known as:  SINGULAIR  take 1 tablet by mouth once daily     omeprazole 20 MG capsule  Commonly known as:  PRILOSEC  Take 1 capsule (20 mg total) by mouth daily.     polyethylene glycol packet  Commonly known as:  MIRALAX / GLYCOLAX  Take 17 g by mouth daily as needed (constipation).     pravastatin 40 MG tablet  Commonly known as:  PRAVACHOL  Take 1 tablet (40 mg total) by mouth daily.     tamsulosin 0.4 MG Caps  Commonly known as:  FLOMAX  Take 2 capsules (0.8 mg total) by mouth daily after supper.     tiotropium 18 MCG inhalation capsule  Commonly known as:  SPIRIVA  Place 1 capsule (18 mcg total) into inhaler and inhale daily. 1 cap inhaled daily     VENTOLIN HFA 108 (90 BASE) MCG/ACT inhaler  Generic drug:  albuterol  Inhale 1-2 puffs  every 4 hours as needed for breathing difficulties        Issues for Follow Up:  - f/u urinary retention - has outpatient urology appointment scheduled (see appt below). F/u whether pt needs to continue self caths for urinary retention - ensure continued improvement of UTI  Outstanding Results: blood cx (NGTD at time of d/c)      Follow-up Information   Follow up with NESI,MARC-HENRY, MD On 11/15/2012. (at 11:15am)    Contact information:   [address] [facility] [place] [address] [phone]      Schedule an appointment as soon as possible for a visit with [facility]. (within one week)    Contact information:   [address] [place] [address] [phone]      Discharge Condition: stable  Discharge Instructions: Please refer to Patient Instructions section of EMR for full details.  Patient was counseled important signs and symptoms that should prompt return to medical care, changes in medications, dietary instructions, activity restrictions, and follow up appointments.    Levert Feinstein, MD Family Medicine PGY-1

## 2012-11-10 NOTE — Progress Notes (Signed)
Family Medicine Teaching Service Daily Progress Note Service Pager: [phone]  Patient Assessment: Nathaniel [name] is a 79 y.o. year old male presenting with leg pain, initially with Brugada pattern on EKG, and on 5/27 found to have UTI with urinary retention and meeting sepsis criteria.  Subjective: Had a bowel movement yesterday. Wants to go home today.  Objective: Temp:  [97.7 F (36.5 C)-97.8 F (36.6 C)] 97.8 F (36.6 C) (05/30 0424) Pulse Rate:  [77-88] 77 (05/30 0424) Resp:  [16] 16 (05/30 0424) BP: (148-166)/(86-91) 148/91 mmHg (05/30 0424) SpO2:  [95 %-98 %] 98 % (05/30 0424) Weight:  [115 lb (52.164 kg)] 115 lb (52.164 kg) (05/30 0424) Exam: General: no acute distress. Cardiovascular: RRR Respiratory: CTAB, normal respiratory effort Abdomen: soft, nondistended, nontender to palpation Neurological: appears grossly intact Ext: no swelling in legs, calves nontender to palpation  I have reviewed the patient's medications, labs, imaging, and diagnostic testing.  Notable results are summarized below.  Medications:  Scheduled Meds: . aspirin EC  81 mg Oral Daily  . cephALEXin  500 mg Oral Q6H  . finasteride  5 mg Oral Daily  . gabapentin  200 mg Oral TID  . heparin  5,000 Units Subcutaneous Q8H  . insulin aspart  0-9 Units Subcutaneous TID WC  . metroNIDAZOLE  500 mg Oral Q8H  . mometasone-formoterol  2 puff Inhalation BID  . montelukast  10 mg Oral QHS  . pantoprazole  40 mg Oral Daily  . polyethylene glycol  17 g Oral Daily  . simvastatin  20 mg Oral q1800  . sodium chloride  3 mL Intravenous Q12H  . tamsulosin  0.8 mg Oral QPC supper  . tiotropium  18 mcg Inhalation Daily   Continuous Infusions:   PRN Meds:.acetaminophen, acetaminophen, albuterol, ondansetron (ZOFRAN) IV, ondansetron, traZODone  Urine output: 3 L  Labs:  CBC  Recent Labs Lab 11/08/12 0415 11/09/12 0806 11/10/12 0506  WBC 11.5* 6.0 5.0  HGB 10.9* 10.9* 11.1*  HCT 33.3* 32.9* 34.3*  PLT  128* 140* 159     CMET  Recent Labs Lab 11/06/12 0139  11/09/12 0430 11/09/12 0806 11/10/12 0506  NA 138  < > 144 142 142  K 3.7  < > 3.7 3.3* 3.6  CL 101  < > 111 111 109  CO2 27  < > 23 21 24   BUN 23  < > 11 10 9   CREATININE 0.72  < > 0.72 0.67 0.69  GLUCOSE 160*  < > 125* 123* 110*  CALCIUM 9.3  < > 8.1* 7.8* 8.2*  AST 19  --  17  --   --   ALT 22  --  17  --   --   ALKPHOS 72  --  59  --   --   PROT 7.6  --  6.5  --   --   ALBUMIN 4.0  --  2.9*  --   --   < > = values in this interval not displayed.     Lipids:    Component Value Date/Time   CHOL 143 11/06/2012 0750   TRIG 38 11/06/2012 0750   HDL 57 11/06/2012 0750   LDLDIRECT 92 10/02/2008 2103   VLDL 8 11/06/2012 0750   CHOLHDL 2.5 11/06/2012 0750   TSH 0.846  Urinalysis    Component Value Date/Time   COLORURINE YELLOW 11/06/2012 1559   APPEARANCEUR HAZY* 11/06/2012 1559   LABSPEC 1.020 11/06/2012 1559   PHURINE 5.5 11/06/2012 1559   GLUCOSEU  100* 11/06/2012 1559   HGBUR TRACE* 11/06/2012 1559   BILIRUBINUR NEGATIVE 11/06/2012 1559   BILIRUBINUR neg 09/16/2012 1102   KETONESUR NEGATIVE 11/06/2012 1559   PROTEINUR NEGATIVE 11/06/2012 1559   UROBILINOGEN 0.2 11/06/2012 1559   UROBILINOGEN 0.2 09/16/2012 1102   NITRITE POSITIVE* 11/06/2012 1559   NITRITE pos 09/16/2012 1102   LEUKOCYTESUR LARGE* 11/06/2012 1559  Urine micro TNTC WBC, many bacteria  Urine cx multiple bacterial types, non predominant Blood cx NGTD x2  CXR 5/26 no acute process  Assessment/Plan:  Nathaniel [name] is a 79 y.o. year old male presenting with leg pain, initially with Brugada pattern on EKG, now found to have UTI with urinary retention and meeting sepsis criteria.  # UTI with urinary retention, sepsis: - vital signs continue to be stable & blood cx NGTD - sepsis resolved - history of emphysematous cystitis requiring urology consult back in August 2013 - needing caths to empty bladder -> refuses foley so we are doing in and out caths - UA/micro  suggestive of UTI, urine cx shows no predominant species - currently on keflex and metronidazole, as yesterday we called micro bench and ucx suggestive of GI pathogens - urology consulted and recommended obtaining renal ultrasound, continue I&O cath at discharge. - renal u/s showed thickening of bladder but no hydronephrosis - possible post obstructive diuresis given now incontinence still with retention - pt refusing to cath himself at home. Will consult case management as [facility] CM might have insights on how to best proceed. - continue finasteride and tamulosin - per urology, needs outpatient cystoscopy if agreeable. Will talk with patient about it today prior to discharge  # EKG abnormalities: brugada pattern vs syndrome  - all troponins negative - repeat EKG improved (LAD but no brugada pattern) - seen by cardiology on 5/26, cleared for discharge from cardiac standpoint - continue monitor on telemetry  - aspirin daily 81mg    # Hypocalcemia: - ionized calcium just slightly low at 1.12, no further workup needed  # Thrombocytopenia: - platelets stable   # HTN: - BP elevated, will restart home BP meds today  # COPD:  - continue home advair, albuterol, spiriva   # Leg pain: exam not suggestive of anything worrisome (no swelling or erythema, calves nontender to palpation) such as DVT, cellulitits, injury. Potassium wnl. Likely from diabetic peripheral neuropathy.  - continue gabapentin for peripheral neuropathy  # Diabetes: hold home metformin  - sensitive SSI while hospitalized, requiring minimal SSI (4 units in last 24 hours) - A1c 7.2 on 09/16/12   # FEN/GI:  - continue NS @ 125 cc/hr - continue home PPI  - carb modified diet  - miralax for BM  # Prophylaxis:  - SQ heparin   # Dispo:  - d/c home today after case management consult  # Code Status: discussed with patient upon admission, he desires to be full code  Levert Feinstein, MD [facility] Medicine PGY-1 Service Pager  [phone]

## 2012-11-13 LAB — CULTURE, BLOOD (ROUTINE X 2)
Culture: NO GROWTH
Culture: NO GROWTH

## 2012-11-14 NOTE — Discharge Summary (Signed)
Reviewed and agree with DC and with Dr. McIntyre's documentation and management. 

## 2013-01-30 ENCOUNTER — Emergency Department (HOSPITAL_COMMUNITY): Payer: PRIVATE HEALTH INSURANCE

## 2013-01-30 ENCOUNTER — Encounter (HOSPITAL_COMMUNITY): Payer: Self-pay | Admitting: *Deleted

## 2013-01-30 ENCOUNTER — Ambulatory Visit (INDEPENDENT_AMBULATORY_CARE_PROVIDER_SITE_OTHER): Payer: Medicare Other | Admitting: Family Medicine

## 2013-01-30 ENCOUNTER — Inpatient Hospital Stay (HOSPITAL_COMMUNITY)
Admission: EM | Admit: 2013-01-30 | Discharge: 2013-02-01 | DRG: 191 | Disposition: A | Discharge status: Home / Self Care | Payer: PRIVATE HEALTH INSURANCE | Attending: Family Medicine | Admitting: Family Medicine

## 2013-01-30 VITALS — BP 150/100 | HR 99 | Temp 97.9°F | Resp 26

## 2013-01-30 DIAGNOSIS — IMO0002 Reserved for concepts with insufficient information to code with codable children: Secondary | ICD-10-CM

## 2013-01-30 DIAGNOSIS — E874 Mixed disorder of acid-base balance: Secondary | ICD-10-CM | POA: Diagnosis present

## 2013-01-30 DIAGNOSIS — R000 Tachycardia, unspecified: Secondary | ICD-10-CM | POA: Diagnosis present

## 2013-01-30 DIAGNOSIS — N3080 Other cystitis without hematuria: Secondary | ICD-10-CM | POA: Diagnosis present

## 2013-01-30 DIAGNOSIS — N390 Urinary tract infection, site not specified: Secondary | ICD-10-CM

## 2013-01-30 DIAGNOSIS — Z87891 Personal history of nicotine dependence: Secondary | ICD-10-CM

## 2013-01-30 DIAGNOSIS — Z23 Encounter for immunization: Secondary | ICD-10-CM

## 2013-01-30 DIAGNOSIS — N400 Enlarged prostate without lower urinary tract symptoms: Secondary | ICD-10-CM | POA: Diagnosis present

## 2013-01-30 DIAGNOSIS — G4700 Insomnia, unspecified: Secondary | ICD-10-CM | POA: Diagnosis present

## 2013-01-30 DIAGNOSIS — K219 Gastro-esophageal reflux disease without esophagitis: Secondary | ICD-10-CM | POA: Diagnosis present

## 2013-01-30 DIAGNOSIS — Z79899 Other long term (current) drug therapy: Secondary | ICD-10-CM

## 2013-01-30 DIAGNOSIS — B952 Enterococcus as the cause of diseases classified elsewhere: Secondary | ICD-10-CM | POA: Diagnosis present

## 2013-01-30 DIAGNOSIS — E785 Hyperlipidemia, unspecified: Secondary | ICD-10-CM | POA: Diagnosis present

## 2013-01-30 DIAGNOSIS — J441 Chronic obstructive pulmonary disease with (acute) exacerbation: Principal | ICD-10-CM | POA: Diagnosis present

## 2013-01-30 DIAGNOSIS — I10 Essential (primary) hypertension: Secondary | ICD-10-CM | POA: Diagnosis present

## 2013-01-30 DIAGNOSIS — J40 Bronchitis, not specified as acute or chronic: Secondary | ICD-10-CM

## 2013-01-30 DIAGNOSIS — R7989 Other specified abnormal findings of blood chemistry: Secondary | ICD-10-CM

## 2013-01-30 DIAGNOSIS — K5900 Constipation, unspecified: Secondary | ICD-10-CM | POA: Diagnosis present

## 2013-01-30 DIAGNOSIS — E119 Type 2 diabetes mellitus without complications: Secondary | ICD-10-CM | POA: Diagnosis present

## 2013-01-30 DIAGNOSIS — R0602 Shortness of breath: Secondary | ICD-10-CM

## 2013-01-30 DIAGNOSIS — J4489 Other specified chronic obstructive pulmonary disease: Secondary | ICD-10-CM | POA: Diagnosis present

## 2013-01-30 DIAGNOSIS — E1149 Type 2 diabetes mellitus with other diabetic neurological complication: Secondary | ICD-10-CM | POA: Diagnosis present

## 2013-01-30 DIAGNOSIS — J449 Chronic obstructive pulmonary disease, unspecified: Secondary | ICD-10-CM | POA: Diagnosis present

## 2013-01-30 DIAGNOSIS — E1142 Type 2 diabetes mellitus with diabetic polyneuropathy: Secondary | ICD-10-CM | POA: Diagnosis present

## 2013-01-30 HISTORY — DX: Chronic obstructive pulmonary disease, unspecified: J44.9

## 2013-01-30 HISTORY — DX: Orthopnea: R06.01

## 2013-01-30 HISTORY — DX: Other cystitis without hematuria: N30.80

## 2013-01-30 HISTORY — DX: Benign prostatic hyperplasia without lower urinary tract symptoms: N40.0

## 2013-01-30 HISTORY — DX: Type 2 diabetes mellitus with diabetic neuropathy, unspecified: E11.40

## 2013-01-30 LAB — CBC
Hemoglobin: 14.1 g/dL (ref 13.0–17.0)
MCH: 29.4 pg (ref 26.0–34.0)
MCH: 29.7 pg (ref 26.0–34.0)
MCHC: 34.3 g/dL (ref 30.0–36.0)
Platelets: 154 10*3/uL (ref 150–400)
Platelets: 157 10*3/uL (ref 150–400)
RBC: 4.79 MIL/uL (ref 4.22–5.81)
RBC: 4.45 MIL/uL (ref 4.22–5.81)
RDW: 13.7 % (ref 11.5–15.5)

## 2013-01-30 LAB — BASIC METABOLIC PANEL
CO2: 24 mEq/L (ref 19–32)
Calcium: 9.2 mg/dL (ref 8.4–10.5)
GFR calc non Af Amer: >90 mL/min (ref >90)
Glucose, Bld: 221 mg/dL — ABNORMAL HIGH (ref 70–99)
Potassium: 4.2 mEq/L (ref 3.5–5.1)
Sodium: 142 mEq/L (ref 135–145)

## 2013-01-30 LAB — LACTIC ACID, PLASMA: Lactic Acid, Venous: 6.6 mmol/L — ABNORMAL HIGH (ref 0.5–2.2)

## 2013-01-30 LAB — URINALYSIS W MICROSCOPIC + REFLEX CULTURE
Glucose, UA: 250 mg/dL — AB
Protein, ur: NEGATIVE mg/dL
Specific Gravity, Urine: 1.026 (ref 1.005–1.030)
Urobilinogen, UA: 0.2 mg/dL (ref 0.0–1.0)

## 2013-01-30 LAB — CREATININE, SERUM: Creatinine, Ser: 0.59 mg/dL (ref 0.50–1.35)

## 2013-01-30 LAB — GLUCOSE, CAPILLARY: Glucose-Capillary: 272 mg/dL — ABNORMAL HIGH (ref 70–99)

## 2013-01-30 LAB — DIFFERENTIAL
Lymphocytes Relative: 7 % — ABNORMAL LOW (ref 12–46)
Lymphs Abs: 0.6 10*3/uL — ABNORMAL LOW (ref 0.7–4.0)
Monocytes Absolute: 0.1 10*3/uL (ref 0.1–1.0)
Monocytes Relative: 1 % — ABNORMAL LOW (ref 3–12)
Neutro Abs: 8.6 10*3/uL — ABNORMAL HIGH (ref 1.7–7.7)
Neutrophils Relative %: 91 % — ABNORMAL HIGH (ref 43–77)

## 2013-01-30 LAB — BLOOD GAS, ARTERIAL
Acid-base deficit: 6.2 mmol/L — ABNORMAL HIGH (ref 0.0–2.0)
Drawn by: 10006
O2 Saturation: 93.2 %
TCO2: 19.2 mmol/L (ref 0–100)
pO2, Arterial: 68.3 mmHg — ABNORMAL LOW (ref 80.0–100.0)

## 2013-01-30 MED ORDER — HYDROCHLOROTHIAZIDE 12.5 MG PO CAPS
12.5000 mg | ORAL_CAPSULE | Freq: Every day | ORAL | Status: DC
  Administered 2013-01-31 – 2013-02-01 (×2): 12.5 mg via ORAL
  Filled 2013-01-30 (×2): qty 1

## 2013-01-30 MED ORDER — LISINOPRIL-HYDROCHLOROTHIAZIDE 10-12.5 MG PO TABS: 1.0000 | ORAL_TABLET | Freq: Every day | ORAL | Status: DC

## 2013-01-30 MED ORDER — PANTOPRAZOLE SODIUM 40 MG PO TBEC
40.0000 mg | DELAYED_RELEASE_TABLET | Freq: Every day | ORAL | Status: DC
  Administered 2013-01-31 – 2013-02-01 (×2): 40 mg via ORAL
  Filled 2013-01-30 (×2): qty 1

## 2013-01-30 MED ORDER — ACETAMINOPHEN 650 MG RE SUPP: 650.0000 mg | Freq: Four times a day (QID) | RECTAL | Status: DC | PRN

## 2013-01-30 MED ORDER — ACETAMINOPHEN 325 MG PO TABS: 650.0000 mg | ORAL_TABLET | Freq: Four times a day (QID) | ORAL | Status: DC | PRN

## 2013-01-30 MED ORDER — MONTELUKAST SODIUM 10 MG PO TABS
10.0000 mg | ORAL_TABLET | Freq: Every day | ORAL | Status: DC
  Administered 2013-01-31: 10 mg via ORAL
  Filled 2013-01-30 (×2): qty 1

## 2013-01-30 MED ORDER — SODIUM CHLORIDE 0.9 % IJ SOLN: 3.0000 mL | Freq: Two times a day (BID) | INTRAMUSCULAR | Status: DC

## 2013-01-30 MED ORDER — PNEUMOCOCCAL VAC POLYVALENT 25 MCG/0.5ML IJ INJ
0.5000 mL | INJECTION | INTRAMUSCULAR | Status: AC
  Administered 2013-01-31: 0.5 mL via INTRAMUSCULAR
  Filled 2013-01-30: qty 0.5

## 2013-01-30 MED ORDER — TAMSULOSIN HCL 0.4 MG PO CAPS
0.8000 mg | ORAL_CAPSULE | Freq: Every day | ORAL | Status: DC
  Administered 2013-01-30 – 2013-01-31 (×2): 0.8 mg via ORAL
  Filled 2013-01-30 (×3): qty 2

## 2013-01-30 MED ORDER — DEXTROSE 5 % IV SOLN
1.0000 g | Freq: Once | INTRAVENOUS | Status: AC
  Administered 2013-01-30: 1 g via INTRAVENOUS
  Filled 2013-01-30: qty 10

## 2013-01-30 MED ORDER — ALBUTEROL SULFATE (2.5 MG/3ML) 0.083% IN NEBU
2.5000 mg | INHALATION_SOLUTION | Freq: Once | RESPIRATORY_TRACT | Status: AC
  Administered 2013-01-30: 2.5 mg via RESPIRATORY_TRACT

## 2013-01-30 MED ORDER — HEPARIN SODIUM (PORCINE) 5000 UNIT/ML IJ SOLN
5000.0000 [IU] | Freq: Three times a day (TID) | INTRAMUSCULAR | Status: DC
Start: 2013-01-30 — End: 2013-02-01
  Administered 2013-01-31 (×3): 5000 [IU] via SUBCUTANEOUS
  Filled 2013-01-30 (×8): qty 1

## 2013-01-30 MED ORDER — ALBUTEROL SULFATE (5 MG/ML) 0.5% IN NEBU
2.5000 mg | INHALATION_SOLUTION | RESPIRATORY_TRACT | Status: DC
  Administered 2013-01-30 – 2013-01-31 (×4): 2.5 mg via RESPIRATORY_TRACT
  Filled 2013-01-30 (×6): qty 0.5

## 2013-01-30 MED ORDER — IPRATROPIUM BROMIDE 0.02 % IN SOLN
0.5000 mg | RESPIRATORY_TRACT | Status: DC
  Administered 2013-01-30: 0.5 mg via RESPIRATORY_TRACT
  Filled 2013-01-30: qty 2.5

## 2013-01-30 MED ORDER — IPRATROPIUM BROMIDE 0.02 % IN SOLN
0.5000 mg | Freq: Once | RESPIRATORY_TRACT | Status: AC
  Administered 2013-01-30: 0.5 mg via RESPIRATORY_TRACT
  Filled 2013-01-30: qty 2.5

## 2013-01-30 MED ORDER — POLYETHYLENE GLYCOL 3350 17 G PO PACK
17.0000 g | PACK | Freq: Every day | ORAL | Status: DC
  Administered 2013-01-30 – 2013-01-31 (×2): 17 g via ORAL
  Filled 2013-01-30 (×3): qty 1

## 2013-01-30 MED ORDER — METHYLPREDNISOLONE SODIUM SUCC 125 MG IJ SOLR
125.0000 mg | Freq: Four times a day (QID) | INTRAMUSCULAR | Status: DC
  Administered 2013-01-30 – 2013-01-31 (×3): 125 mg via INTRAVENOUS
  Filled 2013-01-30 (×7): qty 2

## 2013-01-30 MED ORDER — FINASTERIDE 5 MG PO TABS
5.0000 mg | ORAL_TABLET | Freq: Every day | ORAL | Status: DC
  Administered 2013-01-30 – 2013-02-01 (×3): 5 mg via ORAL
  Filled 2013-01-30 (×3): qty 1

## 2013-01-30 MED ORDER — GABAPENTIN 100 MG PO CAPS
200.0000 mg | ORAL_CAPSULE | Freq: Three times a day (TID) | ORAL | Status: DC
  Administered 2013-01-30 – 2013-02-01 (×5): 200 mg via ORAL
  Filled 2013-01-30 (×7): qty 2

## 2013-01-30 MED ORDER — SIMVASTATIN 20 MG PO TABS
20.0000 mg | ORAL_TABLET | Freq: Every day | ORAL | Status: DC
  Administered 2013-01-31: 20 mg via ORAL
  Filled 2013-01-30 (×2): qty 1

## 2013-01-30 MED ORDER — DEXTROSE 5 % IV SOLN
1.0000 g | INTRAVENOUS | Status: DC
  Administered 2013-01-31: 1 g via INTRAVENOUS
  Filled 2013-01-30 (×2): qty 10

## 2013-01-30 MED ORDER — ALBUTEROL SULFATE (5 MG/ML) 0.5% IN NEBU: 2.5000 mg | INHALATION_SOLUTION | RESPIRATORY_TRACT | Status: DC | PRN

## 2013-01-30 MED ORDER — ALBUTEROL SULFATE (5 MG/ML) 0.5% IN NEBU
5.0000 mg | INHALATION_SOLUTION | Freq: Once | RESPIRATORY_TRACT | Status: AC
  Administered 2013-01-30: 5 mg via RESPIRATORY_TRACT
  Filled 2013-01-30: qty 1

## 2013-01-30 MED ORDER — GUAIFENESIN 100 MG/5ML PO SYRP
200.0000 mg | ORAL_SOLUTION | ORAL | Status: DC | PRN
  Administered 2013-01-30: 200 mg via ORAL
  Filled 2013-01-30 (×2): qty 10

## 2013-01-30 MED ORDER — LISINOPRIL 10 MG PO TABS
10.0000 mg | ORAL_TABLET | Freq: Every day | ORAL | Status: DC
  Administered 2013-01-31 – 2013-02-01 (×2): 10 mg via ORAL
  Filled 2013-01-30 (×2): qty 1

## 2013-01-30 MED ORDER — SODIUM CHLORIDE 0.9 % IV SOLN
INTRAVENOUS | Status: DC
  Administered 2013-01-30: 18:00:00 via INTRAVENOUS

## 2013-01-30 MED ORDER — INSULIN ASPART 100 UNIT/ML ~~LOC~~ SOLN
0.0000 [IU] | Freq: Three times a day (TID) | SUBCUTANEOUS | Status: DC
  Administered 2013-01-31: 2 [IU] via SUBCUTANEOUS
  Administered 2013-01-31 (×2): 3 [IU] via SUBCUTANEOUS
  Administered 2013-02-01: 2 [IU] via SUBCUTANEOUS

## 2013-01-30 MED ORDER — DEXTROSE 5 % IV SOLN
500.0000 mg | INTRAVENOUS | Status: DC
  Administered 2013-01-30 – 2013-01-31 (×2): 500 mg via INTRAVENOUS
  Filled 2013-01-30 (×3): qty 500

## 2013-01-30 NOTE — ED Notes (Signed)
Pt reports left sided chest pain when he coughs.

## 2013-01-30 NOTE — ED Notes (Signed)
Admitting MD at bedside.

## 2013-01-30 NOTE — ED Provider Notes (Signed)
CSN: [id number]     Arrival date & time 01/30/13  1317 History     First MD Initiated Contact with Patient 01/30/13 1319     Chief Complaint  Patient presents with  . Shortness of Breath  . Hypertension   (Consider location/radiation/quality/duration/timing/severity/associated sxs/prior Treatment) HPI Patient is a 79 year old. Then the male who was transferred here by EMS from [facility] urgent care with complaint of shortness of breath. Patient states he has had cough for about 3-4 days and increased shortness of breath. States he ran out of all of his medications a couple of weeks ago. Patient states he goes to [place] for his urgent care needs however he does not have a primary care doctor. Patient states he has also a left-sided chest pain with cough only. States he does not have any pain if he is not coughing. Patient admits to shortness of breath. Patient denies any fever chills, malaise. Pt did not have any medications to take for his symptoms home. She denies any prior cardiac problems. Patient is given a speaking only an interpreter foam was used for translation. Past Medical History  Diagnosis Date  . Diabetes mellitus   . Hyperlipidemia   . Hypertension    History reviewed. No pertinent past surgical history. History reviewed. No pertinent family history. History  Substance Use Topics  . Smoking status: Former Games developer  . Smokeless tobacco: Not on file  . Alcohol Use: No    Review of Systems  Constitutional: Negative for fever and chills.  HENT: Negative for neck pain and neck stiffness.   Respiratory: Positive for cough, chest tightness, shortness of breath and wheezing.   Cardiovascular: Negative for chest pain and leg swelling.  Gastrointestinal: Negative.   Genitourinary: Negative for dysuria and flank pain.  Musculoskeletal: Negative.   Skin: Negative.   Neurological: Negative for dizziness and headaches.    Allergies  Review of patient's allergies indicates no  known allergies.  Home Medications   Current Outpatient Rx  Name  Route  Sig  Dispense  Refill  . albuterol (VENTOLIN HFA) 108 (90 BASE) MCG/ACT inhaler      Inhale 1-2 puffs every 4 hours as needed for breathing difficulties          . finasteride (PROSCAR) 5 MG tablet   Oral   Take 1 tablet (5 mg total) by mouth daily.   30 tablet   2   . Fluticasone-Salmeterol (ADVAIR) 500-50 MCG/DOSE AEPB   Inhalation   Inhale 1 puff into the lungs 2 (two) times daily.   60 each   11   . gabapentin (NEURONTIN) 100 MG capsule   Oral   Take 2 capsules (200 mg total) by mouth 3 (three) times daily.   180 capsule   3   . lisinopril-hydrochlorothiazide (PRINZIDE,ZESTORETIC) 10-12.5 MG per tablet   Oral   Take 1 tablet by mouth daily.         . metFORMIN (GLUCOPHAGE) 500 MG tablet   Oral   Take 1 tablet (500 mg total) by mouth 2 (two) times daily with a meal. 1 tablet by mouth twice daily for diabetes   60 tablet   5   . metroNIDAZOLE (FLAGYL) 500 MG tablet   Oral   Take 1 tablet (500 mg total) by mouth every 8 (eight) hours.   21 tablet   0   . montelukast (SINGULAIR) 10 MG tablet      take 1 tablet by mouth once daily   30 tablet  11   . omeprazole (PRILOSEC) 20 MG capsule   Oral   Take 1 capsule (20 mg total) by mouth daily.   30 capsule   6   . polyethylene glycol (MIRALAX / GLYCOLAX) packet   Oral   Take 17 g by mouth daily as needed (constipation).   24 each   0   . pravastatin (PRAVACHOL) 40 MG tablet   Oral   Take 1 tablet (40 mg total) by mouth daily.   30 tablet   5   . tamsulosin (FLOMAX) 0.4 MG CAPS   Oral   Take 2 capsules (0.8 mg total) by mouth daily after supper.   60 capsule   2   . tiotropium (SPIRIVA) 18 MCG inhalation capsule   Inhalation   Place 1 capsule (18 mcg total) into inhaler and inhale daily. 1 cap inhaled daily   30 capsule   11    BP 166/90  Pulse 92  Temp(Src) 98 F (36.7 C)  Resp 22  SpO2 100% Physical Exam   Nursing note and vitals reviewed. Constitutional: He is oriented to person, place, and time. He appears well-developed and well-nourished. No distress.  HENT:  Head: Normocephalic and atraumatic.  Eyes: Conjunctivae are normal. Pupils are equal, round, and reactive to light.  Neck: Neck supple.  Cardiovascular: Normal rate, regular rhythm and normal heart sounds.   Pulmonary/Chest: Effort normal. He has wheezes. He has rales.  tachypnec  Abdominal: Soft. Bowel sounds are normal. He exhibits no distension. There is no tenderness. There is no rebound.  Musculoskeletal: He exhibits no edema.  Neurological: He is alert and oriented to person, place, and time. No cranial nerve deficit. Coordination normal.  Skin: Skin is warm and dry.    ED Course   Procedures (including critical care time)   Date: 01/30/2013  Rate: 96  Rhythm: normal sinus rhythm  QRS Axis: normal  Intervals: QT prolonged  ST/T Wave abnormalities: normal  Conduction Disutrbances:left anterior fascicular block  Narrative Interpretation:   Old EKG Reviewed: unchanged Results for orders placed during the hospital encounter of 01/30/13  CBC      Result Value Range   WBC 9.7  4.0 - 10.5 K/uL   RBC 4.79  4.22 - 5.81 MIL/uL   Hemoglobin 14.1  13.0 - 17.0 g/dL   HCT 10.2  72.5 - 36.6 %   MCV 87.9  78.0 - 100.0 fL   MCH 29.4  26.0 - 34.0 pg   MCHC 33.5  30.0 - 36.0 g/dL   RDW 44.0  34.7 - 42.5 %   Platelets 154  150 - 400 K/uL  BASIC METABOLIC PANEL      Result Value Range   Sodium 142  135 - 145 mEq/L   Potassium 4.2  3.5 - 5.1 mEq/L   Chloride 103  96 - 112 mEq/L   CO2 24  19 - 32 mEq/L   Glucose, Bld 221 (*) 70 - 99 mg/dL   BUN 19  6 - 23 mg/dL   Creatinine, Ser 9.56  0.50 - 1.35 mg/dL   Calcium 9.2  8.4 - 38.7 mg/dL   GFR calc non Af Amer >90  >90 mL/min   GFR calc Af Amer >90  >90 mL/min  URINALYSIS W MICROSCOPIC + REFLEX CULTURE      Result Value Range   Color, Urine YELLOW  YELLOW   APPearance  CLOUDY (*) CLEAR   Specific Gravity, Urine 1.026  1.005 - 1.030   pH  6.0  5.0 - 8.0   Glucose, UA 250 (*) NEGATIVE mg/dL   Hgb urine dipstick NEGATIVE  NEGATIVE   Bilirubin Urine NEGATIVE  NEGATIVE   Ketones, ur 15 (*) NEGATIVE mg/dL   Protein, ur NEGATIVE  NEGATIVE mg/dL   Urobilinogen, UA 0.2  0.0 - 1.0 mg/dL   Nitrite NEGATIVE  NEGATIVE   Leukocytes, UA SMALL (*) NEGATIVE   WBC, UA 11-20  <3 WBC/hpf   RBC / HPF 0-2  <3 RBC/hpf   Bacteria, UA MANY (*) RARE   Squamous Epithelial / LPF RARE  RARE  DIFFERENTIAL      Result Value Range   Neutrophils Relative % 91 (*) 43 - 77 %   Neutro Abs 8.6 (*) 1.7 - 7.7 K/uL   Lymphocytes Relative 7 (*) 12 - 46 %   Lymphs Abs 0.6 (*) 0.7 - 4.0 K/uL   Monocytes Relative 1 (*) 3 - 12 %   Monocytes Absolute 0.1  0.1 - 1.0 K/uL   Eosinophils Relative 2  0 - 5 %   Eosinophils Absolute 0.1  0.0 - 0.7 K/uL   Basophils Relative 0  0 - 1 %   Basophils Absolute 0.0  0.0 - 0.1 K/uL  CG4 I-STAT (LACTIC ACID)      Result Value Range   Lactic Acid, Venous 6.26 (*) 0.5 - 2.2 mmol/L  POCT I-STAT TROPONIN I      Result Value Range   Troponin i, poc 0.00  0.00 - 0.08 ng/mL   Comment 3            Dg Chest 2 View  01/30/2013   *RADIOLOGY REPORT*  Clinical Data: Cough and short of breath  CHEST - 2 VIEW  Comparison: 11/06/2012  Findings: The patient appears to have lost weight since the prior study.  Heart size and vascularity are normal.  Lungs are clear without infiltrate or effusion.  Negative for mass lesion.  IMPRESSION: No active cardiopulmonary abnormality.   Original Report Authenticated By: Janeece Riggers, M.D.     1. Bronchitis   2. UTI (lower urinary tract infection)   3. Elevated lactic acid level     MDM  Patient in the emergency department with shortness of breath and cough. She does have history of COPD versus asthma. He was seen at [facility] arching care, received albuterol treatment, Solu-Medrol 125 IV by EMS, and transported here for further  treatment. Patient states he currently has left-sided chest pain, which is only there with coughing, and shortness of breath. We'll get chest x-ray is normal labs and will continue to monitor. Another neb treatment ordered in the emergency department  4:20 PM  Chest chest x-ray is negative lab work unremarkable other than elevated lactic acid of 6.26 and left shift with 91% neutrophils. The patient is afebrile does not appear septic. UA does look infected. Patient continues to have shortness of breath, able to maintain his oxygen saturation above 95% room air. Given patient's elevated lactic acid and left shift in his differential malignant forearms and IV antibiotics. No other sources of infection found. Patient does have history of urosepsis several months ago and was admitted to the hospital at that time. I have discussed the patient with family practice service given patient is a [facility] arching care patient. They will come and see patient in emergency department and admit him. I have also discussed results of plan with the patient again using interpreter patient states he agrees with the plan.  Filed Vitals:  01/30/13 1338 01/30/13 1340 01/30/13 1400  BP: 166/90  169/81  Pulse: 92  96  Temp: 98 F (36.7 C)    Resp: 22  20  SpO2: 96% 100% 100%      Seif Teichert A Wayman Hoard, PA-C 01/30/13 1622

## 2013-01-30 NOTE — ED Notes (Signed)
To ED via [facility], from [facility], for eval of increased SOB/HTN. Yearly hx of pneumonia per report. Pt received 1 duo neb, 1 albuterol tx, and 125mg  solumedrol iv pta. Pt alert.

## 2013-01-30 NOTE — H&P (Signed)
Family Medicine Teaching [facility] Admission History and Physical Service Pager: [phone]  Patient name: Nathaniel [name] Medical record number: [id number] Date of birth: May 13, 1934 Age: 79 y.o. Gender: male  Primary Care Provider: No primary provider on file. Consultants: none  Code Status: Full   Chief Complaint: shortness of breath and cough  Assessment and Plan: Nathaniel [name] is a 79 y.o. year old male presenting with a possible COPD exacerbation. PMH is significant for BPH, HTN, Diabetic neuropathy, DM, and emphysematous cystitis.   #Shortness of breath/Cough: 3 day history of SOB even while at rest. Possible COPD exacerbation but PE could be in the differential. He hasn't been walking around recently due to shortness of breath. CXR was clear of any active infection.  Legs are not erythematous or edematous.  Has history of COPD but no trigger identified. Sat >99% on RA. With history of dyspnea with laying flat, newly diagnosed CHF is also part of the differential.   - cont. duo nebs 2.5 Q4 scheduled, albuterol Q2 PRN - continue solumedrol 125IV q6 for 24hrs and reassess for possible transition to po prednisone - azithromycin and ceftriaxone for COPD exacerbation coverage - incentive spirometry  - cont pulse oximetry  - Well's score ~4 based on history: will obtain d-dimer. If elevated, will get CTA chest - obtain troponin x3 and morning EKG for ischemic workup in context of chest pain and shortness of breath, although chest pain appears to be more musculoskeletal in nature, triggered by cough - get Echo and pro-BNP to assess for any component of heart failure that could be contributing to the dyspnea.    # Lactic acidosis: unclear etiology. Differential includes: sepsis vs metformin vs poor perfusion.  - ABG to confirm any acidosis  - stop metformin - follow up urine culture, obtain blood culture, although will likely be negative since ceftriaxone given beforehand.   #UTI: Denies  frequency, urgency or burning during urination. History of BPH and emphysematous cystitis and urosepsis in May 2014.  - UA was cloudy, glucose 250, many bacteria, 15 ketones, and small leukocytes - Rocephin x1 in ED. Continue rocephin - Admission in May was treated with keflex. Bacteria on urine culture showed Multiple bacterial morphotypes present, none predominant.  - Lactic Acid: 6.2 and tachycardic  but not febrile, hypotensive or elevated WBC  - continue with ceftriaxone [ ]  F/u urine Cx:  [ ]  f/u CMET, CBC [ ] f/u urine output  #chest pain:  Possible MI, costochondritis or MSK chest pain due to continued coughing. History of Brugada on EKG in May witnessed by EMS. Cardiology was consulted and were not impressed due to in house EKG's being normal. - troponin negative x 1  - EKG [ ]  F/u EKG AM [ ] f/u troponins  #HTN:  - cont home meds Lisinopril/HCTZ   #DM: glucose in urine  - hold home Metformim - sensitive SSI while hospitalized  - A1C: 7.2 on 09/16/12. Repeat A1C  #BPH: Previous admission in May a bladder scan showed a large amount of retained urine. He did not want to have a foley due to cultural differences. He was sent home with home health and instructed to in and out cath regularly. He was also started on Tamsulosin and Finasteride. Urology was consulted and recommend a cystoscopy in outpatient follow up. Unsure if it was performed.  - home meds: Finasteride and Tamsulosin   [ ]  monitor urine output [ ]  Urology c/s ?  # Constipation: no bowel movement in 5 days.  -  miralax daily   FEN/GI: Carb modified diet + NS  Prophylaxis: SubQ heparin   Disposition: admitted to telemetry and family medicine teaching service on tele floor for CHF exacerbation  History of Present Illness: Nathaniel [name] is a 79 y.o. year old male presenting with shortness of breath and coughing for the past couple of days. The coughing was causing chest pain in left side of chest as well as abdominal pain.  Cough is non productive. No hemoptysis. He has no congestion. No fever, no chills, no nausea. He gets short of breath while walking but laying flat at night is what causes the most difficulty. He has been waking up in the middle of the night short of breath for the last 2 days. He denies any lower extremity swelling. He reports that he has been taking his inhaled medications at home. These include spiriva, albuterol and advair.   He was seen at [facility] for shortness of breath with chest pain. He did not improve with an albuterol treatment and solumedrol. He was referred to the [facility] ED where he was given another albuterol treatment. He has been on RA with O2 in the 90's. Found to have suspected UTI with UA showing cloudy, 250 Glc, 15 ketones, small leukocytes and many bacteria. Urine culture pending.  Received one dose Rocephin in ED. CXR was nml.  Lactic Acid 6.2. Has a history of urosepsis back in May   The history was obtained through the language line using [place] but the translation to his specific dialect of language is not 100%. Certain aspects of the conversation were lost. He has a daughter that can speak English but her contact information is yet to be identified.   He is from [place] and speaks ede as his dialect.    Review Of Systems: Per HPI with the following additions:  He denies any abdominal pain, any dysuria, any increased urinary frequency. He has 2-3 episodes of urination during the day and 2 at night.  He has not had a bowel movement in 5 days. He normally goes once a day. He has been eating and drinking normally.  He has numbness in his lower extremities that has been ongoing for 2-3 years. He also has involuntary movements of his arms and legs for which he was seen by his doctor but wasn't given a diagnosis.   Otherwise 12 point review of systems was performed and was unremarkable.  Patient Active Problem List   Diagnosis Date Noted  . Emphysematous cystitis  02/07/2012  . GERD (gastroesophageal reflux disease) 01/03/2012  . HTN (hypertension) 09/16/2011  . Hyperlipidemia 09/16/2011  . Diabetes mellitus 09/16/2011  . Neuropathy 09/16/2011  . PULMONARY NODULE 10/02/2008  . Essential hypertension, benign 01/10/2008  . DIABETES MELLITUS 12/14/2007  . HYPERTHYROIDISM, SUBCLINICAL 11/09/2007  . COPD 11/09/2007  . INSOMNIA 11/09/2007   Past Medical History: Past Medical History  Diagnosis Date  . Diabetes mellitus   . Hyperlipidemia   . Hypertension    Past Surgical History: History reviewed. No pertinent past surgical history. Social History: History  Substance Use Topics  . Smoking status: Former Games developer  . Smokeless tobacco: Not on file  . Alcohol Use: No   Additional social history: Quit 15 years ago, 2-3 rolled cigarettes for 3-4.   Please also refer to relevant sections of EMR.  Family History: History reviewed. No pertinent family history. Allergies and Medications: No Known Allergies No current facility-administered medications on file prior to encounter.   Current Outpatient Prescriptions  on File Prior to Encounter  Medication Sig Dispense Refill  . albuterol (VENTOLIN HFA) 108 (90 BASE) MCG/ACT inhaler Inhale 1 puff into the lungs 4 (four) times daily. Inhale 1-2 puffs every 4 hours as needed for breathing difficulties      . Fluticasone-Salmeterol (ADVAIR) 500-50 MCG/DOSE AEPB Inhale 1 puff into the lungs 2 (two) times daily.  60 each  11  . lisinopril-hydrochlorothiazide (PRINZIDE,ZESTORETIC) 10-12.5 MG per tablet Take 1 tablet by mouth daily.      . metFORMIN (GLUCOPHAGE) 500 MG tablet Take 1 tablet (500 mg total) by mouth 2 (two) times daily with a meal. 1 tablet by mouth twice daily for diabetes  60 tablet  5  . omeprazole (PRILOSEC) 20 MG capsule Take 1 capsule (20 mg total) by mouth daily.  30 capsule  6  . pravastatin (PRAVACHOL) 40 MG tablet Take 1 tablet (40 mg total) by mouth daily.  30 tablet  5  . tiotropium  (SPIRIVA) 18 MCG inhalation capsule Place 1 capsule (18 mcg total) into inhaler and inhale daily. 1 cap inhaled daily  30 capsule  11    Objective: BP 169/81  Pulse 96  Temp(Src) 98 F (36.7 C)  Resp 20  SpO2 100% Exam: General: NAD, Cooperative on exam, smiling and non toxic appearing HEENT: MMM, EOMI, PERRL, tympanic membranes intact bil  Cardiovascular: RRR, S1S2, no mrg Respiratory: diminished air movement in lower bases, occasional wheezing in lung apices, speaks in full sentences with some dyspnea with talking.  Abdomen: soft, NTND, +BS Extremities: +2 pulses in upper and lower extremities bil. No pitting edema  Skin: no rashes  Neuro: alert and oriented, CN2-12 grossly intact, involuntary movement of arms and legs which he qualifies as chronic, normal strength bilaterally in upper and lower extremities   Labs and Imaging: CBC BMET   Recent Labs Lab 01/30/13 1455  WBC 9.7  HGB 14.1  HCT 42.1  PLT 154    Recent Labs Lab 01/30/13 1455  NA 142  K 4.2  CL 103  CO2 24  BUN 19  CREATININE 0.64  GLUCOSE 221*  CALCIUM 9.2      CHEST - 2 VIEW  Comparison: 11/06/2012 IMPRESSION:  No active cardiopulmonary abnormality.  EKG:   Clare Gandy, MD 01/30/2013, 4:34 PM PGY-1, [facility] Health Family Medicine FPTS Intern pager: [phone], text pages welcome   Patient seen, examined. Available data reviewed. Agree with findings, assessment, and plan as outlined by Dr. Jordan Likes.  My additional findings are documented and highlighted above in blue.

## 2013-01-30 NOTE — Progress Notes (Signed)
[facility] [address] [place] [address] [phone]  Date:  01/30/2013   Name:  Nathaniel [name]   DOB:  11/02/1933   MRN:  [id number]  PCP:  No primary provider on file.    Chief Complaint: Shortness of Breath, Wheezing and Asthma   History of Present Illness:  Nathaniel [name] is a 79 y.o. very pleasant male patient who presents with the following:  Brought back urgently with SOB/ wheezing,  History of asthma per his daughter.  They were not aware of any prostate issues, so albuterol and atrovent nebs started as well as O2 via [place].  However, chart review then revealed history of BPH/ urinary retention, so the neb was stopped and he was changed to a plain albuterol neb.  Daughter states he has had increased SOB and cough for about 3 days.  He has pain when he coughs.  History of COPD. ?if history of asthma.  He was admitted in May of this year with UTI/ urinary retention/ sepsis.   He was on adviar, albuterol and spiriva at that time, sent home with same meds.    EMS came to their home last night for SOB, treated with a nebulizer treatment of some sort but did not transport him.  He states that he thought he was ok then but has since gotten worse.  No fever today.   Daughter had to leave to pick up her daughter- will call and let her know that Laden is at [facility]  Patient Active Problem List   Diagnosis Date Noted  . 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 GERD (gastroesophageal reflux disease) 01/03/2012  . HTN (hypertension) 09/16/2011  . Hyperlipidemia 09/16/2011  . Diabetes mellitus 09/16/2011  . Neuropathy 09/16/2011  . PULMONARY NODULE 10/02/2008  . Essential hypertension, benign 01/10/2008  . DIABETES MELLITUS 12/14/2007  . HYPERTHYROIDISM, SUBCLINICAL 11/09/2007  . COPD 11/09/2007  . INSOMNIA 11/09/2007    Past Medical History  Diagnosis Date  . Diabetes mellitus   . Hyperlipidemia   . Hypertension     No past surgical history on file.  History   Substance Use Topics  . Smoking status: Former Games developer  . Smokeless tobacco: Not on file  . Alcohol Use: No    No family history on file.  No Known Allergies  Medication list has been reviewed and updated.  Current Outpatient Prescriptions on File Prior to Visit  Medication Sig Dispense Refill  . albuterol (VENTOLIN HFA) 108 (90 BASE) MCG/ACT inhaler Inhale 1-2 puffs every 4 hours as needed for breathing difficulties       . Fluticasone-Salmeterol (ADVAIR) 500-50 MCG/DOSE AEPB Inhale 1 puff into the lungs 2 (two) times daily.  60 each  11  . finasteride (PROSCAR) 5 MG tablet Take 1 tablet (5 mg total) by mouth daily.  30 tablet  2  . gabapentin (NEURONTIN) 100 MG capsule Take 2 capsules (200 mg total) by mouth 3 (three) times daily.  180 capsule  3  . lisinopril-hydrochlorothiazide (PRINZIDE,ZESTORETIC) 10-12.5 MG per tablet Take 1 tablet by mouth daily.      . metFORMIN (GLUCOPHAGE) 500 MG tablet Take 1 tablet (500 mg total) by mouth 2 (two) times daily with a meal. 1 tablet by mouth twice daily for diabetes  60 tablet  5  . metroNIDAZOLE (FLAGYL) 500 MG tablet Take 1 tablet (500 mg total) by mouth every 8 (eight) hours.  21 tablet  0  . montelukast (SINGULAIR) 10 MG tablet take 1 tablet  by mouth once daily  30 tablet  11  . omeprazole (PRILOSEC) 20 MG capsule Take 1 capsule (20 mg total) by mouth daily.  30 capsule  6  . polyethylene glycol (MIRALAX / GLYCOLAX) packet Take 17 g by mouth daily as needed (constipation).  24 each  0  . pravastatin (PRAVACHOL) 40 MG tablet Take 1 tablet (40 mg total) by mouth daily.  30 tablet  5  . tamsulosin (FLOMAX) 0.4 MG CAPS Take 2 capsules (0.8 mg total) by mouth daily after supper.  60 capsule  2  . tiotropium (SPIRIVA) 18 MCG inhalation capsule Place 1 capsule (18 mcg total) into inhaler and inhale daily. 1 cap inhaled daily  30 capsule  11   No current facility-administered medications on file prior to visit.    Review of Systems:  As per  HPI- otherwise negative.   Physical Examination: Filed Vitals:   01/30/13 1202  BP: 140/100  Pulse: 99  Temp: 97.9 F (36.6 C)  Resp: 26   There were no vitals filed for this visit. There is no weight on file to calculate BMI. Ideal Body Weight:    GEN: WDWN, retracting and SOB.  Speaks some English HEENT: Atraumatic, Normocephalic. Neck supple. No masses, No LAD. Ears and Nose: No external deformity. CV: RRR, No M/G/R. No JVD. No thrill. No extra heart sounds. PULM: diffuse wheezing bilaterally.  Noted to have retractions  ABD: S, NT, ND, +BS. No rebound. No HSM. EXTR: No c/c/e NEURO Normal gait.  PSYCH: Normally interactive. Conversant. Not depressed or anxious appearing.  Calm demeanor.    As above, started albuterol/ atrovent neb but stopped after approx 3 minutes due to history of urinary retention/ enlarged prostate per chart. Changed to a plain albuterol neb.  Placed O2 via [place].   Given 2.5 mg of albuterol neb.  20 minutes after first neb started a 2nd neb as his sx persisted.  Called EMS for transport.    Assessment and Plan: SOB (shortness of breath) - Plan: albuterol (PROVENTIL) (2.5 MG/3ML) 0.083% nebulizer solution 2.5 mg  Likely asthma attack/ RAD in pt with history of COPD.  He continues to have retractions after neb treatment.  Referral to ER for further treatment.   Of note pt did receive a small amount of atrovent.  Caution regarding urinary retention   Signed Abbe Amsterdam, MD

## 2013-01-31 DIAGNOSIS — E119 Type 2 diabetes mellitus without complications: Secondary | ICD-10-CM

## 2013-01-31 DIAGNOSIS — N390 Urinary tract infection, site not specified: Secondary | ICD-10-CM

## 2013-01-31 DIAGNOSIS — E872 Acidosis: Secondary | ICD-10-CM

## 2013-01-31 DIAGNOSIS — R0602 Shortness of breath: Secondary | ICD-10-CM

## 2013-01-31 DIAGNOSIS — J449 Chronic obstructive pulmonary disease, unspecified: Secondary | ICD-10-CM

## 2013-01-31 DIAGNOSIS — I10 Essential (primary) hypertension: Secondary | ICD-10-CM

## 2013-01-31 LAB — LACTIC ACID, PLASMA: Lactic Acid, Venous: 1.9 mmol/L (ref 0.5–2.2)

## 2013-01-31 LAB — HEMOGLOBIN A1C
Hgb A1c MFr Bld: 7.1 % — ABNORMAL HIGH (ref <5.7)
Mean Plasma Glucose: 157 mg/dL — ABNORMAL HIGH (ref <117)

## 2013-01-31 LAB — COMPREHENSIVE METABOLIC PANEL
BUN: 17 mg/dL (ref 6–23)
Calcium: 8.7 mg/dL (ref 8.4–10.5)
Creatinine, Ser: 0.53 mg/dL (ref 0.50–1.35)
GFR calc Af Amer: >90 mL/min (ref >90)
Glucose, Bld: 268 mg/dL — ABNORMAL HIGH (ref 70–99)
Sodium: 137 mEq/L (ref 135–145)
Total Protein: 7.4 g/dL (ref 6.0–8.3)

## 2013-01-31 LAB — GLUCOSE, CAPILLARY
Glucose-Capillary: 217 mg/dL — ABNORMAL HIGH (ref 70–99)
Glucose-Capillary: 212 mg/dL — ABNORMAL HIGH (ref 70–99)
Glucose-Capillary: 323 mg/dL — ABNORMAL HIGH (ref 70–99)

## 2013-01-31 LAB — CBC
HCT: 38.7 % — ABNORMAL LOW (ref 39.0–52.0)
Hemoglobin: 13.2 g/dL (ref 13.0–17.0)
RBC: 4.47 MIL/uL (ref 4.22–5.81)
WBC: 6 10*3/uL (ref 4.0–10.5)

## 2013-01-31 LAB — TROPONIN I
Troponin I: <0.3 ng/mL (ref <0.30)
Troponin I: <0.3 ng/mL (ref <0.30)

## 2013-01-31 LAB — BASIC METABOLIC PANEL
Chloride: 106 mEq/L (ref 96–112)
GFR calc Af Amer: >90 mL/min (ref >90)
GFR calc non Af Amer: >90 mL/min (ref >90)
Potassium: 3.9 mEq/L (ref 3.5–5.1)
Sodium: 140 mEq/L (ref 135–145)

## 2013-01-31 LAB — PRO B NATRIURETIC PEPTIDE: Pro B Natriuretic peptide (BNP): 143.4 pg/mL (ref 0–450)

## 2013-01-31 MED ORDER — ALBUTEROL SULFATE (5 MG/ML) 0.5% IN NEBU
2.5000 mg | INHALATION_SOLUTION | Freq: Three times a day (TID) | RESPIRATORY_TRACT | Status: DC
  Administered 2013-01-31: 2.5 mg via RESPIRATORY_TRACT
  Filled 2013-01-31: qty 0.5

## 2013-01-31 MED ORDER — LEVALBUTEROL TARTRATE 45 MCG/ACT IN AERO
2.0000 | INHALATION_SPRAY | Freq: Three times a day (TID) | RESPIRATORY_TRACT | Status: DC
  Administered 2013-02-01 (×2): 2 via RESPIRATORY_TRACT
  Filled 2013-01-31: qty 15

## 2013-01-31 MED ORDER — SODIUM CHLORIDE 0.9 % IV SOLN
INTRAVENOUS | Status: DC
  Administered 2013-01-31: 06:00:00 via INTRAVENOUS

## 2013-01-31 MED ORDER — GLUCERNA SHAKE PO LIQD
237.0000 mL | ORAL | Status: DC
  Administered 2013-01-31: 237 mL via ORAL

## 2013-01-31 MED ORDER — ALBUTEROL SULFATE (5 MG/ML) 0.5% IN NEBU: 2.5000 mg | INHALATION_SOLUTION | RESPIRATORY_TRACT | Status: DC | PRN

## 2013-01-31 MED ORDER — BISACODYL 5 MG PO TBEC
5.0000 mg | DELAYED_RELEASE_TABLET | Freq: Every day | ORAL | Status: DC
  Administered 2013-01-31: 5 mg via ORAL
  Filled 2013-01-31: qty 1

## 2013-01-31 MED ORDER — INSULIN ASPART 100 UNIT/ML ~~LOC~~ SOLN
5.0000 [IU] | Freq: Once | SUBCUTANEOUS | Status: AC
  Administered 2013-01-31: 5 [IU] via SUBCUTANEOUS

## 2013-01-31 MED ORDER — ALBUTEROL SULFATE (5 MG/ML) 0.5% IN NEBU
2.5000 mg | INHALATION_SOLUTION | Freq: Once | RESPIRATORY_TRACT | Status: AC
  Administered 2013-01-31: 2.5 mg via RESPIRATORY_TRACT

## 2013-01-31 MED ORDER — PREDNISONE 50 MG PO TABS
50.0000 mg | ORAL_TABLET | Freq: Every day | ORAL | Status: DC
  Administered 2013-02-01: 50 mg via ORAL
  Filled 2013-01-31 (×2): qty 1

## 2013-01-31 NOTE — Progress Notes (Signed)
Pt tachypnea rate of 23, tachycardia 134. Discussed with MD on call. MD rounded on pt.  New orders received. Pt c/o being warm, (air conditioner broke through out the hospital, repair being made), coughing.  PRN breathing treatment administered per previous order. Will continue to monitor.

## 2013-01-31 NOTE — Progress Notes (Signed)
Echocardiogram 2D Echocardiogram has been performed.  Nathaniel [name] 01/31/2013, 12:11 PM

## 2013-01-31 NOTE — Progress Notes (Signed)
VSS, NAD.  No coughing at the moment, pt "feels better," verbalized through telephonic interpreter.  Denies complaints, will continue to monitor.

## 2013-01-31 NOTE — ED Provider Notes (Signed)
Medical screening examination/treatment/procedure(s) were performed by non-physician practitioner and as supervising physician I was immediately available for consultation/collaboration.    Lonita Debes R Ermon Sagan, MD 01/31/13 1509 

## 2013-01-31 NOTE — Progress Notes (Signed)
INITIAL NUTRITION ASSESSMENT  DOCUMENTATION CODES Per approved criteria  -Not Applicable   INTERVENTION: Add Glucerna Shake po daily, each supplement provides 220 kcal and 10 grams of protein. RD to continue to follow nutrition care plan.  NUTRITION DIAGNOSIS: Increased nutrient needs related to COPD as evidenced by estimated needs.   Goal: Intake to meet >90% of estimated nutrition needs.  Monitor:  weight trends, lab trends, I/O's, PO intake, supplement tolerance  Reason for Assessment: Malnutrition Screening Tool  79 y.o. male  Admitting Dx: COPD exacerbation  ASSESSMENT: PMHx significant for DM. Admitted for SOB and cough x 3 days. Work-up reveals COPD exacerbation.   Reported on admission that he had not had a BM x 6 days. Nurses state that pt has had multiple bowel movements today. He ate approximately 50% of his breakfast this morning, however for lunch all he consumed was his milk. He states that enjoys Ensure supplements - will add Glucerna given hx of DM and ongoing hyperglycemia.  Pt with steady weight hx x 1 year. Pt is small-framed.  Height: Ht Readings from Last 1 Encounters:  11/06/12 5' (1.524 m)    Weight: Wt Readings from Last 1 Encounters:  01/31/13 108 lb 11 oz (49.3 kg)    Ideal Body Weight: 106 lb  % Ideal Body Weight: 102%  Wt Readings from Last 10 Encounters:  01/31/13 108 lb 11 oz (49.3 kg)  11/10/12 115 lb (52.164 kg)  02/23/12 116 lb 12.8 oz (52.98 kg)  02/07/12 109 lb 8 oz (49.669 kg)  01/03/12 114 lb (51.71 kg)  09/16/11 116 lb (52.617 kg)  06/02/09 102 lb (46.267 kg)  04/30/09 104 lb (47.174 kg)  04/18/09 109 lb (49.442 kg)  10/02/08 113 lb (51.256 kg)    Usual Body Weight: 109 - 114 lb  % Usual Body Weight: 100%  BMI:  Body mass index is 21.23 kg/(m^2). WNL  Estimated Nutritional Needs: Kcal: 1550 - 1700 Protein: 59 - 70 g Fluid: 1.5 - 1.7 liters daily  Skin: intact  Diet Order: Carb Control  EDUCATION  NEEDS: -No education needs identified at this time   Intake/Output Summary (Last 24 hours) at 01/31/13 1459 Last data filed at 01/31/13 1230  Gross per 24 hour  Intake    480 ml  Output   1828 ml  Net  -1348 ml    Last BM: 8/20   Labs:   Recent Labs Lab 01/30/13 1455 01/30/13 2042 01/31/13 0005 01/31/13 0922  NA 142  --  137 140  K 4.2  --  4.2 3.9  CL 103  --  104 106  CO2 24  --  17* 23  BUN 19  --  17 17  CREATININE 0.64 0.59 0.53 0.60  CALCIUM 9.2  --  8.7 9.0  GLUCOSE 221*  --  268* 201*    CBG (last 3)   Recent Labs  01/30/13 2125 01/31/13 0613 01/31/13 1133  GLUCAP 272* 217* 212*    Scheduled Meds: . albuterol  2.5 mg Nebulization Q4H  . azithromycin  500 mg Intravenous Q24H  . bisacodyl  5 mg Oral Daily  . cefTRIAXone (ROCEPHIN)  IV  1 g Intravenous Q24H  . finasteride  5 mg Oral Daily  . gabapentin  200 mg Oral TID  . heparin  5,000 Units Subcutaneous Q8H  . lisinopril  10 mg Oral Daily   And  . hydrochlorothiazide  12.5 mg Oral Daily  . insulin aspart  0-9 Units Subcutaneous TID WC  .  montelukast  10 mg Oral QHS  . pantoprazole  40 mg Oral Daily  . polyethylene glycol  17 g Oral Daily  . [START ON 02/01/2013] predniSONE  50 mg Oral Q breakfast  . simvastatin  20 mg Oral q1800  . sodium chloride  3 mL Intravenous Q12H  . tamsulosin  0.8 mg Oral QPC supper    Continuous Infusions: . sodium chloride 50 mL/hr at 01/31/13 1610    Past Medical History  Diagnosis Date  . Hyperlipidemia   . Hypertension   . Diabetic neuropathy     /H&P 01/30/2013 (01/30/2013)  . Diabetes mellitus   . BPH (benign prostatic hyperplasia)     /H&P 01/30/2013 (01/30/2013)  . Emphysematous cystitis     /H&P 01/30/2013 (01/30/2013)  . COPD (chronic obstructive pulmonary disease)   . Orthopnoea     Past Surgical History  Procedure Laterality Date  . No past surgeries      Jarold Motto MS, RD, LDN Pager: [phone] After-hours pager: [phone]

## 2013-01-31 NOTE — Care Management Note (Unsigned)
    Page 1 of 1   01/31/2013     3:35:25 PM   CARE MANAGEMENT NOTE 01/31/2013  Patient:  LUGENE, BEOUGHER   Account Number:  [id number]  Date Initiated:  01/31/2013  Documentation initiated by:  Dakia Schifano  Subjective/Objective Assessment:   PT ADM ON 01/30/13 WITH POSS COPD EXACERBATION, UTI.  PTA, PT INDEPENDENT, LIVES WITH FAMILY.     Action/Plan:   WILL FOLLOW FOR HOME NEEDS AS PT PROGRESSES.  PAST HX OF HH WITH [facility].   Anticipated DC Date:  02/04/2013   Anticipated DC Plan:  HOME W HOME HEALTH SERVICES      DC Planning Services  CM consult      Choice offered to / List presented to:             Status of service:  In process, will continue to follow Medicare Important Message given?   (If response is "NO", the following Medicare IM given date fields will be blank) Date Medicare IM given:   Date Additional Medicare IM given:    Discharge Disposition:    Per UR Regulation:  Reviewed for med. necessity/level of care/duration of stay  If discussed at Long Length of Stay Meetings, dates discussed:    Comments:

## 2013-01-31 NOTE — Progress Notes (Signed)
Inpatient Diabetes Program Recommendations  AACE/ADA: New Consensus Statement on Inpatient Glycemic Control (2013)  Target Ranges:  Prepandial:   less than 140 mg/dL      Peak postprandial:   less than 180 mg/dL (1-2 hours)      Critically ill patients:  140 - 180 mg/dL   Reason for Visit: Hyperglycemia  Results for Nathaniel [name], Nathaniel [name] (MRN [id number]) as of 01/31/2013 14:29  Ref. Range 01/30/2013 21:25 01/31/2013 06:13 01/31/2013 11:33  Glucose-Capillary Latest Range: 70-99 mg/dL 409 (H) 811 (H) 914 (H)    Inpatient Diabetes Program Recommendations Correction (SSI): Consider ncreasing Novolog to moderate tidwc while on steroids  Note: Will continue to follow.  Thank you. Ailene Ards, RD, LDN, CDE Inpatient Diabetes Coordinator [phone]

## 2013-01-31 NOTE — Progress Notes (Signed)
Family Medicine Teaching Service Daily Progress Note Intern Pager: [phone]  Patient name: Nathaniel [name] Medical record number: [id number] Date of birth: [birth year] Age: 79 y.o. Gender: male  Primary Care Provider: No primary provider on file. Consultants: none  Code Status: full code  Pt Overview and Major Events to Date: 79 yo male presenting with persistent cough and associated chest pain and SOB on exertion and lying flat, possible COPD exacerbation, and metabolic acidosis with a respiratory alkalosis.  01/30/13: had episodes of tachypnea (23 respirations/min) and tachycardia (134 bpm) Due to A/C being out.   Assessment and Plan:  Quindarrius Joplin is a 78 y.o. year old male presenting with a possible COPD exacerbation. PMH is significant for BPH, HTN, Diabetic neuropathy, DM, and emphysematous cystitis.   #Shortness of breath/Cough: 3 day history of SOB even while at rest. Possible COPD exacerbation. PE ruled out with normal D-Dimer level. CXR was clear of any active infection. Legs are not erythematous or edematous. Has history of COPD but no trigger identified. Sat >99% on RA. With history of dyspnea with laying flat, newly diagnosed CHF is also part of the differential.  - cont. duo nebs 2.5 Q4 scheduled, albuterol Q2 PRN  - transitioned to prednisone today for course 5-7 days  - azithromycin (for atypicals)and ceftriaxone (for possible UTI pending culture) for COPD exacerbation coverage  - incentive spirometry  - cont pulse oximetry  - d-dimer negative despite Wells criteria for PE of 4.  - chest pain better today, troponins neg x3.  - Pro-BNP level normal. Echocardiogram ordered to investigate congestive heart failure as a differential.    - CXR grossly negative, but right hemidiaphragm appears oddly shaped compared to the left. Consider repeat CXR if symptoms worsen or do not improve.   # Lactic acidosis, resolved: unclear etiology. Differential includes: sepsis vs metformin vs poor  perfusion.  - metabolic acidosis with resp. Alkalosis on ABG  - LA today was normal at 1.9 - metformin has been held - follow up urine culture, obtain blood culture, although will likely be negative since ceftriaxone given beforehand.    #UTI : Denies frequency, urgency or burning during urination. History of BPH and emphysematous cystitis and urosepsis in May 2014. Urine output during admission has been good.   - UA was cloudy, glucose 250, many bacteria, 15 ketones, and small leukocytes  - Rocephin x1 in ED. Continue rocephin  - Admission in May was treated with keflex. Bacteria on urine culture showed Multiple bacterial morphotypes present, none predominant.  [ ]  F/u urine Cx:  [ ]  f/u CMET, CBC  [ ] f/u urine output   #chest pain, resolved: most likely secondary to coughing. History of Brugada on EKG in May witnessed by EMS. Cardiology was consulted and were not impressed due to in house EKG's being normal.  - troponin negative x 3   #HTN:  - cont home meds Lisinopril/HCTZ   #DM: glucose in urine  - hold home Metformim due to lactic acidosis  - sensitive SSI while hospitalized  - A1C: 7.2 on 09/16/12. Repeat A1C: 7.1  - would consider alternative agent outpatient such as glipizide (though concern with age)  #BPH: Previous admission in May a bladder scan showed a large amount of retained urine. He did not want to have a foley due to cultural differences. He was sent home with home health and instructed to in and out cath regularly. He was also started on Tamsulosin and Finasteride. Urology was consulted. - home meds: Finasteride and  Tamsulosin  [ ]  monitor urine output   # Constipation: no bowel movement in 5 days.  - miralax daily  - oral dulculax BID - suppository in AM if no BM  FEN/GI: Carb modified diet + NS  Prophylaxis: SubQ heparin   Disposition: admitted to telemetry and family medicine teaching service on tele floor for CHF exacerbation   Subjective: [name] was  examined at bedside this morning with a phone interpreter. He states that his cough is better, and that his chest pain is mostly gone. He is worried about his swelling around his ankles and not having had a bowel movement for 6 days. He denies fevers, chest pain, SOB at rest, abdominal pain, nausea or vomiting. Asked about when he could go home.   Objective: Temp:  [97.6 F (36.4 C)-98.2 F (36.8 C)] 97.6 F (36.4 C) (08/20 0413) Pulse Rate:  [92-107] 98 (08/20 0413) Resp:  [16-26] 18 (08/20 0413) BP: (128-169)/(81-113) 128/81 mmHg (08/20 0413) SpO2:  [93 %-100 %] 96 % (08/20 0822) Weight:  [108 lb 11 oz (49.3 kg)-112 lb 3.4 oz (50.9 kg)] 108 lb 11 oz (49.3 kg) (08/20 0340) Physical Exam: General: NAD, AAOx3 Cardiovascular: RRR, no m/r/g Respiratory: mid lung field expiratory wheezes b/l, prolonged expiratory phase, no rubs or rales or ronchi Abdomen: NBS, soft, non tender, non distended, normal to percussion, negative murphys sign GU: negative Lloyd's sign  Extremities: 1+ pitting edema on left lower extremity, no cyanosis or clubbing.  Laboratory:  Recent Labs Lab 01/30/13 1455 01/30/13 2042 01/31/13 0005  WBC 9.7 6.9 6.0  HGB 14.1 13.2 13.2  HCT 42.1 38.5* 38.7*  PLT 154 157 163    Recent Labs Lab 01/30/13 1455 01/30/13 2042 01/31/13 0005  NA 142  --  137  K 4.2  --  4.2  CL 103  --  104  CO2 24  --  17*  BUN 19  --  17  CREATININE 0.64 0.59 0.53  CALCIUM 9.2  --  8.7  PROT  --   --  7.4  BILITOT  --   --  0.3  ALKPHOS  --   --  57  ALT  --   --  19  AST  --   --  20  GLUCOSE 221*  --  268*      Imaging/Diagnostic Tests:  8.19.14 CHEST - 2 VIEW  Comparison: 11/06/2012  Findings: The patient appears to have lost weight since the prior  study.  Heart size and vascularity are normal. Lungs are clear without  infiltrate or effusion. Negative for mass lesion.  IMPRESSION:  No active cardiopulmonary abnormality.  Original Report Authenticated By: Janeece Riggers, M.D.   Gretta Began, Med Student 01/31/2013, 9:37 AM MS-4, [facility] Family Medicine FPTS Intern pager: [phone], text pages welcome  Family Medicine Upper Level Addendum:   I have seen and examined the patient independently, discussed with Student Doctor Judkins, fully reviewed the progress ntoe and agree with it's contents with the additions as noted in blue text. My independent exam is below.   S: Wants to go home. Main concern is no BM in 5 days. Shortness of breath much better. Not complaining of chest pain.   O: BP 163/85  Pulse 114  Temp(Src) 98 F (36.7 C) (Oral)  Resp 18  Wt 108 lb 11 oz (49.3 kg)  BMI 21.23 kg/m2  SpO2 100% Gen: NAD, resting comfortably in bed CV: RRR no mrg  Lungs: diffuse occasional expiratory wheeze  otherwise clear to auscultation Abd: soft/nontender/nondistended/normal bowel sounds  MSK: moves all extremities, no edema, CVA tenderness Skin: warm and dry, no rash  Neuro: grossly normal, moves all extremities, I did not detect unilateral swelling noted by Student Doctor Judkings  A/P:  79 year old male with COPD exacerbation -continue nebulizers q4/q2, transition to prednisone, continue ceftriaxone/azithro -if urine culture negative, will change to doxy or Levaquin -ruled out for MI with troponins negative x 2, pro bnp nml [ ]  f/u Echo  # Constipation -add dulcolax to miralax. Suppository tomorrow if needed  #UTI -pending culture, continue ceftriaxone until culture negative  # Lactic acidosis-resolved. F/u cultures but doubt sepsis. MI now r/o. Hold metformin-leading on differential.   #HTN-continue Lisinopril/HCTZ. moderately elevated today.   # DM-SSI, hold metofrmin as above  # BPH-tamulosin and finasteride continued   Tana Conch, MD, PGY-3 01/31/2013 2:08 PM

## 2013-01-31 NOTE — Progress Notes (Signed)
FMTS Attending Admission Note: Nathaniel Falconi,MD I  have seen and examined this patient, reviewed their chart. I have discussed this patient with the resident. I agree with the resident's findings, assessment and care plan.  

## 2013-01-31 NOTE — H&P (Addendum)
FMTS Attending Admission Note: Nathaniel Eniola,MD I  have seen and examined this patient, reviewed their chart. I have discussed this patient with the resident. I agree with the resident's findings, assessment and care plan.  A pleasant 79 y/o [place] male presented to the hospital with few days h of worsening SOB and cough. He does not speak english,communication was by the telephone translation.Cough was productive of whitish sputum,not blood stained,he denies fever,no sick contact,he uses inhaler at home for his COPD with no improvement,hence presented to the ER. He feels much better now,breathing without difficulty.  Filed Vitals:   01/31/13 0136 01/31/13 0340 01/31/13 0413 01/31/13 0822  BP:   128/81   Pulse:   98   Temp:   97.6 F (36.4 C)   TempSrc:   Oral   Resp: 17  18   Weight:  108 lb 11 oz (49.3 kg)    SpO2:   97% 96%   Exam: Gen: Awake and alert,not in distress, oriented x3. HEENT: PERRLA,EOMI. Resp: Not in respiratory distress.Not on O2.Air entry equal B/L no wheezing,no crepitations. Heart: S1 S2 normal,no murmurs. Abd: Benign. Ext: No edema.  A/P; 1. Dypnea: COPD exacerbation. Chest xray reviewed,no acute pathology.    Albuterol +Atrovent prn.    I agree with IV solumedrol for 24 hrs then switch to PO.     O2 monitoring and O2 via [place] as needed.  2. Lactic acidosis: Etiology unclear, metformin induced?     I agree with holding Metformin for now.     Sepsis unlikely as patient looks well,blood and urine culture however pending.    A/B coverage for COPD and presumed UTI.  3. UTI: UA showed small Leukocyte which is a better result from 2 months ago.     Urine culture obtained.     A/B coverage while awaiting urine culture report.  4. DM/HTN: Hold Metformin for DM,agree with insulin sliding scale     Continue home BP med and monitor vital signs.

## 2013-02-01 ENCOUNTER — Telehealth: Payer: Self-pay | Admitting: Family Medicine

## 2013-02-01 LAB — URINE CULTURE: Colony Count: >=100000

## 2013-02-01 LAB — GLUCOSE, CAPILLARY

## 2013-02-01 MED ORDER — PREDNISONE 50 MG PO TABS: 50.0000 mg | ORAL_TABLET | Freq: Every day | ORAL | Status: DC

## 2013-02-01 MED ORDER — NITROFURANTOIN MONOHYD MACRO 100 MG PO CAPS: 100.0000 mg | ORAL_CAPSULE | Freq: Two times a day (BID) | ORAL | Status: DC

## 2013-02-01 MED ORDER — NITROFURANTOIN MONOHYD MACRO 100 MG PO CAPS
100.0000 mg | ORAL_CAPSULE | Freq: Two times a day (BID) | ORAL | Status: DC
  Administered 2013-02-01: 100 mg via ORAL
  Filled 2013-02-01 (×3): qty 1

## 2013-02-01 MED ORDER — AZITHROMYCIN 500 MG PO TABS
500.0000 mg | ORAL_TABLET | Freq: Every day | ORAL | Status: DC
  Administered 2013-02-01: 500 mg via ORAL
  Filled 2013-02-01: qty 1

## 2013-02-01 NOTE — Telephone Encounter (Signed)
error 

## 2013-02-01 NOTE — Progress Notes (Signed)
FMTS Attending Admission Note: Darbi Chandran,MD I  have seen and examined this patient, reviewed their chart. I have discussed this patient with the resident. I agree with the resident's findings, assessment and care plan.  Patient in good spirit,denies any complaints today,feeling better. UTI: Urine culture grew enterococcus ,per sensitivity to  Transition to PO Nitrofurantoin. DM not very well controlled but can allow A1C about 7 in geriatric patient. May continue home medication with subsequent Glucose controlled to be managed by his PCP at follow up.

## 2013-02-01 NOTE — Discharge Summary (Signed)
Family Medicine Teaching [facility] Discharge Summary  Patient name: Nathaniel [name] Medical record number: [id number] Date of birth: 1934-04-02 Age: 79 y.o. Gender: male Date of Admission: 01/30/2013  Date of Discharge: 02/01/13 Admitting Physician: Janit Pagan, MD  Primary Care Provider: No primary provider on file. Consultants: none   Indication for Hospitalization: Asthma/COPD exacerbation with associated chest pain.   Discharge Diagnoses/Problem List:  1. Shortness of breath/cough, improved 2. Lactic Acidosis, resolved 3. UTI, improving.  4. Chest pain, resolved 5. HTN, stable 6. Diabetes mellitus type II, stable  7. BPH, stable 8. Constipation, resolved.    Disposition: Home  Discharge Condition: Stable  Discharge Exam:   BP 135/80  Pulse 114  Temp(Src) 97.7 F (36.5 C) (Oral)  Resp 24  Wt 110 lb 0.2 oz (49.9 kg)  BMI 21.48 kg/m2  SpO2 98% General: NAD, resting in bed, slightly sweaty, temp in room was 78 degrees (more comfortable with air-conditioning)  Cardiovascular: RRR, no murmur appreciated Respiratory: CTAB, no wheezes Abdomen: BS+, nontender, soft Extremities: No cyanosis, clubbing, edema, pulses peripherally 2+ throughout.   Brief Hospital Course: Nathaniel [name] is a very pleasant 79 yo [place] (speaks a Counselling psychologist dialect). He presented with a 3-4 day history of worsening dry cough and shortness of breath, and associated swelling in his legs and centralized non-radiating chest pain. SOB not relieved at [facility] care with breathing treatments. His PMH is significant for BPH, HTN, Diabetes mellitus type II, diabetic neuropathy, and emphysematous cystitis. See below for details by problem list.  1. Shortness of breath/cough, improved; treated as COPD/asthma exacerbation - patient received breathing treatments immediately upon ED arrival and throughout admission - CXR negative for consolidation - D-Dimer WNL - cough diminished with treatments, never  productive of phlegm or blood.  - chest pain resolved completely with treatment of cough.  - discharged on Spiriva , Singulair 10mg , Advair 500-50, and Albuterol medications for asthma/COPD  - was treated with Azithromycin 500 mg for 3 days for possible pneumonia during hospitalization - also received CTX empirically, discontinued after day 2 - continue on oral prednisone 50 mg 1 tab daily through 02/03/13 to complete 5 days total  2. Lactic acidosis, resolved - LA was 6.2 on admission, and rose to 6.6, before falling to 1.9 (WNL) on day 2 of admission. - most likely metabolic acidosis related to complicated UTI infection; metformin was held in case it was contributing.   3. UTI, improving - On admission, UA was suspicious for UTI with many bacteria and small leukocytes and a cloudy appearance. Was started on ceftriaxone for empiric coverage of E. Coli.  - Urine culture later showed growth of Enterococcus, so ceftriaxone was stopped and Nitrofurantoin was started.  - patient never was symptomatic, but he has had a history of emphysematous cystitis in the past - pt will complete 10 days total of antibiotics with Nitrofurantoin  4. Chest pain, resolved - in ED, described pain as localized to left and central chest non-radiating.  - Troponins were negative x 3 - EKG was not suggestive of ischemia - Pain resolved within 24 hours of admission with improvement of cough and SOB  5. HTN, stable - BP ranged from 123-163 / 81-92, on day of discharge was 133/81 - will be discharged on home HCTZ-Lisinopril   6. Diabetes mellitus type II, controlled (A1c 7.1) - CBG ranged from 116 - 323 mg/dL - Continue home medication metformin 500mg  BID - Continue Neurontin 200 mg TID  7. BPH, stable - good urine  output throughout hospital stay, no issues with urinary retention - continue on Flomax and Proscar home meds.   8. Constipation, resolved - treated with Miralax and Dulcolax in hospital - Had  not had a bowel movement in 5-6 days before having a BM on the morning of 8/21.   Issues for Follow Up:  Acute UTI - Would recommend considering f/u UA/culture depending on any symptoms. Encourage completion of abx. Chronic illness - pt would benefit from counseling on diabetes, COPD/asthma, and HTN management (concern for compliance with medications at home).  Significant Procedures: none  Significant Labs and Imaging:   Recent Labs Lab 01/30/13 1455 01/30/13 2042 01/31/13 0005  WBC 9.7 6.9 6.0  HGB 14.1 13.2 13.2  HCT 42.1 38.5* 38.7*  PLT 154 157 163    Recent Labs Lab 01/30/13 1455 01/30/13 2042 01/31/13 0005 01/31/13 0922  NA 142  --  137 140  K 4.2  --  4.2 3.9  CL 103  --  104 106  CO2 24  --  17* 23  GLUCOSE 221*  --  268* 201*  BUN 19  --  17 17  CREATININE 0.64 0.59 0.53 0.60  CALCIUM 9.2  --  8.7 9.0  ALKPHOS  --   --  57  --   AST  --   --  20  --   ALT  --   --  19  --   ALBUMIN  --   --  3.9  --    Lactic Acid 6.2 on admission, increased to 6.6, fell to 1.9 WNL  01/30/13 CHEST - 2 VIEW  Comparison: 11/06/2012  Findings: The patient appears to have lost weight since the prior  study.  Heart size and vascularity are normal. Lungs are clear without  infiltrate or effusion. Negative for mass lesion.  IMPRESSION:  No active cardiopulmonary abnormality.  Original Report Authenticated By: Janeece Riggers, M.D.  Outstanding Results: Final blood culture results   Discharge Medications:    Medication List         finasteride 5 MG tablet  Commonly known as:  PROSCAR  Take 5 mg by mouth daily.     Fluticasone-Salmeterol 500-50 MCG/DOSE Aepb  Commonly known as:  ADVAIR  Inhale 1 puff into the lungs 2 (two) times daily.     gabapentin 100 MG capsule  Commonly known as:  NEURONTIN  Take 200 mg by mouth 3 (three) times daily.     lisinopril-hydrochlorothiazide 10-12.5 MG per tablet  Commonly known as:  PRINZIDE,ZESTORETIC  Take 1 tablet by mouth  daily.     metFORMIN 500 MG tablet  Commonly known as:  GLUCOPHAGE  Take 1 tablet (500 mg total) by mouth 2 (two) times daily with a meal. 1 tablet by mouth twice daily for diabetes     metroNIDAZOLE 500 MG tablet  Commonly known as:  FLAGYL  Take 500 mg by mouth every 8 (eight) hours.     montelukast 10 MG tablet  Commonly known as:  SINGULAIR  Take 10 mg by mouth at bedtime.     nitrofurantoin (macrocrystal-monohydrate) 100 MG capsule  Commonly known as:  MACROBID  Take 1 capsule (100 mg total) by mouth 2 (two) times daily. Take 1 tablet 8/21 in the evening, then twice a day for 9 more days.     omeprazole 20 MG capsule  Commonly known as:  PRILOSEC  Take 1 capsule (20 mg total) by mouth daily.     pravastatin 40 MG  tablet  Commonly known as:  PRAVACHOL  Take 1 tablet (40 mg total) by mouth daily.     predniSONE 50 MG tablet  Commonly known as:  DELTASONE  Take 1 tablet (50 mg total) by mouth daily. Take 1 tablet 8/22 and 1 tablet 8/23.     tamsulosin 0.4 MG Caps capsule  Commonly known as:  FLOMAX  Take 0.8 mg by mouth daily after supper.     tiotropium 18 MCG inhalation capsule  Commonly known as:  SPIRIVA  Place 1 capsule (18 mcg total) into inhaler and inhale daily. 1 cap inhaled daily     VENTOLIN HFA 108 (90 BASE) MCG/ACT inhaler  Generic drug:  albuterol  Inhale 1 puff into the lungs 4 (four) times daily. Inhale 1-2 puffs every 4 hours as needed for breathing difficulties        Discharge Instructions: Please refer to Patient Instructions section of EMR for full details.  Patient was counseled important signs and symptoms that should prompt return to medical care, changes in medications, dietary instructions, activity restrictions, and follow up appointments.   Follow-Up Appointments: Follow-up Information   [facility]. Schedule an appointment as soon as possible for a visit in 1 week.   Specialty:  Family Medicine   Contact  information:   [address] [place] [address] [phone]      Gretta Began, Med Student 02/01/2013, 2:06 PM MS-4, [facility]  FPTS Upper Level Resident Addendum  I independently interviewed and examined this patient. His discharge planning as outlined above was discussed and arranged by the FPTS team as a whole, including attending physician Dr. Lum Babe. I have discussed the above with Nathaniel [name] and agree with his documentation as above. The above reflects his original note with my edits for correction/additions/clarification/emphasis.  Bobbye Morton, MD PGY-2, [facility] 02/01/2013, 9:02 PM

## 2013-02-01 NOTE — Progress Notes (Signed)
PT DISCHARGE HOME. REVIEWED DISCHARGE INSTRUCTIONS WITH PT DAUGHTER, "HOLLI". EXPLAINED TO DAUGHTER PRESCRIPTIONS ARE AT PHARMACY. DAUGHTER VU. PT WITHOUT COMPLICATIONS. PT LEFT UNIT VIA WC WITH DAUGHTER.

## 2013-02-01 NOTE — Progress Notes (Signed)
Family Medicine Teaching Service Daily Progress Note Intern Pager: [phone]  Patient name: Nathaniel [name] Medical record number: [id number] Date of birth: 1934-03-15 Age: 79 y.o. Gender: male  Primary Care Provider: No primary provider on file. Consultants: None Code Status: Full Code  Pt Overview and Major Events to Date: Enterococcus on UC. Fevers/Chills throughout night.   Assessment and Plan:  Nathaniel [name] is a 79 y.o. year old male presenting with a possible COPD exacerbation. PMH is significant for BPH, HTN, Diabetic neuropathy, DM, and emphysematous cystitis.   #Shortness of breath/Cough, improved: Coughing less, improved with breathing treatments. - cont. duo nebs 2.5 Q4 scheduled, albuterol Q2 PRN  - transitioned to prednisone today for course 5-7 days  - stopped Ceftriaxone continue Azithromycin - incentive spirometry  - cont pulse oximetry  - chest pain resolved.   - Pro-BNP level normal. Echocardiogram normal, no wall movement abnormalities, EF: 55-60% [ ]  d/c home with Albuterol inhaler, 79 singulair 10mg , advair 500-61mcg inhaler  # Lactic acidosis, resolved: unclear etiology. Differential includes: sepsis vs metformin vs poor perfusion.  - metabolic acidosis with resp. Alkalosis on ABG  - metformin was held during hospitalization, but Lactic acidosis was most likely from enterococcal urinary tract infection.     #UTI : Denies frequency, urgency or burning during urination. History of BPH and emphysematous cystitis and urosepsis in May 2014. Urine output during admission has been good.  - UA was cloudy, glucose 250, many bacteria, 15 ketones, and small leukocytes  - Rocephin discontinued, started on Nitrofuranatoin 100mg  q12h x 10d for complicated (male patient) UTI, UC showed Enterococcus.    #chest pain, resolved: most likely secondary to coughing.  - troponin negative x 3   #HTN:  - cont home meds Lisinopril/HCTZ   #DM:  -held metformin d/t lactic acidosis. Source of  lactic acidosis was most likely from UTI, so we will restart patient on home medications - would consider alternative agent outpatient such as glipizide (though concern with age)   #BPH: Previous admission in May a bladder scan showed a large amount of retained urine. He did not want to have a foley due to cultural differences. He was sent home with home health and instructed to in and out cath regularly. He was also started on Tamsulosin and Finasteride. Urology was consulted.  - home meds: Finasteride and Tamsulosin  [ ]  monitor urine output   # Constipation: no bowel movement in 5 days.  - miralax daily  - oral dulculax BID  - BM this morning, of normal consistency according to patient.   FEN/GI: Carb modified diet + NS  Prophylaxis: SubQ heparin   Disposition: Discharge home  Subjective: Nathaniel [name] is doing well this morning. He has had episodes of fevers and chills over night, but felt better as we were talking to him. He states his cough continues to improve, and that the swelling in his legs has improved as well. He denies any further chest pain. He denies headache, dizziness, or nausea. He also denies any shortness of breath.   Objective: Temp:  [97.8 F (36.6 C)-98 F (36.7 C)] 97.8 F (36.6 C) (08/21 0454) Pulse Rate:  [86-119] 98 (08/21 0454) Resp:  [18-19] 18 (08/21 0454) BP: (133-163)/(81-85) 133/81 mmHg (08/21 0454) SpO2:  [94 %-100 %] 98 % (08/21 0454) Weight:  [49.9 kg (110 lb 0.2 oz)] 49.9 kg (110 lb 0.2 oz) (08/21 0454) Physical Exam: General: NAD, resting in bed, slightly sweaty, temp in room was 78 degrees.  Cardiovascular: RRR, no  m/r/g Respiratory: CTAB, no wheezes, rales, ronchi Abdomen: NBS, NT/ND Extremities: No cyanosis, clubbing, edema, pulses peripherally 2+ throughout.   Laboratory:  Recent Labs Lab 01/30/13 1455 01/30/13 2042 01/31/13 0005  WBC 9.7 6.9 6.0  HGB 14.1 13.2 13.2  HCT 42.1 38.5* 38.7*  PLT 154 157 163    Recent Labs Lab  01/30/13 1455 01/30/13 2042 01/31/13 0005 01/31/13 0922  NA 142  --  137 140  K 4.2  --  4.2 3.9  CL 103  --  104 106  CO2 24  --  17* 23  BUN 19  --  17 17  CREATININE 0.64 0.59 0.53 0.60  CALCIUM 9.2  --  8.7 9.0  PROT  --   --  7.4  --   BILITOT  --   --  0.3  --   ALKPHOS  --   --  57  --   ALT  --   --  19  --   AST  --   --  20  --   GLUCOSE 221*  --  268* 201*    UCx: Enterococcus species, >100,000 colonies/ml  Imaging/Diagnostic Tests: No new in 24 hours  Nathaniel [name], Med Student 02/01/2013, 9:46 AM MS-4, [facility] FPTS Intern pager: [phone], text pages welcome  FPTS Upper Level Addendum  S: Interviewed with interpretor phone. Doing much better today, some subjective "fever" overnight (complained of "being hot," but felt better with turning down the air conditioning).  O: I have reviewed the patient's current medications, labs/imaging, and diagnostic tests. BP 133/81  Pulse 98  Temp(Src) 97.8 F (36.6 C) (Oral)  Resp 18  Wt 110 lb 0.2 oz (49.9 kg)  BMI 21.48 kg/m2  SpO2 98% Gen: adult male in NAD, pleasant and conversant CV: RRR, no murmur appreciated Lungs: CTAB with occasional very faint wheeze, much improved from previous exams  Abd: soft, nontender, BS+ Skin: warm and dry, no rash  Neuro: no gross focal deficit, moves all extremities equally  A/P: 79yo [place] male, admitted with SOB now much improved, found to have UTI with Enterococcus. Vitals stable.  #Asthma/COPD exacerbation - much improved on inhalers, less tachycardia with Xopenex -continue prednisone for burst -home Spiriva -d/c ceftriaxone, transition azithromycin to PO -other work-up generally negative  # UTI - culture shows Enterococcus - abx to Macrobid for 10 days total  # Constipation - has had BM, continue PRN Miralax/bisacodyl  # HTN - home lisinopril/HCTZ # HLD - continue Zocor for home Pravachol # DM - restart metformin at discharge  -controlled at  home (A1c 7.1 on 8/19)  -recent elevations in CBG's (200's-300's) likely due to prednisone burst # GERD - home PPI  # BPH - home tamulosin and finasteride  #FEN/GI: Glucerna with carb modified diet, NS 50 mL/h #Prophylaxis: heparin subQ  Dispo: discharge home today  I have independently interviewed and examined this patient in conjunction with Mr. Blondell Reveal, whose note is documented above with any additions/clarifications/edits in orange. The above addendum reflects my independent interview, exam, and assessment/plan. Please see also attending notes.   Bobbye Morton, MD  PGY-2, [facility]  02/01/2013, 12:18 PM FPTS Service Pager: [phone] (text pages welcome through [employer], password "[email or web address]")

## 2013-02-01 NOTE — Clinical Documentation Improvement (Signed)
THIS DOCUMENT IS NOT A PERMANENT PART OF THE MEDICAL RECORD  Please update your documentation with the medical record to reflect your response to this query. If you need help knowing how to do this please call [phone].  02/01/13   Dear Dr.Eniola/Associates,  In a better effort to capture your patient's severity of illness, reflect appropriate length of stay and utilization of resources, a review of the patient medical record has revealed the following indicators.    Based on your clinical judgment, please clarify and document in a progress note and/or discharge summary the clinical condition associated with the following supporting information:  Please clarify and specify Diabetes control, manifestations, and associated conditions.  Possible Clinical Conditions?   __XXX__Controlled (A1c 7.1)  Manifestations:  _______DM retinopathy  _______DM PVD _______DM neuropathy   _______DM nephropathy  Associated conditions: _______DM cellulitis _______DM gangrene _______DM gastroparesis _______DM hyperosmolarity state _______DM ketoacidosis with or without coma _______DM osteomyelitis _______DM skin ulcer  _______Other Condition _______Cannot Clinically determine     Risk Factors: Noted patient with a history of diabetes mellitus, type II per H&P.   Lab: Hgb A1c level: 8/19:  7.1  Mean plasma glucose: 8/19:  157  Glucose, capillary: 8/19:  272 8/20:  323  Treatment: novolog insulin 0-9 units sq 3x/day with meals.   You may use possible, probable, or suspect with inpatient documentation. possible, probable, suspected diagnoses MUST be documented at the time of discharge  Reviewed: additional documentation in the medical record -see my progress note 8/21 (addendum section at bottom)  Bobbye Morton, MD PGY-2, [facility] 02/01/2013, 12:44 PM FPTS Service pager: [phone] (text pages welcome through [facility])

## 2013-02-02 NOTE — Discharge Summary (Signed)
FMTS Attending Admission Note: Marycarmen Hagey,MD I  have seen and examined this patient, reviewed their chart. I have discussed this patient with the resident. I agree with the resident's findings, assessment and care plan.  

## 2013-02-06 LAB — CULTURE, BLOOD (ROUTINE X 2)
Culture: NO GROWTH
Culture: NO GROWTH

## 2013-02-08 ENCOUNTER — Other Ambulatory Visit: Payer: Self-pay | Admitting: Family Medicine

## 2013-02-09 ENCOUNTER — Ambulatory Visit (INDEPENDENT_AMBULATORY_CARE_PROVIDER_SITE_OTHER): Payer: Medicare Other | Admitting: Emergency Medicine

## 2013-02-09 ENCOUNTER — Telehealth: Payer: Self-pay | Admitting: Family Medicine

## 2013-02-09 VITALS — BP 150/100 | HR 76 | Temp 97.5°F | Resp 18 | Ht 62.0 in | Wt 105.0 lb

## 2013-02-09 DIAGNOSIS — J449 Chronic obstructive pulmonary disease, unspecified: Secondary | ICD-10-CM

## 2013-02-09 DIAGNOSIS — J4489 Other specified chronic obstructive pulmonary disease: Secondary | ICD-10-CM

## 2013-02-09 DIAGNOSIS — E119 Type 2 diabetes mellitus without complications: Secondary | ICD-10-CM

## 2013-02-09 DIAGNOSIS — R05 Cough: Secondary | ICD-10-CM

## 2013-02-09 DIAGNOSIS — R0602 Shortness of breath: Secondary | ICD-10-CM

## 2013-02-09 DIAGNOSIS — R059 Cough, unspecified: Secondary | ICD-10-CM

## 2013-02-09 MED ORDER — ALBUTEROL SULFATE HFA 108 (90 BASE) MCG/ACT IN AERS: 2.0000 | INHALATION_SPRAY | RESPIRATORY_TRACT | Status: AC

## 2013-02-09 MED ORDER — ALBUTEROL SULFATE (2.5 MG/3ML) 0.083% IN NEBU
2.5000 mg | INHALATION_SOLUTION | Freq: Once | RESPIRATORY_TRACT | Status: AC
  Administered 2013-02-09: 2.5 mg via RESPIRATORY_TRACT

## 2013-02-09 MED ORDER — FLUTICASONE-SALMETEROL 500-50 MCG/DOSE IN AEPB: 1.0000 | INHALATION_SPRAY | Freq: Two times a day (BID) | RESPIRATORY_TRACT | Status: AC

## 2013-02-09 MED ORDER — IPRATROPIUM BROMIDE 0.02 % IN SOLN
0.5000 mg | Freq: Once | RESPIRATORY_TRACT | Status: AC
  Administered 2013-02-09: 0.5 mg via RESPIRATORY_TRACT

## 2013-02-09 MED ORDER — FREESTYLE LITE DEVI: 1.0000 | Status: DC

## 2013-02-09 NOTE — Progress Notes (Signed)
[facility] [address], [place] [address] [phone]  Date:  02/09/2013   Name:  Nathaniel [name]   DOB:  Nov 06, 1933   MRN:  [id number]  PCP:  Rockne Coons, DO    Chief Complaint: Cough and Medication Refill   History of Present Illness:  Nathaniel [name] is a 79 y.o. very pleasant male patient who presents with the following:  Seen in the office and transferred to the hospital by EMS for exacerbation of shortness of breath due to his underlying asthma/COPD.  He was discharged a week ago improved.  Since his discharge, he has not used his inhalers as they were exhausted.  He is now short of breath.  Has an occasional non productive cough.  No chest pain, tightness or heaviness.  No fever or chills.  No nausea or vomiting and no stool change.  Eating well and weight is stable.  Short of breath at rest.  No improvement with over the counter medications or other home remedies. Denies other complaint or health concern today.   Patient Active Problem List   Diagnosis Date Noted  . 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 GERD (gastroesophageal reflux disease) 01/03/2012  . HTN (hypertension) 09/16/2011  . Hyperlipidemia 09/16/2011  . Diabetes mellitus 09/16/2011  . Neuropathy 09/16/2011  . PULMONARY NODULE 10/02/2008  . Essential hypertension, benign 01/10/2008  . DIABETES MELLITUS 12/14/2007  . HYPERTHYROIDISM, SUBCLINICAL 11/09/2007  . COPD 11/09/2007  . INSOMNIA 11/09/2007    Past Medical History  Diagnosis Date  . Hyperlipidemia   . Hypertension   . Diabetic neuropathy     /H&P 01/30/2013 (01/30/2013)  . Diabetes mellitus   . BPH (benign prostatic hyperplasia)     /H&P 01/30/2013 (01/30/2013)  . Emphysematous cystitis     /H&P 01/30/2013 (01/30/2013)  . COPD (chronic obstructive pulmonary disease)   . Orthopnoea     Past Surgical History  Procedure Laterality Date  . No past surgeries      History  Substance Use Topics  . Smoking status: Former Smoker --  0.25 packs/day for 42 years    Types: Cigarettes  . Smokeless tobacco: Never Used     Comment: 01/30/2013 "quit smoking ~ 10 yr ago"  . Alcohol Use: No    History reviewed. No pertinent family history.  No Known Allergies  Medication list has been reviewed and updated.  Current Outpatient Prescriptions on File Prior to Visit  Medication Sig Dispense Refill  . albuterol (VENTOLIN HFA) 108 (90 BASE) MCG/ACT inhaler Inhale 1 puff into the lungs 4 (four) times daily. Inhale 1-2 puffs every 4 hours as needed for breathing difficulties      . Fluticasone-Salmeterol (ADVAIR) 500-50 MCG/DOSE AEPB Inhale 1 puff into the lungs 2 (two) times daily.  60 each  11  . tiotropium (SPIRIVA) 18 MCG inhalation capsule Place 1 capsule (18 mcg total) into inhaler and inhale daily. 1 cap inhaled daily  30 capsule  11  . finasteride (PROSCAR) 5 MG tablet Take 5 mg by mouth daily.      Marland Kitchen gabapentin (NEURONTIN) 100 MG capsule Take 200 mg by mouth 3 (three) times daily.      Marland Kitchen lisinopril-hydrochlorothiazide (PRINZIDE,ZESTORETIC) 10-12.5 MG per tablet Take 1 tablet by mouth daily.      . metFORMIN (GLUCOPHAGE) 500 MG tablet Take 1 tablet (500 mg total) by mouth 2 (two) times daily with a meal. 1 tablet by mouth twice daily for diabetes  60 tablet  5  .  metroNIDAZOLE (FLAGYL) 500 MG tablet Take 500 mg by mouth every 8 (eight) hours.      . montelukast (SINGULAIR) 10 MG tablet Take 10 mg by mouth at bedtime.      . nitrofurantoin, macrocrystal-monohydrate, (MACROBID) 100 MG capsule Take 1 capsule (100 mg total) by mouth 2 (two) times daily. Take 1 tablet 8/21 in the evening, then twice a day for 9 more days.  19 capsule  0  . omeprazole (PRILOSEC) 20 MG capsule Take 1 capsule (20 mg total) by mouth daily.  30 capsule  6  . pravastatin (PRAVACHOL) 40 MG tablet Take 1 tablet (40 mg total) by mouth daily.  30 tablet  5  . predniSONE (DELTASONE) 50 MG tablet Take 1 tablet (50 mg total) by mouth daily. Take 1 tablet 8/22  and 1 tablet 8/23.  2 tablet  0  . tamsulosin (FLOMAX) 0.4 MG CAPS capsule Take 0.8 mg by mouth daily after supper.       No current facility-administered medications on file prior to visit.    Review of Systems:  As per HPI, otherwise negative.    Physical Examination: Filed Vitals:   02/09/13 1120  BP: 150/100  Pulse: 76  Temp: 97.5 F (36.4 C)  Resp: 18   Filed Vitals:   02/09/13 1120  Height: 5\' 2"  (1.575 m)  Weight: 105 lb (47.628 kg)   Body mass index is 19.2 kg/(m^2). Ideal Body Weight: Weight in (lb) to have BMI = 25: 136.4  GEN: WDWN, NAD, Non-toxic, A & O x 3  Visibly short of breath at rest HEENT: Atraumatic, Normocephalic. Neck supple. No masses, No LAD. Ears and Nose: No external deformity. CV: RRR, No M/G/R. No JVD. No thrill. No extra heart sounds. PULM: CTA B, very poor air movement with faint diffuse wheezes, no crackles, rhonchi. No retractions. . No accessory muscle use. ABD: S, NT, ND, +BS. No rebound. No HSM. EXTR: No c/c/e NEURO Normal gait.  PSYCH: Normally interactive. Conversant. Not depressed or anxious appearing.  Calm demeanor.    Assessment and Plan: Exacerbation COPD Refill meds Improved with neb   Signed,  Phillips Odor, MD

## 2013-02-09 NOTE — Telephone Encounter (Signed)
Dr Wetzel [address]  I got a message from Dr Ewell Poe office that Nathaniel [name] was out of his Albuterol since d/c from the hospital and no refill was given by the FMTS team upon discharge,now he is at the clinic in respiratory distress which improved after neb treatment as per documentation, discharge summary from last visit however included Albuterol as part of his discharged medications, as mentioned to Dr Unice Cobble might be due to poor communication due to language barrier,patient does not speak english.  Dr Lorenda Peck always review the intern and medical student discharge medication and ensure script are given to patient who need refills. Again I believe this happened due to language barrier. If you have any question or concern,please do not hesitate to contact me.  Thanks.

## 2013-02-09 NOTE — Telephone Encounter (Addendum)
Appears medication was noted as resumed upon discharge. Unclear if we were knowledgeable that patient was out as this was a home medication. No notes on H+P or from [place] on day of admission that patient had run out of his medication. Agree this may have been a communication issue given language barrier both on our end on discharge and from PCP as chronic medications are refilled through PCP. I believe we should all strive to provide the best care for our patients and I appreciate this feedback. I agree that all medications should be reviewed for refills especially for the acute admission on discharge.

## 2013-02-09 NOTE — Patient Instructions (Addendum)
B?nh T?c Ngh?n Ph?i Mn Tnh  (Chronic Obstructive Pulmonary Disease)  B?nh t?c ngh?n ph?i mn tnh (COPD) l tnh tr?ng m trong ? dng kh t? ph?i b? h?n ch?. Ph?i c th? khng bao gi? tr? l?i bnh th??ng, nh?ng c nh?ng bi?n php c th? s? c?i thi?n chng v lm cho b?n c?m th?y t?t h?n. NGUYN NHN   Ht thu?c l.  Ti?p xc v?i khi thu?c l th? ??ng.  Ht ph?i ch?t kch thch, ch?ng h?n nh?  nhi?m khng kh, b?i, khi thu?c l, mi m?nh, bnh phun x?t ho?c h?i s?n.  Ti?n s? nhi?m trng ph?i. TRI?U CH?NG   Ho su, ko di (mn tnh) v?i m?t l??ng l?n d?ch nh?y ??c.  Th? kh kh.  Th? d?c, ??c bi?t khi tham gia vo ho?t ??ng th? ch?t.  C?m gic nh? b?n khng th? c ?? khng kh.  Kh th?.  Th? nhanh.  Da ??i mu xm ho?c h?i xanh (xanh tnh), ??c bi?t ? cc ngn tay, ngn chn ho?c mi.  M?t m?i.  Gi?m cn.  S?ng ? chn, m?t c chn ho?c bn chn.  Nh?p tim nhanh.  Th??ng xuyn b? nhi?m trng ph?i.   T?c ng?c. CH?N ?ON  Ch?n ?on ban ??u c th? d?a vo b?nh s?, tri?u ch?ng c?a b?n v khm tr?c ti?p. Xt nghi?m b? sung cho COPD c th? bao g?m:   Ch?p X quang ng?c.  Ch?p c?t l?p vi tnh (CT).  Ki?m tra ch?c n?ng ph?i.  Xt nghi?m mu. ?I?U TR?  ?i?u tr? t?p trung vo vi?c lm cho b?n tho?i mi (ch?m [place] h? tr?). Chuyn gia ch?m [place] y t? c th? k thu?c (ht ho?c thu?c vin) gip c?i thi?n kh? n?ng th? c?a b?n. L?a ch?n ?i?u tr? b? sung c th? bao g?m li?u php oxy v ph?c h?i ch?c n?ng ph?i. ?i?u tr? c?ng s? bao g?m vi?c gi?m ti?p xc v?i ch?t kch thch ? bi?t v tun th? k? ho?ch b? thu?c.  H??NG D?N CH?M [place] T?I NH   S? d?ng t?t c? thu?c bao g?m c? thu?c khng sinh theo ch? d?n c?a chuyn gia ch?m [place] y t?.  S? d?ng thu?c ht theo ch? d?n c?a chuyn gia ch?m [place] y t?.  Trnh s? d?ng thu?c ho?c xi-r ho lm kh ???ng h h?p c?a b?n (thu?c khng hixtamin) v lm ch?m vi?c lo?i b? cc ch?t bi ti?t. ?i?u ny lm gi?m kh? n?ng h h?p v c th? d?n ??n  nhi?m trng.  N?u b?n ht thu?c, hy b? ht thu?c.  Trnh ti?p xc v?i khi thu?c, ha ch?t v khi lm tr?m tr?ng thm h?i th? c?a b?n.  Trnh ti?p xc v?i nh?ng ng??i c b?nh truy?n nhi?m.  [place] thay ??i nhi?t ?? v ?? ?m kh?c nghi?t.  S? d?ng my t?o ?? ?m ? nh v ? c?nh gi??ng ng? c?a b?n n?u chng khng gy kh th?.  U?ng ?? n??c v dung d?ch ?? n??c ti?u trong ho?c c mu vng nh?t. ?i?u ny lm l?ng ch?t bi ti?t.  ?n th?c ph?m c l?i cho s?c kh?e. ?n nhi?u b?a nh? h?n, th??ng xuyn h?n v ngh? ng?i tr??c khi ?n c th? gip b?n duy tr s?c m?nh c?a mnh.  H?i chuyn gia ch?m  y t? c?a b?n v? vi?c s? d?ng vitamin v khong ch?t b? sung.  S?ng n?ng ??ng. T?p th? d?c v ho?t ??ng th? ch?t s? gip duy tr kh?  n?ng lm nh?ng ?i?u b?n mu?n lm.  Cn b?ng ho?t ??ng v?i th?i gian ngh? ng?i.  Tm m?t v? tr tho?i mi n?u b?n tr? nn kh th?.  Tm hi?u v s? d?ng cc k? thu?t th? gin.  Tm hi?u v s? d?ng cc k? thu?t th? c ki?m sot theo ch? d?n c?a chuyn gia ch?m [place] y t?. K? thu?t th? c ki?m sot bao g?m:  Th? mm mi. K? thu?t th? ny b?t ??u v?i vi?c th? vo (ht) b?ng m?i trong 1 giy. Ti?p theo, mm mi nh? th? b?n ??nh hut so. Sau ? th? ra (thot kh) qua mi mm trong 2 giy.  Th? b?ng c? honh. B?t ??u b?ng cch ??t m?t tay ln b?ng, ngay trn th?t l?ng c?a b?n. Ht vo t? t? b?ng m?i. Tay trn b?ng c?n di chuy?n ra ngoi. Sau ? th? ra t? t? qua mi mm. B?n s? c th? c?m nh?n ???c tay trn b?ng di chuy?n trong khi b?n th? ra.  Tm hi?u v s? d?ng ho c ki?m sot ?? ??y d?ch nh?y ra kh?i ph?i. Ho c ki?m sot l m?t lo?t ho ng?n, t?ng d?n. Cc b??c c?a ho c ki?m sot l: 1. H?i nghing ??u v? pha tr??c. 2. Th? su s? d?ng th? b?ng c? honh. 3. C? g?ng nn th? trong 3 giy. 4. Gi? mi?ng h?i m? trong khi ho hai l?n. 5. Nh? m?i d?ch nh?y vo kh?n gi?y. 6. Ngh? ng?i v l?p l?i cc b??c ny m?t l?n ho?c hai l?n khi c?n thi?t.  S? d?ng t?t c? cc lo?i v?cxin  b?o v? ???c chuyn gia ch?m [place] y t? c?a b?n ?? xu?t, ??c bi?t l v?cxin ph? c?u khu?n v v?cxin cm.  H?c qu?n l c?ng th?ng.  ??t l?ch v gi? m?i cu?c h?n khm l?i theo ch? d?n c?a chuyn gia ch?m [place] y t?. B?n c?n gi? t?t c? cc cu?c h?n.  Tham gia ph?c h?i ch?c n?ng ph?i theo ch? d?n c?a chuyn gia ch?m [place] y t?.  S? d?ng oxy t?i nh nh? ???c ?? xu?t. HY THAM V?N V?I CHUYN GIA Y T? N?U:   B?n b? ho ra nhi?u d?ch nh?y h?n bnh th??ng.  C s? thay ??i v? mu s?c ho?c ?? ??c c?a d?ch nh?y.  Th? v?t v? h?n bnh th??ng.  Th? nhanh h?n bnh th??ng.  Mu da c?a b?n xanh tm h?n bnh th??ng.  B?n h?t thu?c s? d?ng cho vi?c th? c?a b?n.  B?n ?ang lo l?ng, s? hi hay b?n ch?n.  B?n b? s?t. HY NGAY L?P T?C THAM V?N V?I CHUYN GIA Y T? N?U:   Tim ??p nhanh.  B?n b? kh th? trong khi ngh? ng?i.  B?n b? kh th? khi?n b?n khng th? ni chuy?n.  B?n b? kh th? khi?n b?n khng th? th?c hi?n nh?ng ho?t ??ng th? ch?t bnh th??ng.  B?n b? ?au ng?c ko di h?n 5 pht.  B?n b? ??ng kinh.  Milagros Loll ?nh ho?c b?n b c?a b?n nh?n th?y b?n b? kch ??ng ho?c l?n l?n. ??M B?O B?N:   Hi?u cc h??ng d?n ny.  S? theo di tnh tr?ng c?a mnh.  S? yu c?u tr? gip ngay l?p t?c n?u b?n c?m th?y khng kh?e ho?c tnh tr?ng tr? nn t?i h?n. Document Released: 03/10/2005 Document Revised: 02/23/2012 [facility] Patient Information 2014 [place], [place].

## 2013-03-14 ENCOUNTER — Encounter: Payer: Self-pay | Admitting: *Deleted

## 2013-05-22 ENCOUNTER — Telehealth: Payer: Self-pay

## 2013-05-22 NOTE — Telephone Encounter (Signed)
Patient needs face to face encounter for shoes, called her to advise.

## 2013-05-22 NOTE — Telephone Encounter (Signed)
Jennie at [facility] is looking for paperwork from Dr. Conley Rolls for an order on Diabetic Shoes.   Fax [phone]

## 2013-06-17 ENCOUNTER — Emergency Department (HOSPITAL_COMMUNITY): Payer: PRIVATE HEALTH INSURANCE

## 2013-06-17 ENCOUNTER — Inpatient Hospital Stay (HOSPITAL_COMMUNITY)
Admission: EM | Admit: 2013-06-17 | Discharge: 2013-06-20 | DRG: 190 | Disposition: A | Discharge status: Home Health | Payer: PRIVATE HEALTH INSURANCE | Attending: Internal Medicine | Admitting: Internal Medicine

## 2013-06-17 ENCOUNTER — Inpatient Hospital Stay (HOSPITAL_COMMUNITY): Payer: PRIVATE HEALTH INSURANCE

## 2013-06-17 ENCOUNTER — Encounter (HOSPITAL_COMMUNITY): Payer: Self-pay | Admitting: Emergency Medicine

## 2013-06-17 DIAGNOSIS — Z87891 Personal history of nicotine dependence: Secondary | ICD-10-CM

## 2013-06-17 DIAGNOSIS — R64 Cachexia: Secondary | ICD-10-CM | POA: Diagnosis present

## 2013-06-17 DIAGNOSIS — E1149 Type 2 diabetes mellitus with other diabetic neurological complication: Secondary | ICD-10-CM | POA: Diagnosis present

## 2013-06-17 DIAGNOSIS — N3080 Other cystitis without hematuria: Secondary | ICD-10-CM

## 2013-06-17 DIAGNOSIS — J9600 Acute respiratory failure, unspecified whether with hypoxia or hypercapnia: Secondary | ICD-10-CM | POA: Diagnosis present

## 2013-06-17 DIAGNOSIS — J449 Chronic obstructive pulmonary disease, unspecified: Secondary | ICD-10-CM

## 2013-06-17 DIAGNOSIS — R0603 Acute respiratory distress: Secondary | ICD-10-CM

## 2013-06-17 DIAGNOSIS — J441 Chronic obstructive pulmonary disease with (acute) exacerbation: Principal | ICD-10-CM | POA: Diagnosis present

## 2013-06-17 DIAGNOSIS — G4700 Insomnia, unspecified: Secondary | ICD-10-CM

## 2013-06-17 DIAGNOSIS — E1142 Type 2 diabetes mellitus with diabetic polyneuropathy: Secondary | ICD-10-CM | POA: Diagnosis present

## 2013-06-17 DIAGNOSIS — J984 Other disorders of lung: Secondary | ICD-10-CM

## 2013-06-17 DIAGNOSIS — E119 Type 2 diabetes mellitus without complications: Secondary | ICD-10-CM

## 2013-06-17 DIAGNOSIS — Z681 Body mass index (BMI) 19 or less, adult: Secondary | ICD-10-CM

## 2013-06-17 DIAGNOSIS — D72829 Elevated white blood cell count, unspecified: Secondary | ICD-10-CM | POA: Diagnosis present

## 2013-06-17 DIAGNOSIS — E785 Hyperlipidemia, unspecified: Secondary | ICD-10-CM | POA: Diagnosis present

## 2013-06-17 DIAGNOSIS — T380X5A Adverse effect of glucocorticoids and synthetic analogues, initial encounter: Secondary | ICD-10-CM | POA: Diagnosis present

## 2013-06-17 DIAGNOSIS — J189 Pneumonia, unspecified organism: Secondary | ICD-10-CM | POA: Diagnosis present

## 2013-06-17 DIAGNOSIS — E876 Hypokalemia: Secondary | ICD-10-CM | POA: Diagnosis not present

## 2013-06-17 DIAGNOSIS — I10 Essential (primary) hypertension: Secondary | ICD-10-CM | POA: Diagnosis present

## 2013-06-17 DIAGNOSIS — Z79899 Other long term (current) drug therapy: Secondary | ICD-10-CM

## 2013-06-17 DIAGNOSIS — G629 Polyneuropathy, unspecified: Secondary | ICD-10-CM

## 2013-06-17 DIAGNOSIS — Z23 Encounter for immunization: Secondary | ICD-10-CM

## 2013-06-17 DIAGNOSIS — E46 Unspecified protein-calorie malnutrition: Secondary | ICD-10-CM | POA: Diagnosis present

## 2013-06-17 DIAGNOSIS — E0590 Thyrotoxicosis, unspecified without thyrotoxic crisis or storm: Secondary | ICD-10-CM

## 2013-06-17 DIAGNOSIS — K219 Gastro-esophageal reflux disease without esophagitis: Secondary | ICD-10-CM

## 2013-06-17 LAB — BLOOD GAS, ARTERIAL
Acid-base deficit: 1.1 mmol/L (ref 0.0–2.0)
BICARBONATE: 25.8 meq/L — AB (ref 20.0–24.0)
DRAWN BY: 308601
Delivery systems: POSITIVE
EXPIRATORY PAP: 5
FIO2: 1 %
Inspiratory PAP: 10
O2 Saturation: 99.3 %
PATIENT TEMPERATURE: 98.6
PH ART: 7.298 — AB (ref 7.350–7.450)
PO2 ART: 359 mmHg — AB (ref 80.0–100.0)
TCO2: 23.2 mmol/L (ref 0–100)
pCO2 arterial: 54.4 mmHg — ABNORMAL HIGH (ref 35.0–45.0)

## 2013-06-17 LAB — CG4 I-STAT (LACTIC ACID): Lactic Acid, Venous: 2.27 mmol/L — ABNORMAL HIGH (ref 0.5–2.2)

## 2013-06-17 LAB — COMPREHENSIVE METABOLIC PANEL
ALBUMIN: 4.2 g/dL (ref 3.5–5.2)
ALK PHOS: 81 U/L (ref 39–117)
ALT: 11 U/L (ref 0–53)
AST: 15 U/L (ref 0–37)
BILIRUBIN TOTAL: 0.3 mg/dL (ref 0.3–1.2)
BUN: 22 mg/dL (ref 6–23)
CHLORIDE: 104 meq/L (ref 96–112)
CO2: 26 mEq/L (ref 19–32)
Calcium: 8.7 mg/dL (ref 8.4–10.5)
Creatinine, Ser: 0.71 mg/dL (ref 0.50–1.35)
GFR calc Af Amer: >90 mL/min (ref >90)
GFR calc non Af Amer: 88 mL/min — ABNORMAL LOW (ref >90)
Glucose, Bld: 198 mg/dL — ABNORMAL HIGH (ref 70–99)
POTASSIUM: 3.5 meq/L — AB (ref 3.7–5.3)
Sodium: 143 mEq/L (ref 137–147)
Total Protein: 7.9 g/dL (ref 6.0–8.3)

## 2013-06-17 LAB — HEMOGLOBIN A1C
Hgb A1c MFr Bld: 8 % — ABNORMAL HIGH (ref <5.7)
Mean Plasma Glucose: 183 mg/dL — ABNORMAL HIGH (ref <117)

## 2013-06-17 LAB — URINALYSIS, ROUTINE W REFLEX MICROSCOPIC
BILIRUBIN URINE: NEGATIVE
Glucose, UA: >1000 mg/dL — AB
KETONES UR: 15 mg/dL — AB
NITRITE: POSITIVE — AB
Protein, ur: NEGATIVE mg/dL
SPECIFIC GRAVITY, URINE: 1.023 (ref 1.005–1.030)
UROBILINOGEN UA: 0.2 mg/dL (ref 0.0–1.0)
pH: 7 (ref 5.0–8.0)

## 2013-06-17 LAB — CBC
HEMATOCRIT: 40.7 % (ref 39.0–52.0)
HEMOGLOBIN: 13.1 g/dL (ref 13.0–17.0)
MCH: 28.5 pg (ref 26.0–34.0)
MCHC: 32.2 g/dL (ref 30.0–36.0)
MCV: 88.7 fL (ref 78.0–100.0)
PLATELETS: 163 10*3/uL (ref 150–400)
RBC: 4.59 MIL/uL (ref 4.22–5.81)
RDW: 13.3 % (ref 11.5–15.5)
WBC: 10 10*3/uL (ref 4.0–10.5)

## 2013-06-17 LAB — GLUCOSE, CAPILLARY
GLUCOSE-CAPILLARY: 289 mg/dL — AB (ref 70–99)
GLUCOSE-CAPILLARY: 260 mg/dL — AB (ref 70–99)
Glucose-Capillary: 307 mg/dL — ABNORMAL HIGH (ref 70–99)
Glucose-Capillary: 76 mg/dL (ref 70–99)

## 2013-06-17 LAB — CREATININE, SERUM
Creatinine, Ser: 0.7 mg/dL (ref 0.50–1.35)
GFR calc non Af Amer: 89 mL/min — ABNORMAL LOW (ref >90)

## 2013-06-17 LAB — CBC WITH DIFFERENTIAL/PLATELET
BASOS ABS: 0 10*3/uL (ref 0.0–0.1)
BASOS PCT: 1 % (ref 0–1)
Eosinophils Absolute: 0.9 10*3/uL — ABNORMAL HIGH (ref 0.0–0.7)
Eosinophils Relative: 11 % — ABNORMAL HIGH (ref 0–5)
HCT: 44.4 % (ref 39.0–52.0)
HEMOGLOBIN: 13.9 g/dL (ref 13.0–17.0)
Lymphocytes Relative: 20 % (ref 12–46)
Lymphs Abs: 1.6 10*3/uL (ref 0.7–4.0)
MCH: 27.7 pg (ref 26.0–34.0)
MCHC: 31.3 g/dL (ref 30.0–36.0)
MCV: 88.6 fL (ref 78.0–100.0)
Monocytes Absolute: 0.3 10*3/uL (ref 0.1–1.0)
Monocytes Relative: 3 % (ref 3–12)
NEUTROS ABS: 5.4 10*3/uL (ref 1.7–7.7)
NEUTROS PCT: 66 % (ref 43–77)
Platelets: 197 10*3/uL (ref 150–400)
RBC: 5.01 MIL/uL (ref 4.22–5.81)
RDW: 13.3 % (ref 11.5–15.5)
WBC: 8.2 10*3/uL (ref 4.0–10.5)

## 2013-06-17 LAB — INFLUENZA PANEL BY PCR (TYPE A & B)
H1N1 flu by pcr: NOT DETECTED
INFLBPCR: NEGATIVE
Influenza A By PCR: NEGATIVE

## 2013-06-17 LAB — URINE MICROSCOPIC-ADD ON

## 2013-06-17 LAB — MRSA PCR SCREENING: MRSA by PCR: NEGATIVE

## 2013-06-17 LAB — PRO B NATRIURETIC PEPTIDE: PRO B NATRI PEPTIDE: 77.6 pg/mL (ref 0–450)

## 2013-06-17 LAB — TROPONIN I: Troponin I: <0.3 ng/mL (ref <0.30)

## 2013-06-17 MED ORDER — IPRATROPIUM-ALBUTEROL 0.5-2.5 (3) MG/3ML IN SOLN
3.0000 mL | RESPIRATORY_TRACT | Status: DC
  Administered 2013-06-17 – 2013-06-18 (×6): 3 mL via RESPIRATORY_TRACT
  Filled 2013-06-17 (×5): qty 3

## 2013-06-17 MED ORDER — ENOXAPARIN SODIUM 40 MG/0.4ML ~~LOC~~ SOLN
40.0000 mg | SUBCUTANEOUS | Status: DC
  Administered 2013-06-17 – 2013-06-20 (×4): 40 mg via SUBCUTANEOUS
  Filled 2013-06-17 (×4): qty 0.4

## 2013-06-17 MED ORDER — DEXTROSE 5 % IV SOLN
1.0000 g | Freq: Once | INTRAVENOUS | Status: AC
  Administered 2013-06-17: 1 g via INTRAVENOUS
  Filled 2013-06-17: qty 10

## 2013-06-17 MED ORDER — INFLUENZA VAC SPLIT QUAD 0.5 ML IM SUSP
0.5000 mL | INTRAMUSCULAR | Status: AC
  Administered 2013-06-19: 11:00:00 0.5 mL via INTRAMUSCULAR
  Filled 2013-06-17 (×2): qty 0.5

## 2013-06-17 MED ORDER — GABAPENTIN 100 MG PO CAPS
200.0000 mg | ORAL_CAPSULE | Freq: Three times a day (TID) | ORAL | Status: DC
  Administered 2013-06-17 – 2013-06-20 (×11): 200 mg via ORAL
  Filled 2013-06-17 (×12): qty 2

## 2013-06-17 MED ORDER — SODIUM CHLORIDE 0.9 % IJ SOLN
3.0000 mL | Freq: Two times a day (BID) | INTRAMUSCULAR | Status: DC
  Administered 2013-06-19 – 2013-06-20 (×2): 3 mL via INTRAVENOUS

## 2013-06-17 MED ORDER — ALBUTEROL (5 MG/ML) CONTINUOUS INHALATION SOLN
10.0000 mg/h | INHALATION_SOLUTION | Freq: Once | RESPIRATORY_TRACT | Status: AC
  Administered 2013-06-17: 10 mg/h via RESPIRATORY_TRACT
  Filled 2013-06-17: qty 20

## 2013-06-17 MED ORDER — FINASTERIDE 5 MG PO TABS
5.0000 mg | ORAL_TABLET | Freq: Every day | ORAL | Status: DC
  Administered 2013-06-17 – 2013-06-20 (×4): 5 mg via ORAL
  Filled 2013-06-17 (×4): qty 1

## 2013-06-17 MED ORDER — SODIUM CHLORIDE 0.9 % IJ SOLN: 3.0000 mL | INTRAMUSCULAR | Status: DC | PRN

## 2013-06-17 MED ORDER — ALUM & MAG HYDROXIDE-SIMETH 200-200-20 MG/5ML PO SUSP: 30.0000 mL | Freq: Four times a day (QID) | ORAL | Status: DC | PRN

## 2013-06-17 MED ORDER — ROCURONIUM BROMIDE 50 MG/5ML IV SOLN
INTRAVENOUS | Status: AC
  Filled 2013-06-17: qty 2

## 2013-06-17 MED ORDER — HYDROCODONE-ACETAMINOPHEN 5-325 MG PO TABS
1.0000 | ORAL_TABLET | ORAL | Status: DC | PRN
  Administered 2013-06-19: 1 via ORAL
  Filled 2013-06-17: qty 2

## 2013-06-17 MED ORDER — DEXTROSE 5 % IV SOLN
1.0000 g | INTRAVENOUS | Status: DC
  Administered 2013-06-18 – 2013-06-20 (×3): 1 g via INTRAVENOUS
  Filled 2013-06-17 (×3): qty 10

## 2013-06-17 MED ORDER — ONDANSETRON HCL 4 MG PO TABS: 4.0000 mg | ORAL_TABLET | Freq: Four times a day (QID) | ORAL | Status: DC | PRN

## 2013-06-17 MED ORDER — SODIUM CHLORIDE 0.9 % IV SOLN: 250.0000 mL | INTRAVENOUS | Status: DC | PRN

## 2013-06-17 MED ORDER — PREDNISONE 20 MG PO TABS
40.0000 mg | ORAL_TABLET | Freq: Every day | ORAL | Status: DC
  Administered 2013-06-17 – 2013-06-20 (×4): 40 mg via ORAL
  Filled 2013-06-17 (×5): qty 2

## 2013-06-17 MED ORDER — TAMSULOSIN HCL 0.4 MG PO CAPS
0.8000 mg | ORAL_CAPSULE | Freq: Every day | ORAL | Status: DC
  Administered 2013-06-17 – 2013-06-19 (×3): 0.8 mg via ORAL
  Filled 2013-06-17 (×4): qty 2

## 2013-06-17 MED ORDER — POLYETHYLENE GLYCOL 3350 17 G PO PACK
17.0000 g | PACK | Freq: Every day | ORAL | Status: DC | PRN
  Administered 2013-06-19: 22:00:00 17 g via ORAL
  Filled 2013-06-17: qty 1

## 2013-06-17 MED ORDER — LORAZEPAM 2 MG/ML IJ SOLN
1.0000 mg | Freq: Once | INTRAMUSCULAR | Status: AC
  Administered 2013-06-17: 1 mg via INTRAVENOUS
  Filled 2013-06-17: qty 1

## 2013-06-17 MED ORDER — INSULIN ASPART 100 UNIT/ML ~~LOC~~ SOLN
0.0000 [IU] | Freq: Three times a day (TID) | SUBCUTANEOUS | Status: DC
  Administered 2013-06-17: 11 [IU] via SUBCUTANEOUS
  Administered 2013-06-18: 3 [IU] via SUBCUTANEOUS
  Administered 2013-06-18: 8 [IU] via SUBCUTANEOUS
  Administered 2013-06-18: 3 [IU] via SUBCUTANEOUS
  Administered 2013-06-19 (×2): 5 [IU] via SUBCUTANEOUS
  Administered 2013-06-19: 3 [IU] via SUBCUTANEOUS
  Administered 2013-06-20: 12:00:00 5 [IU] via SUBCUTANEOUS
  Administered 2013-06-20: 09:00:00 2 [IU] via SUBCUTANEOUS

## 2013-06-17 MED ORDER — INSULIN ASPART 100 UNIT/ML ~~LOC~~ SOLN
0.0000 [IU] | Freq: Every day | SUBCUTANEOUS | Status: DC
  Administered 2013-06-17: 3 [IU] via SUBCUTANEOUS

## 2013-06-17 MED ORDER — TIOTROPIUM BROMIDE MONOHYDRATE 18 MCG IN CAPS
18.0000 ug | ORAL_CAPSULE | Freq: Every day | RESPIRATORY_TRACT | Status: DC
  Administered 2013-06-17 – 2013-06-20 (×4): 18 ug via RESPIRATORY_TRACT
  Filled 2013-06-17: qty 5

## 2013-06-17 MED ORDER — ALBUTEROL SULFATE (2.5 MG/3ML) 0.083% IN NEBU: 2.5000 mg | INHALATION_SOLUTION | RESPIRATORY_TRACT | Status: DC | PRN

## 2013-06-17 MED ORDER — SODIUM CHLORIDE 0.9 % IJ SOLN
3.0000 mL | Freq: Two times a day (BID) | INTRAMUSCULAR | Status: DC
  Administered 2013-06-19: 3 mL via INTRAVENOUS

## 2013-06-17 MED ORDER — DEXTROSE 5 % IV SOLN
500.0000 mg | Freq: Once | INTRAVENOUS | Status: AC
  Administered 2013-06-17: 500 mg via INTRAVENOUS
  Filled 2013-06-17: qty 500

## 2013-06-17 MED ORDER — LISINOPRIL-HYDROCHLOROTHIAZIDE 10-12.5 MG PO TABS: 1.0000 | ORAL_TABLET | Freq: Every day | ORAL | Status: DC

## 2013-06-17 MED ORDER — SUCCINYLCHOLINE CHLORIDE 20 MG/ML IJ SOLN
INTRAMUSCULAR | Status: AC
  Filled 2013-06-17: qty 1

## 2013-06-17 MED ORDER — ONDANSETRON HCL 4 MG/2ML IJ SOLN: 4.0000 mg | Freq: Four times a day (QID) | INTRAMUSCULAR | Status: DC | PRN

## 2013-06-17 MED ORDER — ETOMIDATE 2 MG/ML IV SOLN
INTRAVENOUS | Status: AC
  Filled 2013-06-17: qty 20

## 2013-06-17 MED ORDER — SIMVASTATIN 20 MG PO TABS
20.0000 mg | ORAL_TABLET | Freq: Every day | ORAL | Status: DC
  Administered 2013-06-17 – 2013-06-19 (×3): 20 mg via ORAL
  Filled 2013-06-17 (×4): qty 1

## 2013-06-17 MED ORDER — LIDOCAINE HCL (CARDIAC) 20 MG/ML IV SOLN
INTRAVENOUS | Status: AC
  Filled 2013-06-17: qty 5

## 2013-06-17 MED ORDER — PANTOPRAZOLE SODIUM 40 MG PO TBEC
40.0000 mg | DELAYED_RELEASE_TABLET | Freq: Every day | ORAL | Status: DC
  Administered 2013-06-17 – 2013-06-20 (×4): 40 mg via ORAL
  Filled 2013-06-17 (×4): qty 1

## 2013-06-17 MED ORDER — LISINOPRIL 10 MG PO TABS
10.0000 mg | ORAL_TABLET | Freq: Every day | ORAL | Status: DC
  Administered 2013-06-17 – 2013-06-20 (×4): 10 mg via ORAL
  Filled 2013-06-17 (×4): qty 1

## 2013-06-17 MED ORDER — MONTELUKAST SODIUM 10 MG PO TABS
10.0000 mg | ORAL_TABLET | Freq: Every day | ORAL | Status: DC
  Administered 2013-06-17 – 2013-06-19 (×3): 10 mg via ORAL
  Filled 2013-06-17 (×4): qty 1

## 2013-06-17 MED ORDER — DEXTROSE 5 % IV SOLN
500.0000 mg | INTRAVENOUS | Status: DC
  Administered 2013-06-18 – 2013-06-20 (×3): 500 mg via INTRAVENOUS
  Filled 2013-06-17 (×3): qty 500

## 2013-06-17 MED ORDER — HYDROCHLOROTHIAZIDE 12.5 MG PO CAPS
12.5000 mg | ORAL_CAPSULE | Freq: Every day | ORAL | Status: DC
  Administered 2013-06-17 – 2013-06-20 (×4): 12.5 mg via ORAL
  Filled 2013-06-17 (×5): qty 1

## 2013-06-17 MED ORDER — SODIUM CHLORIDE 0.9 % IV SOLN: INTRAVENOUS | Status: DC

## 2013-06-17 MED ORDER — POTASSIUM CHLORIDE CRYS ER 20 MEQ PO TBCR
20.0000 meq | EXTENDED_RELEASE_TABLET | Freq: Once | ORAL | Status: AC
  Administered 2013-06-17: 20 meq via ORAL
  Filled 2013-06-17: qty 1

## 2013-06-17 NOTE — ED Notes (Addendum)
Per EMS, Patient called 911 for resp distress @0000 , EMS remained on scene for @ 90 minutes with patient on CPAP, patient improved and declined transport, patient was wheezing but had improved following Albuterol neb and CPAP. Patient then again called 911 just PTA again in resp distress. Patient was given 10mg  Albuterol neb, 568mcg of Atrovent neb, 125mg  Solumedrol IV, 2gm Magnesium IV, 1mg  1:1 Epinephrine nebulized, and 0.4mg  Nitro SL and then placed patient on CPAP as they arrived to ED. Patient made a facial expression indicating he was in severe distress but has improved with CPAP. Patient is non compliant with HTN meds, is supposed to be taking lisinopril-hctz, last filled 10 months ago 55 remaining in bottle.

## 2013-06-17 NOTE — ED Notes (Signed)
He remains calm and in no distress.  He drinks and takes his med without difficulty.  I have just phoned report to Amy, RN in ICU and we decide I will transport after chest CT.

## 2013-06-17 NOTE — ED Notes (Signed)
Lab at bedside

## 2013-06-17 NOTE — Progress Notes (Signed)
Noticed urinalysis from this am shows UTI, not noted in physicians note, pt already on rocephin, (and zithromax) which covers urine. (culture pending)  Will report off to next shift in am to make sure MD aware.

## 2013-06-17 NOTE — H&P (Signed)
[facility]  History and Physical [place] Morga L. Ardeth Perfect, MD Pager [phone] (if 7P to 7A, page night hospitalist on [email or web address] IZT:[id number] DOB: [birth year] DOA: 06/17/2013   PCP: [name], DO   Chief Complaint: acute hypoxic respiratory failure 2/2 COPD exacerbation   HPI:  Pt speaks poor english w/ PMH of COPD , DM , HTN  Presents via EMS after having significant respiratory distress. While critically ill about 6 hours prior, he is  Now significantly improved and sating well on [place] alone. He was being treated w/ BiPAP, epi nebs, continuous nebs, and mag prior to arrival and in the ED. It is unclear the cause of the exacerbation, but he does report being warm over the past few days. [facility] was asked to admit for COPD exacerbation   Of note, re: HTN hx, his pills bottles were full on EMS arrival so unlikely he was taking his home meds    Review of Systems:  Neg except as noted above   Past Medical History  Diagnosis Date  . Hyperlipidemia   . Hypertension   . Diabetic neuropathy     /H&P 01/30/2013 (01/30/2013)  . Diabetes mellitus   . BPH (benign prostatic hyperplasia)     /H&P 01/30/2013 (01/30/2013)  . Emphysematous cystitis     /H&P 01/30/2013 (01/30/2013)  . COPD (chronic obstructive pulmonary disease)   . Orthopnoea     Past Surgical History  Procedure Laterality Date  . No past surgeries      Social History:  reports that he has quit smoking. His smoking use included Cigarettes. He has a 10.5 pack-year smoking history. He has never used smokeless tobacco. He reports that he does not drink alcohol or use illicit drugs.  No Known Allergies  History reviewed. No pertinent family history.   Prior to Admission medications   Medication Sig Start Date End Date Taking? Authorizing Provider  albuterol (VENTOLIN HFA) 108 (90 BASE) MCG/ACT inhaler Inhale 2 puffs into the lungs every 4 (four) hours while awake. 02/09/13   Ellison Carwin, MD  Blood Glucose  Monitoring Suppl (FREESTYLE LITE) DEVI 1 each by Does not apply route every morning. 02/09/13   Ellison Carwin, MD  finasteride (PROSCAR) 5 MG tablet Take 5 mg by mouth daily. 11/10/12   Leeanne Rio, MD  Fluticasone-Salmeterol (ADVAIR) 500-50 MCG/DOSE AEPB Inhale 1 puff into the lungs 2 (two) times daily. 02/09/13   Ellison Carwin, MD  gabapentin (NEURONTIN) 100 MG capsule Take 200 mg by mouth 3 (three) times daily. 11/06/12 11/06/13  Waldemar Dickens, MD  lisinopril-hydrochlorothiazide (PRINZIDE,ZESTORETIC) 10-12.5 MG per tablet Take 1 tablet by mouth daily.    Historical Provider, MD  metFORMIN (GLUCOPHAGE) 500 MG tablet Take 1 tablet (500 mg total) by mouth 2 (two) times daily with a meal. 1 tablet by mouth twice daily for diabetes 09/16/12   Posey Boyer, MD  metroNIDAZOLE (FLAGYL) 500 MG tablet Take 500 mg by mouth every 8 (eight) hours. 11/10/12   Leeanne Rio, MD  montelukast (SINGULAIR) 10 MG tablet Take 10 mg by mouth at bedtime.    Historical Provider, MD  nitrofurantoin, macrocrystal-monohydrate, (MACROBID) 100 MG capsule Take 1 capsule (100 mg total) by mouth 2 (two) times daily. Take 1 tablet 8/21 in the evening, then twice a day for 9 more days. 02/01/13   Sharon Mt Street, MD  omeprazole (PRILOSEC) 20 MG capsule Take 1 capsule (20 mg total) by mouth daily. 01/03/12 01/30/13  Thao P Le, DO  pravastatin (PRAVACHOL) 40 MG tablet Take 1 tablet (40 mg total) by mouth daily. 09/16/11   Thao P Le, DO  predniSONE (DELTASONE) 50 MG tablet Take 1 tablet (50 mg total) by mouth daily. Take 1 tablet 8/22 and 1 tablet 8/23. 02/01/13   Sharon Mt [address], MD  tamsulosin (FLOMAX) 0.4 MG CAPS capsule Take 0.8 mg by mouth daily after supper. 11/10/12   Leeanne Rio, MD  tiotropium (SPIRIVA) 18 MCG inhalation capsule Place 1 capsule (18 mcg total) into inhaler and inhale daily. 1 cap inhaled daily 01/03/12   Glenford Bayley, DO   Physical Exam: Filed Vitals:   06/17/13 0600 06/17/13 0615  06/17/13 0630 06/17/13 0645  BP: 151/98 140/91 150/86 151/90  Pulse: 94 96 97 103  Temp:      TempSrc:      Resp: 18 17 16 22   Height:      Weight:      SpO2: 100% 99% 100% 98%     General:  Male in NAD on [place]   Eyes: moist, no icterus   Cardiovascular: RRR, no MRG   Respiratory: CTAB, wheezes present diffusely but air movement present throughout   Abdomen: soft, NT, ND   Skin: warm, dry, no edema   Musculoskeletal: no focal anomalies   Psychiatric: unable to assess 2/2 language barrier  Neurologic: no focal anomalies   Wt Readings from Last 3 Encounters:  06/17/13 47.628 kg (105 lb)  02/09/13 47.628 kg (105 lb)  02/01/13 49.9 kg (110 lb 0.2 oz)    Labs on Admission:  Basic Metabolic Panel:  Recent Labs Lab 06/17/13 0540  NA 143  K 3.5*  CL 104  CO2 26  GLUCOSE 198*  BUN 22  CREATININE 0.71  CALCIUM 8.7    Liver Function Tests:  Recent Labs Lab 06/17/13 0540  AST 15  ALT 11  ALKPHOS 81  BILITOT 0.3  PROT 7.9  ALBUMIN 4.2   No results found for this basename: LIPASE, AMYLASE,  in the last 168 hours No results found for this basename: AMMONIA,  in the last 168 hours  CBC:  Recent Labs Lab 06/17/13 0540  WBC 8.2  NEUTROABS 5.4  HGB 13.9  HCT 44.4  MCV 88.6  PLT 197    Cardiac Enzymes:  Recent Labs Lab 06/17/13 0540  TROPONINI <0.30    Troponin (Point of Care Test) No results found for this basename: TROPIPOC,  in the last 72 hours  BNP (last 3 results)  Recent Labs  01/30/13 2130 06/17/13 0540  PROBNP 143.4 77.6    CBG: No results found for this basename: GLUCAP,  in the last 168 hours   Radiological Exams on Admission: Dg Chest Port 1 View  06/17/2013   CLINICAL DATA:  Shortness of breath, respiratory distress  EXAM: PORTABLE CHEST - 1 VIEW  COMPARISON:  Prior radiograph from 01/30/2013  FINDINGS: Mild asymmetric prominence of the right hilum is similar as compared to the prior exam. Cardiac and mediastinal  silhouettes are unchanged.  Lung volumes are mildly increased with attenuation of the pulmonary markings, suggestive of COPD. No focal infiltrate, pulmonary edema, or pleural effusion. No pneumothorax.  No acute osseous abnormality.  IMPRESSION: 1. No acute cardiopulmonary process. 2. Emphysema. 3. Asymmetric density within the right hilum, more prominent as compared to the prior exam. While this finding may be secondary to overlapping/ superimposed shadows, possible adenopathy or mass is not entirely excluded. Further evaluation with cross-sectional imaging could be performed as clinically  indicated.   Electronically Signed   By: Jeannine Boga M.D.   On: 06/17/2013 06:28    TTE: 01/31/13 : EF 55%, no regional wall abnormalities    Active Problems:   COPD exacerbation   Assessment/Plan 1. Admit to SDU. [facility] team 8  2. COPD exacerbation - admit to SDU. Nebs, spiriva, PO steroids, azith/roceph, flu panel and sputum cx ordered, [place] as needed . Cont home montelukast  3. HTN - resume lisinopril-HCT  4. HLD - resume pravachol 40 5. Dm - SSI . Low car diet. neurontin 6. PBH - flomax , proscar 7. PPx - lovenox   Code Status: full  Family Communication: none Disposition Plan/Anticipated LOS: 3-5 days   Time spent: 10 minutes  Velna Hatchet, MD  Internal Medicine Pager [phone] If 7PM-7AM, please contact night-coverage at [email or web address], password [facility] 06/17/2013, 8:06 AM

## 2013-06-17 NOTE — Progress Notes (Signed)
ANTIBIOTIC CONSULT NOTE - INITIAL  Pharmacy Consult for Rocephin Indication: COPD exacerbation, Empiric CAP coverage  No Known Allergies  Patient Measurements: Height: 5\' 3"  (160 cm) Weight: 105 lb (47.628 kg) IBW/kg (Calculated) : 56.9 Adjusted Body Weight:   Vital Signs: Temp: 98.1 F (36.7 C) (01/04 0830) Temp src: Oral (01/04 0830) BP: 148/84 mmHg (01/04 0800) Pulse Rate: 97 (01/04 0800) Intake/Output from previous day:   Intake/Output from this shift:    Labs:  Recent Labs  06/17/13 0540  WBC 8.2  HGB 13.9  PLT 197  CREATININE 0.71   Estimated Creatinine Clearance: 52.1 ml/min (by C-G formula based on Cr of 0.71). No results found for this basename: VANCOTROUGH, VANCOPEAK, VANCORANDOM, GENTTROUGH, GENTPEAK, GENTRANDOM, TOBRATROUGH, TOBRAPEAK, TOBRARND, AMIKACINPEAK, AMIKACINTROU, AMIKACIN,  in the last 72 hours   Microbiology: No results found for this or any previous visit (from the past 720 hour(s)).  Medical History: Past Medical History  Diagnosis Date  . Hyperlipidemia   . Hypertension   . Diabetic neuropathy     /H&P 01/30/2013 (01/30/2013)  . Diabetes mellitus   . BPH (benign prostatic hyperplasia)     /H&P 01/30/2013 (01/30/2013)  . Emphysematous cystitis     /H&P 01/30/2013 (01/30/2013)  . COPD (chronic obstructive pulmonary disease)   . Orthopnoea     Medications:  Scheduled:  . enoxaparin (LOVENOX) injection  40 mg Subcutaneous Q24H  . etomidate      . finasteride  5 mg Oral Daily  . gabapentin  200 mg Oral TID  . lisinopril  10 mg Oral Daily   And  . hydrochlorothiazide  12.5 mg Oral Daily  . insulin aspart  0-15 Units Subcutaneous TID WC  . insulin aspart  0-5 Units Subcutaneous QHS  . ipratropium-albuterol  3 mL Nebulization Q4H  . lidocaine (cardiac) 100 mg/22ml      . montelukast  10 mg Oral QHS  . pantoprazole  40 mg Oral Daily  . predniSONE  40 mg Oral Q breakfast  . rocuronium      . simvastatin  20 mg Oral q1800  . sodium  chloride  3 mL Intravenous Q12H  . sodium chloride  3 mL Intravenous Q12H  . succinylcholine      . tamsulosin  0.8 mg Oral QPC supper  . tiotropium  18 mcg Inhalation Daily   Infusions:  . sodium chloride    . azithromycin (ZITHROMAX) 500 MG IVPB    . [START ON 06/18/2013] azithromycin    . cefTRIAXone (ROCEPHIN)  IV 1 g (06/17/13 0837)   PRN: sodium chloride, albuterol, alum & mag hydroxide-simeth, HYDROcodone-acetaminophen, ondansetron (ZOFRAN) IV, ondansetron, polyethylene glycol, sodium chloride Assessment: 80 yo M with COPD exacerbation.  To receive Rocephin for empiric CAP coverage.  Goal of Therapy:  Eradication of infection Appropriate antibiotic coverage.  Plan:  Rocephin 1 Gm IV q 24 hours. Follow up culture results  Gypsy Decant 06/17/2013,8:48 AM

## 2013-06-17 NOTE — ED Provider Notes (Signed)
CSN: [id number]     Arrival date & time 06/17/13  0865 History   First MD Initiated Contact with Patient 06/17/13 [phone]     Chief Complaint  Patient presents with  . Respiratory Distress  . Hypertension   (Consider location/radiation/quality/duration/timing/severity/associated sxs/prior Treatment) HPI 80 year old male presents to emergency room via EMS with respiratory distress.  Patient on CPAP.  History limited to his respiratory distress.  Per EMS, they were called at midnight due to shortness of breath.  Patient received albuterol neb through CPAP.  He refused transport.  Patient then called again around 5 AM.  He was given 10 mg of albuterol, 0.5 mg of Atrovent, Solu-Medrol.  Without improvement, EMS then gave 2 g of magnesium, and an epi neb.  Patient noted be hypertensive, has not been taking his blood pressure medications.  He denies any chest pain.  EMS reports he is only slightly improved with less diaphoresis since being on the CPAP. Past Medical History  Diagnosis Date  . Hyperlipidemia   . Hypertension   . Diabetic neuropathy     /H&P 01/30/2013 (01/30/2013)  . Diabetes mellitus   . BPH (benign prostatic hyperplasia)     /H&P 01/30/2013 (01/30/2013)  . Emphysematous cystitis     /H&P 01/30/2013 (01/30/2013)  . COPD (chronic obstructive pulmonary disease)   . Orthopnoea    Past Surgical History  Procedure Laterality Date  . No past surgeries     History reviewed. No pertinent family history. History  Substance Use Topics  . Smoking status: Former Smoker -- 0.25 packs/day for 42 years    Types: Cigarettes  . Smokeless tobacco: Never Used     Comment: 01/30/2013 "quit smoking ~ 10 yr ago"  . Alcohol Use: No    Review of Systems  Unable to perform ROS: Acuity of condition    Allergies  Review of patient's allergies indicates no known allergies.  Home Medications   Current Outpatient Rx  Name  Route  Sig  Dispense  Refill  . albuterol (VENTOLIN HFA) 108 (90 BASE)  MCG/ACT inhaler   Inhalation   Inhale 2 puffs into the lungs every 4 (four) hours while awake.   1 Inhaler   12   . Blood Glucose Monitoring Suppl (FREESTYLE LITE) DEVI   Does not apply   1 each by Does not apply route every morning.   1 each   0   . finasteride (PROSCAR) 5 MG tablet   Oral   Take 5 mg by mouth daily.         . Fluticasone-Salmeterol (ADVAIR) 500-50 MCG/DOSE AEPB   Inhalation   Inhale 1 puff into the lungs 2 (two) times daily.   60 each   11   . gabapentin (NEURONTIN) 100 MG capsule   Oral   Take 200 mg by mouth 3 (three) times daily.         Marland Kitchen lisinopril-hydrochlorothiazide (PRINZIDE,ZESTORETIC) 10-12.5 MG per tablet   Oral   Take 1 tablet by mouth daily.         . metFORMIN (GLUCOPHAGE) 500 MG tablet   Oral   Take 1 tablet (500 mg total) by mouth 2 (two) times daily with a meal. 1 tablet by mouth twice daily for diabetes   60 tablet   5   . metroNIDAZOLE (FLAGYL) 500 MG tablet   Oral   Take 500 mg by mouth every 8 (eight) hours.         . montelukast (SINGULAIR) 10 MG tablet  Oral   Take 10 mg by mouth at bedtime.         . nitrofurantoin, macrocrystal-monohydrate, (MACROBID) 100 MG capsule   Oral   Take 1 capsule (100 mg total) by mouth 2 (two) times daily. Take 1 tablet 8/21 in the evening, then twice a day for 9 more days.   19 capsule   0   . EXPIRED: omeprazole (PRILOSEC) 20 MG capsule   Oral   Take 1 capsule (20 mg total) by mouth daily.   30 capsule   6   . pravastatin (PRAVACHOL) 40 MG tablet   Oral   Take 1 tablet (40 mg total) by mouth daily.   30 tablet   5   . predniSONE (DELTASONE) 50 MG tablet   Oral   Take 1 tablet (50 mg total) by mouth daily. Take 1 tablet 8/22 and 1 tablet 8/23.   2 tablet   0   . tamsulosin (FLOMAX) 0.4 MG CAPS capsule   Oral   Take 0.8 mg by mouth daily after supper.         . tiotropium (SPIRIVA) 18 MCG inhalation capsule   Inhalation   Place 1 capsule (18 mcg total) into  inhaler and inhale daily. 1 cap inhaled daily   30 capsule   11    BP 140/91  Pulse 96  Temp(Src) 97.7 F (36.5 C) (Axillary)  Resp 17  Ht 5\' 3"  (1.6 m)  Wt 105 lb (47.628 kg)  BMI 18.60 kg/m2  SpO2 99% Physical Exam  Nursing note and vitals reviewed. Constitutional: He is oriented to person, place, and time. He appears well-developed and well-nourished. He appears distressed.  Thin man, frail  HENT:  Head: Normocephalic and atraumatic.  Nose: Nose normal.  Mouth/Throat: Oropharynx is clear and moist.  Eyes: Conjunctivae and EOM are normal. Pupils are equal, round, and reactive to light.  Neck: Normal range of motion. Neck supple. No JVD present. No tracheal deviation present. No thyromegaly present.  Cardiovascular: Normal rate, regular rhythm, normal heart sounds and intact distal pulses.  Exam reveals no gallop and no friction rub.   No murmur heard. Pulmonary/Chest: No stridor. He is in respiratory distress. He has wheezes. He has no rales. He exhibits no tenderness.  Patient is in tripod position, CPAP in place, tachypnea, wheezing throughout all fields.  Poor air movement  Abdominal: Soft. Bowel sounds are normal. He exhibits no distension and no mass. There is no tenderness. There is no rebound and no guarding.  Musculoskeletal: Normal range of motion. He exhibits no edema and no tenderness.  Lymphadenopathy:    He has no cervical adenopathy.  Neurological: He is alert and oriented to person, place, and time. He exhibits normal muscle tone. Coordination normal.  Skin: Skin is warm and dry. No rash noted. No erythema. No pallor.  Psychiatric: He has a normal mood and affect. His behavior is normal. Judgment and thought content normal.    ED Course  Procedures (including critical care time) CRITICAL CARE Performed by: Kalman Drape Total critical care time: 90 min Critical care time was exclusive of separately billable procedures and treating other patients. Critical  care was necessary to treat or prevent imminent or life-threatening deterioration. Critical care was time spent personally by me on the following activities: development of treatment plan with patient and/or surrogate as well as nursing, discussions with consultants, evaluation of patient's response to treatment, examination of patient, obtaining history from patient or surrogate, ordering and performing treatments  and interventions, ordering and review of laboratory studies, ordering and review of radiographic studies, pulse oximetry and re-evaluation of patient's condition.  Labs Review Labs Reviewed  CBC WITH DIFFERENTIAL - Abnormal; Notable for the following:    Eosinophils Relative 11 (*)    Eosinophils Absolute 0.9 (*)    All other components within normal limits  COMPREHENSIVE METABOLIC PANEL - Abnormal; Notable for the following:    Potassium 3.5 (*)    Glucose, Bld 198 (*)    GFR calc non Af Amer 88 (*)    All other components within normal limits  BLOOD GAS, ARTERIAL - Abnormal; Notable for the following:    pH, Arterial 7.298 (*)    pCO2 arterial 54.4 (*)    pO2, Arterial 359.0 (*)    Bicarbonate 25.8 (*)    All other components within normal limits  CG4 I-STAT (LACTIC ACID) - Abnormal; Notable for the following:    Lactic Acid, Venous 2.27 (*)    All other components within normal limits  TROPONIN I  PRO B NATRIURETIC PEPTIDE  URINALYSIS, ROUTINE W REFLEX MICROSCOPIC   Imaging Review Dg Chest Port 1 View  06/17/2013   CLINICAL DATA:  Shortness of breath, respiratory distress  EXAM: PORTABLE CHEST - 1 VIEW  COMPARISON:  Prior radiograph from 01/30/2013  FINDINGS: Mild asymmetric prominence of the right hilum is similar as compared to the prior exam. Cardiac and mediastinal silhouettes are unchanged.  Lung volumes are mildly increased with attenuation of the pulmonary markings, suggestive of COPD. No focal infiltrate, pulmonary edema, or pleural effusion. No pneumothorax.  No  acute osseous abnormality.  IMPRESSION: 1. No acute cardiopulmonary process. 2. Emphysema. 3. Asymmetric density within the right hilum, more prominent as compared to the prior exam. While this finding may be secondary to overlapping/ superimposed shadows, possible adenopathy or mass is not entirely excluded. Further evaluation with cross-sectional imaging could be performed as clinically indicated.   Electronically Signed   By: Jeannine Boga M.D.   On: 06/17/2013 06:28    EKG Interpretation    Date/Time:  Sunday June 17 2013 05:26:44 EST Ventricular Rate:  91 PR Interval:  205 QRS Duration: 108 QT Interval:  394 QTC Calculation: 485 R Axis:   -166 Text Interpretation:  Sinus rhythm Biatrial enlargement Incomplete RBBB and LAFB Probable right ventricular hypertrophy Borderline prolonged QT interval Confirmed by Nizar Cutler  MD, Chasen Mendell [id number]) on 06/17/2013 7:06:40 AM            MDM   1. COPD with acute exacerbation   2. Respiratory distress   3. HTN (hypertension)    80 year old male with respiratory distress.  After an hour and a half of a BiPAP, and a continuous neb, patient is much improved.  BiPAP, DC'd and switched to nasal cannula.  He does have some scattered end expiratory wheezing.  Patient noted to lay over the last 3-4 days.  He has had increased cough.  He, reports he's had poor by mouth intake, and not sleeping well due to cough and shortness of breath.  He reports subjective fevers.  Will discuss with hospitalist for admission, antibiotics, to cover for possible underlying infection    Kalman Drape, MD 06/17/13 [phone]

## 2013-06-17 NOTE — ED Notes (Signed)
BiPap removed, patient transitioned to 4L [place]. Will con't to monitor.   Patient dentures removed, placed in denture cup with patient label, placed in patient belonging bag, remains at bedside.

## 2013-06-17 NOTE — ED Notes (Signed)
POCT CG4 given to Dr. Otter. 

## 2013-06-18 DIAGNOSIS — J449 Chronic obstructive pulmonary disease, unspecified: Secondary | ICD-10-CM

## 2013-06-18 LAB — CBC
HCT: 38.8 % — ABNORMAL LOW (ref 39.0–52.0)
HEMOGLOBIN: 13 g/dL (ref 13.0–17.0)
MCH: 29.3 pg (ref 26.0–34.0)
MCHC: 33.5 g/dL (ref 30.0–36.0)
MCV: 87.4 fL (ref 78.0–100.0)
Platelets: 187 10*3/uL (ref 150–400)
RBC: 4.44 MIL/uL (ref 4.22–5.81)
RDW: 13.2 % (ref 11.5–15.5)
WBC: 14.7 10*3/uL — ABNORMAL HIGH (ref 4.0–10.5)

## 2013-06-18 LAB — GLUCOSE, CAPILLARY
GLUCOSE-CAPILLARY: 183 mg/dL — AB (ref 70–99)
GLUCOSE-CAPILLARY: 176 mg/dL — AB (ref 70–99)
Glucose-Capillary: 293 mg/dL — ABNORMAL HIGH (ref 70–99)

## 2013-06-18 LAB — BASIC METABOLIC PANEL
BUN: 26 mg/dL — ABNORMAL HIGH (ref 6–23)
CALCIUM: 8.6 mg/dL (ref 8.4–10.5)
CHLORIDE: 100 meq/L (ref 96–112)
CO2: 24 meq/L (ref 19–32)
Creatinine, Ser: 0.76 mg/dL (ref 0.50–1.35)
GFR calc Af Amer: >90 mL/min (ref >90)
GFR calc non Af Amer: 86 mL/min — ABNORMAL LOW (ref >90)
GLUCOSE: 188 mg/dL — AB (ref 70–99)
Potassium: 4.2 mEq/L (ref 3.7–5.3)
SODIUM: 137 meq/L (ref 137–147)

## 2013-06-18 LAB — EXPECTORATED SPUTUM ASSESSMENT W REFEX TO RESP CULTURE: SPECIAL REQUESTS: NORMAL

## 2013-06-18 MED ORDER — HYDROCODONE-HOMATROPINE 5-1.5 MG/5ML PO SYRP
5.0000 mL | ORAL_SOLUTION | ORAL | Status: DC | PRN
  Administered 2013-06-18 – 2013-06-19 (×4): 5 mL via ORAL
  Filled 2013-06-18 (×4): qty 5

## 2013-06-18 MED ORDER — ALBUTEROL SULFATE (2.5 MG/3ML) 0.083% IN NEBU
2.5000 mg | INHALATION_SOLUTION | RESPIRATORY_TRACT | Status: DC | PRN
  Administered 2013-06-19: 02:00:00 2.5 mg via RESPIRATORY_TRACT
  Filled 2013-06-18: qty 3

## 2013-06-18 MED ORDER — ALBUTEROL SULFATE (2.5 MG/3ML) 0.083% IN NEBU
2.5000 mg | INHALATION_SOLUTION | Freq: Four times a day (QID) | RESPIRATORY_TRACT | Status: DC
  Administered 2013-06-18 – 2013-06-20 (×9): 2.5 mg via RESPIRATORY_TRACT
  Filled 2013-06-18 (×17): qty 3

## 2013-06-18 MED ORDER — IPRATROPIUM-ALBUTEROL 0.5-2.5 (3) MG/3ML IN SOLN: 3.0000 mL | Freq: Three times a day (TID) | RESPIRATORY_TRACT | Status: DC

## 2013-06-18 NOTE — Progress Notes (Addendum)
Patient ID: Nathaniel [name], male   DOB: [birth year], 80 y.o.   MRN: [id number] [facility] PROGRESS NOTE  Nathaniel [name] WUJ:[id number] DOB: 03/08/1934 DOA: 06/17/2013 PCP: [name], DO  Brief narrative: Pt is 79 yo male with COPD , DM , HTN who presented to [facility] ED with main concern of progressively worsening shortness of breath that started 6 hours prior to this admission. Pt speak little English and unable to provide history on admission. No reported fevers, chills, chest pain, no abdominal or urinary concerns. [facility] was asked to admit for COPD exacerbation.  Assessment/Plan:  Active Problems:   COPD exacerbation with possible PNA - pt clinically improving and maintaining oxygen saturation at target range - continue Zithromax and Rocephin day #2 - continue BD's scheduled and as needed, continue steroid - follow up on sputum analysis    Leukocytosis - steroid induced - plan on tapering as clinically indicated - CBC in AM   HTN  - reasonable inpatient control - continue Lisinopril and HCTZ    HLD  - continue statin   DM - continue SSI while inpatient  - A1C 8, ask diabetic educator for assistance   Code Status: Full  Family Communication: Pt at bedside  Disposition Plan: Remains inpatient but when medically ready, will go home   Leisa Lenz, MD  [facility] Pager [phone]  If 7PM-7AM, please contact night-coverage [email or web address] Password [email or web address] 06/18/2013, 6:51 AM   LOS: 1 day   Consultants:  None  Studies:  Ct Chest Wo Contrast  06/17/2013  The appearance of the lungs suggests bronchitis, possibly with very early or mild multilobar bronchopneumonia involving LLL, RLL and RML. No frank lobar consolidation noted.   Dg Chest Port 1 View   06/17/2013  No acute cardiopulmonary process. Emphysema. Asymmetric density within the right hilum. ? adenopathy vs mass not entirely excluded. Further evaluation with cross-sectional imaging could be performed as indicated.      Antibiotics:  Zithromax 01/04 -->  Rocephin 01/04 -->   HPI/Subjective: No events overnight.   Objective: Filed Vitals:   06/18/13 0200 06/18/13 0325 06/18/13 0400 06/18/13 0600  BP: 104/55  120/66 117/68  Pulse: 84  89 89  Temp:   98.6 F (37 C)   TempSrc:   Oral   Resp: 12  15 14   Height:      Weight:   45.8 kg (100 lb 15.5 oz)   SpO2: 95% 97% 96% 94%    Intake/Output Summary (Last 24 hours) at 06/18/13 0651 Last data filed at 06/18/13 0600  Gross per 24 hour  Intake 1228.33 ml  Output    600 ml  Net 628.33 ml    Exam:   General:  Pt is alert, follows commands appropriately, not in acute distress  Cardiovascular: tachycardic, regular rhythm, S1, S2 appreciated   Respiratory: Bilateral rhonchi and expiratory wheezing   Abdomen: Soft, non tender, non distended, bowel sounds present, no guarding  Extremities: No edema, pulses DP and PT palpable bilaterally  Neuro: Grossly nonfocal  Data Reviewed: Basic Metabolic Panel:  Recent Labs Lab 06/17/13 0540 06/17/13 0850 06/18/13 0406  NA 143  --  137  K 3.5*  --  4.2  CL 104  --  100  CO2 26  --  24  GLUCOSE 198*  --  188*  BUN 22  --  26*  CREATININE 0.71 0.70 0.76  CALCIUM 8.7  --  8.6   Liver Function Tests:  Recent Labs Lab 06/17/13 0540  AST  15  ALT 11  ALKPHOS 81  BILITOT 0.3  PROT 7.9  ALBUMIN 4.2   CBC:  Recent Labs Lab 06/17/13 0540 06/17/13 0850 06/18/13 0406  WBC 8.2 10.0 14.7*  NEUTROABS 5.4  --   --   HGB 13.9 13.1 13.0  HCT 44.4 40.7 38.8*  MCV 88.6 88.7 87.4  PLT 197 163 187   Cardiac Enzymes:  Recent Labs Lab 06/17/13 0540  TROPONINI <0.30   CBG:  Recent Labs Lab 06/17/13 1243 06/17/13 1629 06/17/13 1950 06/17/13 2143  GLUCAP 307* 76 289* 260*     Scheduled Meds: . albuterol  2.5 mg Nebulization QID  . azithromycin  500 mg Intravenous Q24H  . cefTRIAXon  IV  1 g Intravenous Q24H  . enoxaparin injection  40 mg Subcutaneous Q24H  . finasteride  5  mg Oral Daily  . gabapentin  200 mg Oral TID  . lisinopril  10 mg Oral Daily  . hydrochlorothiazide  12.5 mg Oral Daily  . insulin aspart  0-15 Units Subcutaneous TID WC  . insulin aspart  0-5 Units Subcutaneous QHS  . montelukast  10 mg Oral QHS  . pantoprazole  40 mg Oral Daily  . predniSONE  40 mg Oral Q breakfast  . simvastatin  20 mg Oral q1800  . tamsulosin  0.8 mg Oral QPC supper  . tiotropium  18 mcg Inhalation Daily   Continuous Infusions: . sodium chloride 10 mL (06/17/13 2040)

## 2013-06-18 NOTE — Progress Notes (Signed)
Inpatient Diabetes Program Recommendations  AACE/ADA: New Consensus Statement on Inpatient Glycemic Control (2013)  Target Ranges:  Prepandial:   less than 140 mg/dL      Peak postprandial:   less than 180 mg/dL (1-2 hours)      Critically ill patients:  140 - 180 mg/dL   Reason for Visit: Consult for Uncontrolled DM  80 yo male with COPD , DM , HTN who presented to [facility] ED with main concern of progressively worsening shortness of breath that started 6 hours prior to this admission. Pt speaks very little [place].  Results for DAISUKE, BAILEY (MRN [id number]) as of 06/18/2013 17:24  Ref. Range 06/17/2013 19:50 06/17/2013 21:43 06/18/2013 07:56 06/18/2013 12:12 06/18/2013 16:53  Glucose-Capillary Latest Range: 70-99 mg/dL 289 (H) 260 (H) 183 (H) 176 (H) 293 (H)  Results for ZYAD, BOOMER (MRN [id number]) as of 06/18/2013 17:24  Ref. Range 06/17/2013 08:50  Hemoglobin A1C Latest Range: <5.7 % 8.0 (H)  Results for PAXSON, HARROWER (MRN [id number]) as of 06/18/2013 17:24  Ref. Range 06/18/2013 04:06  Sodium Latest Range: 137-147 mEq/L 137  Potassium Latest Range: 3.7-5.3 mEq/L 4.2  Chloride Latest Range: 96-112 mEq/L 100  CO2 Latest Range: 19-32 mEq/L 24  BUN Latest Range: 6-23 mg/dL 26 (H)  Creatinine Latest Range: 0.50-1.35 mg/dL 0.76  Calcium Latest Range: 8.4-10.5 mg/dL 8.6  GFR calc non Af Amer Latest Range: >90 mL/min 86 (L)  GFR calc Af Amer Latest Range: >90 mL/min >90  Glucose Latest Range: 70-99 mg/dL 188 (H)   On metformin 500 bid at home. On Prednisone 40 mg QAM. Will likely need insulin at discharge.  Recommendations: Add Lantus 10 units QHS. When pt eating, will likely need meal coverage insulin.  Will meet with pt to discuss glycemic control tomorrow am. Thank you. Lorenda Peck, RD, LDN, CDE Inpatient Diabetes Coordinator [phone]

## 2013-06-19 DIAGNOSIS — K219 Gastro-esophageal reflux disease without esophagitis: Secondary | ICD-10-CM

## 2013-06-19 LAB — BASIC METABOLIC PANEL
BUN: 38 mg/dL — ABNORMAL HIGH (ref 6–23)
CALCIUM: 8.4 mg/dL (ref 8.4–10.5)
CHLORIDE: 101 meq/L (ref 96–112)
CO2: 26 meq/L (ref 19–32)
CREATININE: 0.86 mg/dL (ref 0.50–1.35)
GFR calc Af Amer: >90 mL/min (ref >90)
GFR calc non Af Amer: 82 mL/min — ABNORMAL LOW (ref >90)
GLUCOSE: 198 mg/dL — AB (ref 70–99)
Potassium: 3.6 mEq/L — ABNORMAL LOW (ref 3.7–5.3)
Sodium: 139 mEq/L (ref 137–147)

## 2013-06-19 LAB — GLUCOSE, CAPILLARY
GLUCOSE-CAPILLARY: 201 mg/dL — AB (ref 70–99)
Glucose-Capillary: 193 mg/dL — ABNORMAL HIGH (ref 70–99)
Glucose-Capillary: 168 mg/dL — ABNORMAL HIGH (ref 70–99)
Glucose-Capillary: 212 mg/dL — ABNORMAL HIGH (ref 70–99)
Glucose-Capillary: 189 mg/dL — ABNORMAL HIGH (ref 70–99)

## 2013-06-19 LAB — CBC
HEMATOCRIT: 35.4 % — AB (ref 39.0–52.0)
Hemoglobin: 11.9 g/dL — ABNORMAL LOW (ref 13.0–17.0)
MCH: 29 pg (ref 26.0–34.0)
MCHC: 33.6 g/dL (ref 30.0–36.0)
MCV: 86.1 fL (ref 78.0–100.0)
Platelets: 179 10*3/uL (ref 150–400)
RBC: 4.11 MIL/uL — ABNORMAL LOW (ref 4.22–5.81)
RDW: 13.3 % (ref 11.5–15.5)
WBC: 11.7 10*3/uL — ABNORMAL HIGH (ref 4.0–10.5)

## 2013-06-19 LAB — URINE CULTURE: Colony Count: >=100000

## 2013-06-19 MED ORDER — POTASSIUM CHLORIDE CRYS ER 20 MEQ PO TBCR
40.0000 meq | EXTENDED_RELEASE_TABLET | Freq: Once | ORAL | Status: AC
  Administered 2013-06-19: 40 meq via ORAL
  Filled 2013-06-19: qty 2

## 2013-06-19 NOTE — Progress Notes (Signed)
Patient ID: Nathaniel [name], male   DOB: 09-26-1933, 80 y.o.   MRN: [id number]  [facility] PROGRESS NOTE  Nathaniel [name] [id number] DOB: 03/29/1934 DOA: 06/17/2013 PCP: [name], DO  Brief narrative: Pt is 80 yo male with COPD , DM , HTN who presented to [facility] ED with main concern of progressively worsening shortness of breath that started 6 hours prior to this admission. Pt speak little English and unable to provide history on admission. No reported fevers, chills, chest pain, no abdominal or urinary concerns. [facility] was asked to admit for COPD exacerbation.   Assessment/Plan:  Active Problems:  COPD exacerbation with possible PNA  - pt clinically improving and maintaining oxygen saturation at target range  - continue Zithromax and Rocephin day #3  - continue BD's scheduled and as needed, continue steroids with plan tapering as clinically indicated  - follow up on sputum analysis which is negative to date Leukocytosis  - steroid induced, white blood cell is trending down, patient afebrile over 24 hours  - plan on tapering as clinically indicated  - CBC in AM  Hypokalemia - Mild, will supplement and repeat BMP in the morning HTN  - reasonable inpatient control  - continue Lisinopril and HCTZ  HLD  - continue statin  DM  - continue SSI while inpatient  - A1C 8, appreciated diabetic educator input - Since patient is on prednisone, we'll put order for Lantus 10 units each bedtime  Code Status: Full  Family Communication: Pt at bedside  Disposition Plan: Remains inpatient but when medically ready, will go home   Leisa Lenz, MD  [facility] Pager [phone]  If 7PM-7AM, please contact night-coverage [email or web address] Password [email or web address] 06/19/2013, 9:34 AM   LOS: 2 days   Consultants:  None Studies:  Ct Chest Wo Contrast 06/17/2013 The appearance of the lungs suggests bronchitis, possibly with very early or mild multilobar bronchopneumonia involving LLL, RLL and RML. No frank lobar  consolidation noted.  Dg Chest Port 1 View 06/17/2013 No acute cardiopulmonary process. Emphysema. Asymmetric density within the right hilum. ? adenopathy vs mass not entirely excluded. Further evaluation with cross-sectional imaging could be performed as indicated.  Antibiotics:  Zithromax 01/04 -->  Rocephin 01/04 -->   HPI/Subjective: Per night. Patient reports improvement with breathing, less short of breath. Denies chest pain, no fevers or chills.  Objective: Filed Vitals:   06/18/13 1939 06/18/13 2253 06/19/13 0602 06/19/13 0825  BP:  106/60 107/68   Pulse:  95 96   Temp:  98.1 F (36.7 C) 98.2 F (36.8 C)   TempSrc:  Oral Oral   Resp:  18 18   Height:      Weight:   45.416 kg (100 lb 2 oz)   SpO2: 94% 94% 98% 93%    Intake/Output Summary (Last 24 hours) at 06/19/13 0934 Last data filed at 06/19/13 0700  Gross per 24 hour  Intake    840 ml  Output      0 ml  Net    840 ml    Exam:   General:  Pt is alert, follows commands appropriately, not in acute distress  Cardiovascular: Regular rate and rhythm, S1/S2, no murmurs, no rubs, no gallops  Respiratory: Better air movement, mild expiratory wheezing with bibasilar rhonchi  Abdomen: Soft, non tender, non distended, bowel sounds present, no guarding  Extremities: No edema, pulses DP and PT palpable bilaterally  Neuro: Grossly nonfocal  Data Reviewed: Basic Metabolic Panel:  Recent Labs Lab 06/17/13 0540  06/17/13 0850 06/18/13 0406 06/19/13 0435  NA 143  --  137 139  K 3.5*  --  4.2 3.6*  CL 104  --  100 101  CO2 26  --  24 26  GLUCOSE 198*  --  188* 198*  BUN 22  --  26* 38*  CREATININE 0.71 0.70 0.76 0.86  CALCIUM 8.7  --  8.6 8.4   Liver Function Tests:  Recent Labs Lab 06/17/13 0540  AST 15  ALT 11  ALKPHOS 81  BILITOT 0.3  PROT 7.9  ALBUMIN 4.2   CBC:  Recent Labs Lab 06/17/13 0540 06/17/13 0850 06/18/13 0406 06/19/13 0435  WBC 8.2 10.0 14.7* 11.7*  NEUTROABS 5.4  --   --   --    HGB 13.9 13.1 13.0 11.9*  HCT 44.4 40.7 38.8* 35.4*  MCV 88.6 88.7 87.4 86.1  PLT 197 163 187 179   Cardiac Enzymes:  Recent Labs Lab 06/17/13 0540  TROPONINI <0.30   CBG:  Recent Labs Lab 06/18/13 0756 06/18/13 1212 06/18/13 1653 06/18/13 2251 06/19/13 0730  GLUCAP 183* 176* 293* 193* 168*    Recent Results (from the past 240 hour(s))  MRSA PCR SCREENING     Status: None   Collection Time    06/17/13  9:35 AM      Result Value Range Status   MRSA by PCR NEGATIVE  NEGATIVE Final   Comment:            The GeneXpert MRSA Assay (FDA     approved for NASAL specimens     only), is one component of a     comprehensive MRSA colonization     surveillance program. It is not     intended to diagnose MRSA     infection nor to guide or     monitor treatment for     MRSA infections.  URINE CULTURE     Status: None   Collection Time    06/17/13 11:35 AM      Result Value Range Status   Specimen Description URINE, CLEAN CATCH   Final   Special Requests NONE   Final   Culture  Setup Time     Final   Value: 06/17/2013 19:03     Performed at [place] PENDING   Incomplete   Culture     Final   Value: Culture reincubated for better growth     Performed at [facility]   Report Status PENDING   Incomplete  CULTURE, EXPECTORATED SPUTUM-ASSESSMENT     Status: None   Collection Time    06/18/13  8:14 AM      Result Value Range Status   Specimen Description SPUTUM   Final   Special Requests Normal   Final   Sputum evaluation     Final   Value: MICROSCOPIC FINDINGS SUGGEST THAT THIS SPECIMEN IS NOT REPRESENTATIVE OF LOWER RESPIRATORY SECRETIONS. PLEASE RECOLLECT.     CALLED TO C KEATTS RN A [id number] ON 01.05.15 BY SHUEA   Report Status 06/18/2013 FINAL   Final     Studies: No results found.  Scheduled Meds: . albuterol  2.5 mg Nebulization QID  . azithromycin  500 mg Intravenous Q24H  . cefTRIAXone (ROCEPHIN)  IV  1 g Intravenous Q24H  .  enoxaparin (LOVENOX) injection  40 mg Subcutaneous Q24H  . finasteride  5 mg Oral Daily  . gabapentin  200 mg Oral TID  . lisinopril  10 mg Oral Daily   And  . hydrochlorothiazide  12.5 mg Oral Daily  . influenza vac split quadrivalent PF  0.5 mL Intramuscular Tomorrow-1000  . insulin aspart  0-15 Units Subcutaneous TID WC  . insulin aspart  0-5 Units Subcutaneous QHS  . montelukast  10 mg Oral QHS  . pantoprazole  40 mg Oral Daily  . predniSONE  40 mg Oral Q breakfast  . simvastatin  20 mg Oral q1800  . sodium chloride  3 mL Intravenous Q12H  . sodium chloride  3 mL Intravenous Q12H  . tamsulosin  0.8 mg Oral QPC supper  . tiotropium  18 mcg Inhalation Daily   Continuous Infusions: . sodium chloride 10 mL (06/17/13 2040)

## 2013-06-20 LAB — BASIC METABOLIC PANEL
BUN: 31 mg/dL — AB (ref 6–23)
CHLORIDE: 100 meq/L (ref 96–112)
CO2: 28 mEq/L (ref 19–32)
Calcium: 8.6 mg/dL (ref 8.4–10.5)
Creatinine, Ser: 0.84 mg/dL (ref 0.50–1.35)
GFR calc non Af Amer: 82 mL/min — ABNORMAL LOW (ref >90)
Glucose, Bld: 173 mg/dL — ABNORMAL HIGH (ref 70–99)
Potassium: 4.2 mEq/L (ref 3.7–5.3)
Sodium: 139 mEq/L (ref 137–147)

## 2013-06-20 LAB — CBC
HEMATOCRIT: 38.4 % — AB (ref 39.0–52.0)
HEMOGLOBIN: 12.4 g/dL — AB (ref 13.0–17.0)
MCH: 28.2 pg (ref 26.0–34.0)
MCHC: 32.3 g/dL (ref 30.0–36.0)
MCV: 87.3 fL (ref 78.0–100.0)
Platelets: 173 10*3/uL (ref 150–400)
RBC: 4.4 MIL/uL (ref 4.22–5.81)
RDW: 13.2 % (ref 11.5–15.5)
WBC: 10.1 10*3/uL (ref 4.0–10.5)

## 2013-06-20 LAB — GLUCOSE, CAPILLARY
GLUCOSE-CAPILLARY: 147 mg/dL — AB (ref 70–99)
GLUCOSE-CAPILLARY: 214 mg/dL — AB (ref 70–99)

## 2013-06-20 MED ORDER — ALBUTEROL SULFATE (2.5 MG/3ML) 0.083% IN NEBU: 2.5000 mg | INHALATION_SOLUTION | RESPIRATORY_TRACT | Status: AC | PRN

## 2013-06-20 MED ORDER — HYDROCODONE-ACETAMINOPHEN 5-325 MG PO TABS
1.0000 | ORAL_TABLET | ORAL | Status: DC | PRN
Start: 2013-06-20 — End: 2013-07-09

## 2013-06-20 MED ORDER — FINASTERIDE 5 MG PO TABS: 5.0000 mg | ORAL_TABLET | Freq: Every day | ORAL | Status: AC

## 2013-06-20 MED ORDER — TIOTROPIUM BROMIDE MONOHYDRATE 18 MCG IN CAPS: 18.0000 ug | ORAL_CAPSULE | Freq: Every day | RESPIRATORY_TRACT | Status: AC

## 2013-06-20 MED ORDER — LEVOFLOXACIN 750 MG PO TABS: 750.0000 mg | ORAL_TABLET | Freq: Every day | ORAL | Status: DC

## 2013-06-20 MED ORDER — SIMVASTATIN 20 MG PO TABS: 20.0000 mg | ORAL_TABLET | Freq: Every day | ORAL | Status: AC

## 2013-06-20 MED ORDER — PANTOPRAZOLE SODIUM 40 MG PO TBEC: 40.0000 mg | DELAYED_RELEASE_TABLET | Freq: Every day | ORAL | Status: DC

## 2013-06-20 MED ORDER — MONTELUKAST SODIUM 10 MG PO TABS: 10.0000 mg | ORAL_TABLET | Freq: Every day | ORAL | Status: AC

## 2013-06-20 MED ORDER — TAMSULOSIN HCL 0.4 MG PO CAPS: 0.8000 mg | ORAL_CAPSULE | Freq: Every day | ORAL | Status: AC

## 2013-06-20 MED ORDER — POLYETHYLENE GLYCOL 3350 17 G PO PACK: 17.0000 g | PACK | Freq: Every day | ORAL | Status: DC | PRN

## 2013-06-20 MED ORDER — AZITHROMYCIN 500 MG PO TABS: 500.0000 mg | ORAL_TABLET | Freq: Every day | ORAL | Status: DC

## 2013-06-20 NOTE — Progress Notes (Signed)
Patient is set to discharge back to [place] today. CSW left message for patient's daughter to make aware. Discharge packet in [place]. [facility] called for transport (Service Request Id: [phone]).   Winfred Leeds, [facility] Clinical Social Worker cell #: [phone]

## 2013-06-20 NOTE — Discharge Instructions (Signed)
B?nh Ph?i T?c Ngh?n M?n Tnh (Chronic Obstructive Pulmonary Disease) B?nh ph?i t?c ngh?n m?n tnh (COPD) l tnh tr?ng dng khng kh t? ph?i b? h?n ch?. Ph?i c th? khng bao gi? tr? l?i bnh th??ng, nh?ng c nh?ng bi?n php m khi s? d?ng s? c?i thi?n v?n ?? ? v lm cho b?n c?m th?y ?? h?n.  NGUYN NHN  Ht thu?c l.  Ti?p xc v?i khi thu?c l th? ??ng.  Ht ph?i cc ch?t kch thch, ch?ng h?n nh?  nhi?m khng kh, b?i, khi thu?c l, mi m?nh, bnh phun x?t ho?c h?i s?n.  Ti?n s? nhi?m trng ph?i. TRI?U CH?NG  Ho su, ko di (m?n tnh) v?i m?t l??ng l?n d?ch nh?y ??c.  Th? kh kh.  Th? d?c, ??c bi?t l khi tham gia ho?t ??ng th? ch?t.  C?m gic nh? b?n khng th? c ?? khng kh.  Kh th?.  Th? nhanh.  Da ??i mu xm ho?c h?i xanh (xanh tnh), ??c bi?t l ? cc ngn tay, ngn chn ho?c mi.  M?t m?i.  S?t cn.  S?ng ? chn, m?t c chn ho?c bn chn.  Nh?p tim nhanh.  Th??ng xuyn b? nhi?m trng ph?i.  T?c ng?c. CH?N ?ON Ch?n ?on ban ??u c th? d?a vo b?nh s?, tri?u ch?ng c?a b?n v khm th?c th?. Ki?m tra b? sung v? COPD c th? bao g?m:  Ch?p X quang ng?c.  Ch?p c?t l?p vi tnh (CT).  Ki?m tra ch?c n?ng ph?i.  Xt nghi?m mu. ?I?U TR? ?i?u tr? t?p trung vo vi?c lm cho b?n tho?i mi (ch?m [place] h? tr?). Chuyn gia ch?m [place] s?c kh?e c th? k ??n cc lo?i thu?c (d?ng ht ho?c d?ng thu?c vin) gip c?i thi?n kh? n?ng th? c?a b?n. L?a ch?n ?i?u tr? b? sung c th? bao g?m li?u php oxy v ph?c h?i ch?c n?ng ph?i. ?i?u tr? c?ng s? bao g?m vi?c gi?m ti?p xc v?i ch?t kch thch ? bi?t v tun th? k? ho?ch b? thu?c. H??NG D?N CH?M [place] T?I NH  S? d?ng t?t c? cc lo?i thu?c bao g?m c? thu?c khng sinh theo ch? d?n c?a chuyn gia ch?m [place] s?c kh?e.  S? d?ng thu?c ht theo ch? d?n c?a chuyn gia ch?m [place] s?c kh?e.  Hessie Diener s? d?ng thu?c ho?c xi-r ho lm kh ???ng th? c?a b?n (thu?c khng histamin) v lm ch?m vi?c lo?i b? cc ch?t bi ti?t. ?i?u ny lm gi?m  dung tch h h?p v c th? d?n ??n nhi?m trng.  N?u b?n ht thu?c, hy b? ht thu?c.  Trnh ti?p xc v?i khi thu?c, ha ch?t v khi lm tr?m tr?ng thm qu trnh h h?p c?a b?n.  Trnh ti?p xc v?i nh?ng ng??i c b?nh truy?n nhi?m.  Hessie Diener thay ??i nhi?t ?? v ?? ?m qu nhanh.  S? d?ng my t?o ?? ?m ? nh v ? c?nh gi??ng ng? c?a b?n n?u chng khng gy kh th?.  U?ng ?? n??c v dung d?ch ?? n??c ti?u trong ho?c c mu vng nh?t. ?i?u ny lm l?ng ch?t bi ti?t.  ?n th?c ?n c l?i cho s?c kh?e. ?n nhi?u b?a nh? h?n, th??ng xuyn h?n v ngh? ng?i tr??c khi ?n c th? gip b?n duy tr s?c b?n c?a b?n.  H?i chuyn gia ch?m [place] s?c kh?e c?a b?n v? vi?c s? d?ng vitamin v khong ch?t b? sung.  S?ng n?ng ??ng. T?p th? d?c v ho?t ??ng th? ch?t s? gip  duy tr kh? n?ng lm nh?ng ?i?u b?n mu?n lm.  Cn b?ng ho?t ??ng v?i th?i gian ngh? ng?i.  Tm m?t t? th? tho?i mi n?u b?n b? kh th?.  Tm hi?u v s? d?ng cc k? thu?t th? gin.  Tm hi?u v s? d?ng cc k? thu?t th? c ki?m sot theo ch? d?n c?a chuyn gia ch?m [place] s?c kh?e. K? thu?t th? c ki?m sot bao g?m:  Th? mm mi. K? thu?t th? ny b?t ??u v?i vi?c th? vo (ht) b?ng m?i trong 1 giy. Ti?p theo, mm mi nh? th? b?n ??nh hut so. Sau ? th? ra (thot kh) qua mi mm trong 2 giy.  Th? b?ng c? honh. B?t ??u b?ng cch ??t m?t tay ln b?ng, ngay trn th?t l?ng c?a b?n. Ht vo t? t? b?ng m?i. Tay trn b?ng c?n di chuy?n ra ngoi. Sau ? th? ra t? t? qua mi mm. B?n s? c th? c?m nh?n ???c tay trn b?ng di chuy?n trong khi b?n th? ra.  Tm hi?u v s? d?ng ho c ki?m sot ?? ??y h?t d?ch nh?y ra kh?i ph?i. Ho c ki?m sot l m?t lo?t ho ng?n, t?ng d?n. Cc b??c c?a ho c ki?m sot l: 1. H?i ci ??u v? pha tr??c. 2. Ht su s? d?ng th? b?ng c? honh. 3. C? g?ng nn th? trong 3 giy. 4. Lun ?? mi?ng h?i m? trong khi ho hai l?n. 5. Nh? m?i d?ch nh?y vo kh?n gi?y. 6. Ngh? ng?i v l?p l?i cc b??c ny m?t l?n ho?c hai l?n khi  c?n thi?t.  S? d?ng t?t c? cc lo?i v?cxin b?o v? ???c chuyn gia ch?m [place] s?c kh?e c?a b?n ?? xu?t, ??c bi?t l v?cxin ph? c?u khu?n v v?cxin cm.  H?c cch qu?n l c?ng th?ng.  Ln l?ch v tun th? m?i cu?c h?n khm l?i theo ch? d?n c?a chuyn gia ch?m [place] s?c kh?e. ?i?u quan tr?ng l b?n c?n tun theo t?t c? cc cu?c h?n.  Tham gia ph?c h?i ch?c n?ng ph?i theo ch? d?n c?a chuyn gia ch?m [place] s?c kh?e.  S? d?ng oxy t?i nh nh? ???c ?? xu?t. HY ?I KHM N?U:  B?n b? ho ra nhi?u d?ch nh?y h?n bnh th??ng.  C s? thay ??i v? mu s?c ho?c ?? ??c c?a d?ch nh?y.  Th? n?ng nh?c h?n bnh th??ng.  Th? nhanh h?n bnh th??ng.  Mu da c?a b?n xanh tm h?n bnh th??ng.  B?n h?t thu?c s? d?ng ?? ?i?u tr? v?n ?? h h?p c?a b?n.  B?n ?ang lo l?ng, s? hi hay b?n ch?n.  B?n b? s?t. HY NGAY L?P T?C ?I KHM N?U:  Tim ??p nhanh.  B?n b? kh th? trong khi ngh? ng?i.  B?n b? kh th? ??n m?c khng th? ni chuy?n ???c.  B?n b? kh d??c??n m?c khng th? th?c hi?n ???c nh?ng ho?t ??ng th? ch?t bnh th??ng.  B?n b? ?au ng?c ko di h?n 5 pht.  B?n b? c gi?t.  Gia ?nh ho?c b?n b c?a b?n nh?n th?y b?n b? kch ??ng ho?c l l?n. ??M B?O B?N:  Hi?u cc h??ng d?n ny.  S? theo di tnh tr?ng c?a mnh.  S? yu c?u tr? gip ngay l?p t?c n?u b?n c?m th?y khng ?? ho?c tnh tr?ng tr?m tr?ng h?n. Document Released: 03/10/2005 Document Revised: 01/31/2013 [facility] Patient Information 2014 [place], [place].

## 2013-06-20 NOTE — Progress Notes (Addendum)
ANTIBIOTIC CONSULT NOTE - FOLLOW UP  Pharmacy Consult for Ceftriaxone Indication: pneumonia  No Known Allergies  Patient Measurements: Height: 5\' 4"  (162.6 cm) Weight: 100 lb 11.2 oz (45.677 kg) IBW/kg (Calculated) : 59.2  Vital Signs: Temp: 98.2 F (36.8 C) (01/07 2778) Temp src: Oral (01/07 0608) BP: 120/69 mmHg (01/07 0608) Pulse Rate: 97 (01/07 0608) Intake/Output from previous day: 01/06 0701 - 01/07 0700 In: 240 [P.O.:240] Out: 225 [Urine:225] Intake/Output from this shift: Total I/O In: 240 [P.O.:240] Out: -   Labs:  Recent Labs  06/18/13 0406 06/19/13 0435 06/20/13 0506  WBC 14.7* 11.7* 10.1  HGB 13.0 11.9* 12.4*  PLT 187 179 173  CREATININE 0.76 0.86 0.84   Estimated Creatinine Clearance: 47.6 ml/min (by C-G formula based on Cr of 0.84).  Microbiology: 1/4 urine: >100,000 colonies/ml, multiple organisms 1/5 sputum: not a good specimen 1/4 influenza panel neg  Assessment: 71 yoM admitted with COPD exacerbation and possible pneumonia.  Day #4 Ceftriaxone 1g IV q24h and Azithromycin 500 mg IV q24h.  Tmax 99.1, WBC now WNL  Per progress notes, patient clinically improving  Goal of Therapy:  Eradication of infection  Plan:  No dose adjustments to antibiotics.  Will change azithromycin to PO since patient tolerating POs.  Pharmacy will sign off from protocol and follow peripherally.  Hershal Coria 06/20/2013,1:19 PM

## 2013-06-20 NOTE — Progress Notes (Signed)
Translator Y-Tin Huyng here, pt understands Home Health. Pt has no preference for Home Health. [facility] was selected.  Referral given to in house rep.

## 2013-06-20 NOTE — Discharge Summary (Signed)
Physician Discharge Summary  Nathaniel [name] [id number] DOB: Jul 05, 1933 DOA: 06/17/2013  PCP: [name], DO  Admit date: 06/17/2013 Discharge date: 06/20/2013  Recommendations for Outpatient Follow-up:   Please note that we have seen the recommendations from the diabetic coordinator but the concern with initiating basal insulin is the following: Patient is severely cachectic, malnourished, not eating enough and his sugars may drop below the target range even with small doses of basal insulin. For right now I would recommend continuing metformin and I have scheduled the appointment with primary care physician tomorrow 06/21/2013 at 4 PM and will defer to primary care physician whether they want to start basal insulin. A1c was 8.8 on this admission so he may be a candidate for additional anti-hyperglycemics by mouth such as tradjenta but per pharmacy this can only be prescribed on an outpatient basis by primary care physician.  Instructions: 1. take Levaquin 750 mg daily for 5 more days on discharge for pneumonia 2. continue nebulizer treatment as needed for wheezing or shortness of breath 3. order placed for DME nebulizer machine 4. order placed for home health physical therapy, occupational therapy, home health aide and a nurse 5. followup with primary care physician per scheduled appointment  Discharge Diagnoses:  Active Problems:   COPD exacerbation    Discharge Condition: medically stable for discharge home today with HHPT order in place  Diet recommendation: as tolerated, diabetic  History of present illness:  Pt is 80 yo male with COPD , DM , HTN who presented to [facility] ED with main concern of progressively worsening shortness of breath that started 6 hours prior to this admission. Pt speak little English and unable to provide history on admission. No reported fevers, chills, chest pain, no abdominal or urinary concerns. TRH was asked to admit for COPD exacerbation.   Assessment/Plan:    Active Problems:  COPD exacerbation with possible PNA  - pt clinically improving and maintaining oxygen saturation at target range  - received Zithromax and Rocephin in hospital; Levaquin on discharge as noted above - continue BD's scheduled and as needed, may stop steroids today Leukocytosis  - steroid induced  - d/c steroids today HTN  - reasonable inpatient control  - continue Lisinopril and HCTZ  HLD  - continue statin  DM  - continue SSI while inpatient  - A1C 8; please note the comment above in regards to diabetic recommendations and my recommendation for this pt related to adding insulin   Code Status: Full  Family Communication: Pt at bedside   Consultants:  None Studies:  Ct Chest Wo Contrast 06/17/2013 The appearance of the lungs suggests bronchitis, possibly with very early or mild multilobar bronchopneumonia involving LLL, RLL and RML. No frank lobar consolidation noted.  Dg Chest Port 1 View 06/17/2013 No acute cardiopulmonary process. Emphysema. Asymmetric density within the right hilum. ? adenopathy vs mass not entirely excluded. Further evaluation with cross-sectional imaging could be performed as indicated.  Antibiotics:  Zithromax 01/04 --> 06/20/13 Rocephin 01/04 --> 06/20/13  Signed:  Leisa Lenz, MD  [facility] 06/20/2013, 1:48 PM  Pager #: [phone]    Discharge Exam: Filed Vitals:   06/20/13 0608  BP: 120/69  Pulse: 97  Temp: 98.2 F (36.8 C)  Resp: 18   Filed Vitals:   06/19/13 2118 06/19/13 2248 06/20/13 0608 06/20/13 0723  BP: 118/67  120/69   Pulse: 96  97   Temp: 99.1 F (37.3 C)  98.2 F (36.8 C)   TempSrc: Oral  Oral   Resp: 18  18   Height:      Weight:   45.677 kg (100 lb 11.2 oz)   SpO2: 94% 97% 98% 91%    General: Pt is alert, follows commands appropriately, not in acute distress Cardiovascular: Regular rate and rhythm, S1/S2 +, no murmurs, no rubs, no gallops Respiratory: Clear to auscultation bilaterally, no  wheezing, no crackles, no rhonchi Abdominal: Soft, non tender, non distended, bowel sounds +, no guarding Extremities: no edema, no cyanosis, pulses palpable bilaterally DP and PT Neuro: Grossly nonfocal  Discharge Instructions  Discharge Orders   Future Orders Complete By Expires   Ambulatory referral to Nutrition and Diabetic Education  As directed    Call MD for:  difficulty breathing, headache or visual disturbances  As directed    Call MD for:  persistant dizziness or light-headedness  As directed    Call MD for:  persistant nausea and vomiting  As directed    Call MD for:  severe uncontrolled pain  As directed    Diet - low sodium heart healthy  As directed    Discharge instructions  As directed    Comments:     1. take Levaquin 750 mg daily for 5 more days on discharge for pneumonia 2. continue nebulizer treatment as needed for wheezing or shortness of breath 3. order placed for DME nebulizer machine 4. order placed for home health physical therapy, occupational therapy, home health aide and a nurse 5. followup with primary care physician per scheduled appointment   Increase activity slowly  As directed        Medication List    STOP taking these medications       omeprazole 20 MG capsule  Commonly known as:  PRILOSEC  Replaced by:  pantoprazole 40 MG tablet     pravastatin 40 MG tablet  Commonly known as:  PRAVACHOL  Replaced by:  simvastatin 20 MG tablet      TAKE these medications       albuterol 108 (90 BASE) MCG/ACT inhaler  Commonly known as:  VENTOLIN HFA  Inhale 2 puffs into the lungs every 4 (four) hours while awake.     albuterol (2.5 MG/3ML) 0.083% nebulizer solution  Commonly known as:  PROVENTIL  Take 3 mLs (2.5 mg total) by nebulization every 4 (four) hours as needed for wheezing or shortness of breath.     finasteride 5 MG tablet  Commonly known as:  PROSCAR  Take 1 tablet (5 mg total) by mouth daily.     Fluticasone-Salmeterol 500-50  MCG/DOSE Aepb  Commonly known as:  ADVAIR  Inhale 1 puff into the lungs 2 (two) times daily.     gabapentin 100 MG capsule  Commonly known as:  NEURONTIN  Take 200 mg by mouth 3 (three) times daily.     HYDROcodone-acetaminophen 5-325 MG per tablet  Commonly known as:  NORCO/VICODIN  Take 1-2 tablets by mouth every 4 (four) hours as needed for moderate pain.     levofloxacin 750 MG tablet  Commonly known as:  LEVAQUIN  Take 1 tablet (750 mg total) by mouth daily.     metFORMIN 500 MG tablet  Commonly known as:  GLUCOPHAGE  Take 1 tablet (500 mg total) by mouth 2 (two) times daily with a meal. 1 tablet by mouth twice daily for diabetes     montelukast 10 MG tablet  Commonly known as:  SINGULAIR  Take 1 tablet (10 mg total) by mouth at  bedtime.     pantoprazole 40 MG tablet  Commonly known as:  PROTONIX  Take 1 tablet (40 mg total) by mouth daily.     polyethylene glycol packet  Commonly known as:  MIRALAX / GLYCOLAX  Take 17 g by mouth daily as needed for mild constipation.     simvastatin 20 MG tablet  Commonly known as:  ZOCOR  Take 1 tablet (20 mg total) by mouth daily at 6 PM.     tamsulosin 0.4 MG Caps capsule  Commonly known as:  FLOMAX  Take 2 capsules (0.8 mg total) by mouth daily after supper.     tiotropium 18 MCG inhalation capsule  Commonly known as:  SPIRIVA  Place 1 capsule (18 mcg total) into inhaler and inhale daily.           Follow-up Information   Follow up with LE, [place], DO On 06/21/2013. (4 pm)    Specialty:  Family Medicine   Contact information:   [address] [place] [id number] [phone]        The results of significant diagnostics from this hospitalization (including imaging, microbiology, ancillary and laboratory) are listed below for reference.    Significant Diagnostic Studies: Ct Chest Wo Contrast  06/17/2013   CLINICAL DATA:  Hypoxia.  History of COPD.  EXAM: CT CHEST WITHOUT CONTRAST  TECHNIQUE: Multidetector  CT imaging of the chest was performed following the standard protocol without IV contrast.  COMPARISON:  Chest CT 03/11/2009.  FINDINGS: Mediastinum: Heart size is normal. There is no significant pericardial fluid, thickening or pericardial calcification. No pathologically enlarged mediastinal or hilar lymph nodes. Please note that accurate exclusion of hilar adenopathy is limited on noncontrast CT scans. Esophagus is unremarkable in appearance.  Lungs/Pleura: Mild diffuse bronchial wall thickening with patchy areas of more severe peribronchovascular interstitial thickening, most pronounced in the left lower lobe, right upper lobe and right middle lobe. In these same areas there are some subtle peribronchovascular areas of ground-glass attenuation micronodularity, likely to represent foci of active inflammation/ infection. No frank confluent consolidative airspace disease. No pleural effusions.  Upper Abdomen: Multiple low-attenuation hepatic lesions are incompletely characterized on today's non contrast CT examination, but are similar in size, number and distribution compared to the prior study 03/11/2009, presumably cysts; the largest of these measures 12 mm in the posterior aspect of segment 7.  Musculoskeletal: There are no aggressive appearing lytic or blastic lesions noted in the visualized portions of the skeleton.  IMPRESSION: 1. The appearance of the lungs suggests bronchitis, possibly with very early or mild multilobar bronchopneumonia involving the left lower lobe, right upper lobe and right middle lobe, as discussed above. No frank lobar consolidation is noted at this time. 2. Additional incidental findings, as above.   Electronically Signed   By: Vinnie Langton M.D.   On: 06/17/2013 09:15   Dg Chest Port 1 View  06/17/2013   CLINICAL DATA:  Shortness of breath, respiratory distress  EXAM: PORTABLE CHEST - 1 VIEW  COMPARISON:  Prior radiograph from 01/30/2013  FINDINGS: Mild asymmetric prominence of  the right hilum is similar as compared to the prior exam. Cardiac and mediastinal silhouettes are unchanged.  Lung volumes are mildly increased with attenuation of the pulmonary markings, suggestive of COPD. No focal infiltrate, pulmonary edema, or pleural effusion. No pneumothorax.  No acute osseous abnormality.  IMPRESSION: 1. No acute cardiopulmonary process. 2. Emphysema. 3. Asymmetric density within the right hilum, more prominent as compared to the prior  exam. While this finding may be secondary to overlapping/ superimposed shadows, possible adenopathy or mass is not entirely excluded. Further evaluation with cross-sectional imaging could be performed as clinically indicated.   Electronically Signed   By: Jeannine Boga M.D.   On: 06/17/2013 06:28    Microbiology: Recent Results (from the past 240 hour(s))  MRSA PCR SCREENING     Status: None   Collection Time    06/17/13  9:35 AM      Result Value Range Status   MRSA by PCR NEGATIVE  NEGATIVE Final   Comment:            The GeneXpert MRSA Assay (FDA     approved for NASAL specimens     only), is one component of a     comprehensive MRSA colonization     surveillance program. It is not     intended to diagnose MRSA     infection nor to guide or     monitor treatment for     MRSA infections.  URINE CULTURE     Status: None   Collection Time    06/17/13 11:35 AM      Result Value Range Status   Specimen Description URINE, CLEAN CATCH   Final   Special Requests NONE   Final   Culture  Setup Time     Final   Value: 06/17/2013 19:03     Performed at [place]     Final   Value: >=100,000 COLONIES/ML     Performed at [facility]   Culture     Final   Value: Multiple bacterial morphotypes present, none predominant. Suggest appropriate recollection if clinically indicated.     Performed at [facility]   Report Status 06/19/2013 FINAL   Final  CULTURE, EXPECTORATED SPUTUM-ASSESSMENT      Status: None   Collection Time    06/18/13  8:14 AM      Result Value Range Status   Specimen Description SPUTUM   Final   Special Requests Normal   Final   Sputum evaluation     Final   Value: MICROSCOPIC FINDINGS SUGGEST THAT THIS SPECIMEN IS NOT REPRESENTATIVE OF LOWER RESPIRATORY SECRETIONS. PLEASE RECOLLECT.     CALLED TO C KEATTS RN A [phone] ON 01.05.15 BY SHUEA   Report Status 06/18/2013 FINAL   Final     Labs: Basic Metabolic Panel:  Recent Labs Lab 06/17/13 0540 06/17/13 0850 06/18/13 0406 06/19/13 0435 06/20/13 0506  NA 143  --  137 139 139  K 3.5*  --  4.2 3.6* 4.2  CL 104  --  100 101 100  CO2 26  --  24 26 28   GLUCOSE 198*  --  188* 198* 173*  BUN 22  --  26* 38* 31*  CREATININE 0.71 0.70 0.76 0.86 0.84  CALCIUM 8.7  --  8.6 8.4 8.6   Liver Function Tests:  Recent Labs Lab 06/17/13 0540  AST 15  ALT 11  ALKPHOS 81  BILITOT 0.3  PROT 7.9  ALBUMIN 4.2   No results found for this basename: LIPASE, AMYLASE,  in the last 168 hours No results found for this basename: AMMONIA,  in the last 168 hours CBC:  Recent Labs Lab 06/17/13 0540 06/17/13 0850 06/18/13 0406 06/19/13 0435 06/20/13 0506  WBC 8.2 10.0 14.7* 11.7* 10.1  NEUTROABS 5.4  --   --   --   --  HGB 13.9 13.1 13.0 11.9* 12.4*  HCT 44.4 40.7 38.8* 35.4* 38.4*  MCV 88.6 88.7 87.4 86.1 87.3  PLT 197 163 187 179 173   Cardiac Enzymes:  Recent Labs Lab 06/17/13 0540  TROPONINI <0.30   BNP: BNP (last 3 results)  Recent Labs  01/30/13 2130 06/17/13 0540  PROBNP 143.4 77.6   CBG:  Recent Labs Lab 06/19/13 1131 06/19/13 1620 06/19/13 2149 06/20/13 0724 06/20/13 1146  GLUCAP 201* 212* 189* 147* 214*    Time coordinating discharge: Over 30 minutes

## 2013-06-20 NOTE — Progress Notes (Signed)
Translator thoroughly went through d/c instructions w/ pt, pt w/no questions.  Waiting on son to pick pt up, and will explain orders to him also

## 2013-06-20 NOTE — Progress Notes (Signed)
Inpatient Diabetes Program Recommendations  AACE/ADA: New Consensus Statement on Inpatient Glycemic Control (2013)  Target Ranges:  Prepandial:   less than 140 mg/dL      Peak postprandial:   less than 180 mg/dL (1-2 hours)      Critically ill patients:  140 - 180 mg/dL   Reason for Visit: Hyperglycemia  Results for LOYS, SHUGARS (MRN [id number]) as of 06/20/2013 11:06  Ref. Range 01/30/2013 20:42 06/17/2013 08:50  Hemoglobin A1C Latest Range: <5.7 % 7.1 (H) 8.0 (H)   Results for MAUDE, GLOOR (MRN [id number]) as of 06/20/2013 11:06  Ref. Range 06/19/2013 07:30 06/19/2013 11:31 06/19/2013 16:20 06/19/2013 21:49 06/20/2013 07:24  Glucose-Capillary Latest Range: 70-99 mg/dL 168 (H) 201 (H) 212 (H) 189 (H) 147 (H)    Steroids tapering down.  HgbA1C results indicate poor glycemic control at home.  Will need to f/u with PCP for management of diabetes. Will likely need low dose basal insulin or OHA added to metformin therapy.  Will continue to follow while inpatient. Will order OP Diabetes Education consult for HgbA1C of 8.0%.  Thank you. Lorenda Peck, RD, LDN, CDE Inpatient Diabetes Coordinator [phone]

## 2013-06-20 NOTE — Evaluation (Signed)
Physical Therapy Evaluation Patient Details Name: Nathaniel [name] MRN: [id number] DOB: [birth year] Today's Date: 06/20/2013 Time: 1410-1440 PT Time Calculation (min): 30 min  PT Assessment / Plan / Recommendation History of Present Illness    Pt is 80 yo male with COPD , DM , HTN who presented to [facility] ED with main concern of progressively worsening shortness of breath that started 6 hours prior to this admission. Pt speak little English and unable to provide history on admission. No reported fevers, chills, chest pain, no abdominal or urinary concerns. TRH was asked to admit for COPD exacerbation.      Clinical Impression  Pt with hx as above and eager for d/c home presents with functional mobility limitations 2* mild ambulatory balance deficits.  Pt would benefit from follow up HHPT to maximize safety and IND.    PT Assessment  Patient needs continued PT services    Follow Up Recommendations  Home health PT    Does the patient have the potential to tolerate intense rehabilitation      Barriers to Discharge        Equipment Recommendations  None recommended by PT    Recommendations for Other Services     Frequency      Precautions / Restrictions Precautions Precautions: Fall Restrictions Weight Bearing Restrictions: No   Pertinent Vitals/Pain       Mobility  Bed Mobility Overal bed mobility: Independent Transfers Overall transfer level: Independent Equipment used: None Ambulation/Gait Ambulation/Gait assistance: Min Dispensing optician (Feet): 400 Feet Assistive device: Rolling walker (2 wheeled);None Gait Pattern/deviations: Decreased stance time - left;Decreased step length - right;Shuffle;Drifts right/left;Antalgic General Gait Details: Pt demonstrates instability to R with gait.  Attempted use of RW for stability - placed pt at higher risk of fall 2* misuse and inability to translate     Exercises     PT Diagnosis: Difficulty walking  PT Problem  List: Decreased activity tolerance;Decreased balance;Decreased mobility;Decreased knowledge of use of DME PT Treatment Interventions:       PT Goals(Current goals can be found in the care plan section) Acute Rehab PT Goals Patient Stated Goal: go home PT Goal Formulation: No goals set, d/c therapy (pt to d/c home)  Visit Information  Last PT Received On: 06/20/13 Assistance Needed: +1       Prior [place] expects to be discharged to:: Private residence Living Arrangements: Alone Available Help at Discharge: Other (Comment) Type of Home: Apartment Home Layout: One level Home Equipment: None Additional Comments: Pt with limited English and ltd success with telephone tranlator  Prior Function Level of Independence: Independent Communication Communication: Prefers language other than English;Interpreter utilized Conservation officer, nature with interpreter 2* different dialects)    Cognition  Cognition Arousal/Alertness: Awake/alert Behavior During Therapy: WFL for tasks assessed/performed Overall Cognitive Status: Within Functional Limits for tasks assessed    Extremity/Trunk Assessment Upper Extremity Assessment Upper Extremity Assessment: Overall WFL for tasks assessed Lower Extremity Assessment Lower Extremity Assessment: Overall WFL for tasks assessed   Balance Balance Overall balance assessment: History of Falls (Pt admits to falls - ? understanding question)  End of Session PT - End of Session Equipment Utilized During Treatment: Gait belt Activity Tolerance: Patient limited by fatigue;Patient tolerated treatment well Patient left: in chair;with call bell/phone within reach Nurse Communication: Mobility status  GP     Nathaniel [name] 06/20/2013, 5:33 PM

## 2013-06-21 ENCOUNTER — Ambulatory Visit (INDEPENDENT_AMBULATORY_CARE_PROVIDER_SITE_OTHER): Payer: 59 | Admitting: Family Medicine

## 2013-06-21 VITALS — BP 134/78 | HR 110 | Temp 97.5°F | Resp 18 | Ht 62.0 in | Wt 105.0 lb

## 2013-06-21 DIAGNOSIS — R059 Cough, unspecified: Secondary | ICD-10-CM

## 2013-06-21 DIAGNOSIS — R062 Wheezing: Secondary | ICD-10-CM

## 2013-06-21 DIAGNOSIS — R05 Cough: Secondary | ICD-10-CM

## 2013-06-21 DIAGNOSIS — J441 Chronic obstructive pulmonary disease with (acute) exacerbation: Secondary | ICD-10-CM

## 2013-06-21 DIAGNOSIS — K5900 Constipation, unspecified: Secondary | ICD-10-CM

## 2013-06-21 DIAGNOSIS — Z09 Encounter for follow-up examination after completed treatment for conditions other than malignant neoplasm: Secondary | ICD-10-CM

## 2013-06-21 MED ORDER — HYDROCODONE-HOMATROPINE 5-1.5 MG/5ML PO SYRP: 5.0000 mL | ORAL_SOLUTION | Freq: Every evening | ORAL | Status: DC | PRN

## 2013-06-21 MED ORDER — POLYETHYLENE GLYCOL 3350 17 GM/SCOOP PO POWD: 17.0000 g | Freq: Two times a day (BID) | ORAL | Status: AC | PRN

## 2013-06-21 MED ORDER — BENZONATATE 100 MG PO CAPS: 200.0000 mg | ORAL_CAPSULE | Freq: Two times a day (BID) | ORAL | Status: DC | PRN

## 2013-06-21 NOTE — Progress Notes (Deleted)
   Subjective:    Patient ID: Nathaniel [name], male    DOB: 01-06-1934, 80 y.o.   MRN: [id number]  HPI  80 YO male patient comes to the clinic today to follow up from a recent hospital visit. He was admitted for COPD exacerbation.   Pt reports that today he is breathing hard and has a cough that causes pain in his chest muscles. There is no pain when he is not coughing. He reports that he is not sleeping at all due to the cough. DOE and SOB without exertion.    Review of Systems     Objective:   Physical Exam        Assessment & Plan:

## 2013-06-21 NOTE — Progress Notes (Signed)
Chief Complaint:  Chief Complaint  Patient presents with  . Follow-up    hosp visit-COPD, wheezing, SOB, cough    HPI: Nathaniel [name] is a thin elderly  80 y.o.[place] widowed male with COPD emphysema, HTN, Hyperlipidemia, Diabetes last HbA1c was around 8.0,   who is here for follow up from a recent hospital visit. He was admitted for COPD exacerbation  With bronchitis/possible CAP  from 1/4-06/20/2013. He was discharged with Levaquin x 5 more days  He went int o the ER after experiencing SOB and wheezing about 6 prior to ER admission. He was given his home meds, oxygen,  and also PO steroids, Azithromycin and Rocephin for empiric coverage of CAP which was seen as possible early PNA on CT chest. His flu test were negative, his HbA1c was 8 ( normally he is around 7-7.7). He is only on metformin, diabetes education suggested tighter glycemic control with insulin however internist was concerned that discharging him on insulin would bottom him out and cause hypoglycemic issues since he is already cachetic. The patient lives alone in a subsidized apt. His children are in town, he again has been widowed for many years.   Pt reports that today he is breathing hard and has a cough that causes pain in his chest muscles. There is no pain when he is not coughing. He reports that he is not sleeping at all due to the cough. DOE and SOB without exertion only with coughing. His breathing is better , not worse than when he was hospitalized. He denies fevers, he has pleuritic CP due to cough  and he can't sleep, has only slept 2 years He received his neb machine yesterday late in the evening and has only used it once.   He is currently still on Levaquin and his other home meds.    CT CHEST FINDINGS:  Mediastinum: Heart size is normal. There is no significant  pericardial fluid, thickening or pericardial calcification. No  pathologically enlarged mediastinal or hilar lymph nodes. Please  note that accurate  exclusion of hilar adenopathy is limited on  noncontrast CT scans. Esophagus is unremarkable in appearance.  Lungs/Pleura: Mild diffuse bronchial wall thickening with patchy  areas of more severe peribronchovascular interstitial thickening,  most pronounced in the left lower lobe, right upper lobe and right  middle lobe. In these same areas there are some subtle  peribronchovascular areas of ground-glass attenuation  micronodularity, likely to represent foci of active inflammation/  infection. No frank confluent consolidative airspace disease. No  pleural effusions.  Upper Abdomen: Multiple low-attenuation hepatic lesions are  incompletely characterized on today's non contrast CT examination,  but are similar in size, number and distribution compared to the  prior study 03/11/2009, presumably cysts; the largest of these  measures 12 mm in the posterior aspect of segment 7.  Musculoskeletal: There are no aggressive appearing lytic or blastic  lesions noted in the visualized portions of the skeleton.  IMPRESSION:  1. The appearance of the lungs suggests bronchitis, possibly with  very early or mild multilobar bronchopneumonia involving the left  lower lobe, right upper lobe and right middle lobe, as discussed  above. No frank lobar consolidation is noted at this time.  2. Additional incidental findings, as above.  Electronically Signed  By: Vinnie Langton M.D.  On: 06/17/2013 09:15  Discharge Instructions Comments:  1. take Levaquin 750 mg daily for 5 more days on discharge for pneumonia  2. continue nebulizer treatment as needed for  wheezing or shortness of breath  3. order placed for DME nebulizer machine  4. order placed for home health physical therapy, occupational therapy, home health aide and a nurse  5. followup with primary care physician per scheduled appointment  Increase activity slowly  As directed    Past Medical History  Diagnosis Date  . Hyperlipidemia   .  Hypertension   . Diabetic neuropathy     /H&P 01/30/2013 (01/30/2013)  . Diabetes mellitus   . BPH (benign prostatic hyperplasia)     /H&P 01/30/2013 (01/30/2013)  . Emphysematous cystitis     /H&P 01/30/2013 (01/30/2013)  . COPD (chronic obstructive pulmonary disease)   . Orthopnoea    Past Surgical History  Procedure Laterality Date  . No past surgeries     History   Social History  . Marital Status: Widowed    Spouse Name: N/A    Number of Children: N/A  . Years of Education: N/A   Social History Main Topics  . Smoking status: Former Smoker -- 0.25 packs/day for 42 years    Types: Cigarettes  . Smokeless tobacco: Never Used     Comment: 01/30/2013 "quit smoking ~ 10 yr ago"  . Alcohol Use: No  . Drug Use: No  . Sexual Activity: Not Currently   Other Topics Concern  . None   Social History Narrative  . None   No family history on file. No Known Allergies Prior to Admission medications   Medication Sig Start Date End Date Taking? Authorizing Provider  albuterol (PROVENTIL) (2.5 MG/3ML) 0.083% nebulizer solution Take 3 mLs (2.5 mg total) by nebulization every 4 (four) hours as needed for wheezing or shortness of breath. 06/20/13  Yes Robbie Lis, MD  albuterol (VENTOLIN HFA) 108 (90 BASE) MCG/ACT inhaler Inhale 2 puffs into the lungs every 4 (four) hours while awake. 02/09/13  Yes Ellison Carwin, MD  finasteride (PROSCAR) 5 MG tablet Take 1 tablet (5 mg total) by mouth daily. 06/20/13  Yes Robbie Lis, MD  Fluticasone-Salmeterol (ADVAIR) 500-50 MCG/DOSE AEPB Inhale 1 puff into the lungs 2 (two) times daily. 02/09/13  Yes Ellison Carwin, MD  gabapentin (NEURONTIN) 100 MG capsule Take 200 mg by mouth 3 (three) times daily. 11/06/12 11/06/13 Yes Waldemar Dickens, MD  HYDROcodone-acetaminophen (NORCO/VICODIN) 5-325 MG per tablet Take 1-2 tablets by mouth every 4 (four) hours as needed for moderate pain. 06/20/13  Yes Robbie Lis, MD  levofloxacin (LEVAQUIN) 750 MG tablet Take 1  tablet (750 mg total) by mouth daily. 06/20/13  Yes Robbie Lis, MD  metFORMIN (GLUCOPHAGE) 500 MG tablet Take 1 tablet (500 mg total) by mouth 2 (two) times daily with a meal. 1 tablet by mouth twice daily for diabetes 09/16/12  Yes Posey Boyer, MD  montelukast (SINGULAIR) 10 MG tablet Take 1 tablet (10 mg total) by mouth at bedtime. 06/20/13  Yes Robbie Lis, MD  pantoprazole (PROTONIX) 40 MG tablet Take 1 tablet (40 mg total) by mouth daily. 06/20/13  Yes Robbie Lis, MD  polyethylene glycol [facility] / Floria Raveling) packet Take 17 g by mouth daily as needed for mild constipation. 06/20/13  Yes Robbie Lis, MD  simvastatin (ZOCOR) 20 MG tablet Take 1 tablet (20 mg total) by mouth daily at 6 PM. 06/20/13  Yes Robbie Lis, MD  tamsulosin (FLOMAX) 0.4 MG CAPS capsule Take 2 capsules (0.8 mg total) by mouth daily after supper. 06/20/13  Yes Robbie Lis, MD  tiotropium [facility])  18 MCG inhalation capsule Place 1 capsule (18 mcg total) into inhaler and inhale daily. 06/20/13  Yes Robbie Lis, MD     ROS: The patient denies fevers, chills, night sweats, unintentional weight loss, chest pain, palpitations, nausea, vomiting, abdominal pain, dysuria, hematuria, melena, numbness, weakness, or tingling.  All other systems have been reviewed and were otherwise negative with the exception of those mentioned in the HPI and as above.    PHYSICAL EXAM: Filed Vitals:   06/21/13 1641  BP: 134/78  Pulse: 110  Temp: 97.5 F (36.4 C)  Resp: 24  Resp Rate is not 24---it is more like 16-18 Spo2 is 95% Filed Vitals:   06/21/13 1641  Height: 5\' 2"  (1.575 m)  Weight: 105 lb (47.628 kg)   Body mass index is 19.2 kg/(m^2).  General: Alert, no acute distress, thin Asian male , No increase WOB HEENT:  Normocephalic, atraumatic, oropharynx patent. EOMI, PERRLA Cardiovascular:  Regular rate and rhythm, no rubs murmurs or gallops.  No Carotid bruits, radial pulse intact. No pedal edema.  Respiratory: Clear to  auscultation bilaterally.  + minimal exp wheezes, no appreciable rales, or rhonchi.  No cyanosis, no use of accessory musculature GI: No organomegaly, abdomen is soft and non-tender, positive bowel sounds.  No masses. Skin: No rashes. Neurologic: Facial musculature symmetric. Psychiatric: Patient is appropriate throughout our interaction. Lymphatic: No cervical lymphadenopathy Musculoskeletal: Gait intact.   LABS: Results for orders placed during the hospital encounter of 06/17/13  CULTURE, EXPECTORATED SPUTUM-ASSESSMENT      Result Value Range   Specimen Description SPUTUM     Special Requests Normal     Sputum evaluation       Value: MICROSCOPIC FINDINGS SUGGEST THAT THIS SPECIMEN IS NOT REPRESENTATIVE OF LOWER RESPIRATORY SECRETIONS. PLEASE RECOLLECT.     CALLED TO C KEATTS RN A [id number] ON 01.05.15 BY SHUEA   Report Status 06/18/2013 FINAL    MRSA PCR SCREENING      Result Value Range   MRSA by PCR NEGATIVE  NEGATIVE  URINE CULTURE      Result Value Range   Specimen Description URINE, CLEAN CATCH     Special Requests NONE     Culture  Setup Time       Value: 06/17/2013 19:03     Performed at [facility] Count       Value: >=100,000 COLONIES/ML     Performed at [facility]   Culture       Value: Multiple bacterial morphotypes present, none predominant. Suggest appropriate recollection if clinically indicated.     Performed at [facility]   Report Status 06/19/2013 FINAL    CBC WITH DIFFERENTIAL      Result Value Range   WBC 8.2  4.0 - 10.5 K/uL   RBC 5.01  4.22 - 5.81 MIL/uL   Hemoglobin 13.9  13.0 - 17.0 g/dL   HCT 44.4  39.0 - 52.0 %   MCV 88.6  78.0 - 100.0 fL   MCH 27.7  26.0 - 34.0 pg   MCHC 31.3  30.0 - 36.0 g/dL   RDW 13.3  11.5 - 15.5 %   Platelets 197  150 - 400 K/uL   Neutrophils Relative % 66  43 - 77 %   Neutro Abs 5.4  1.7 - 7.7 K/uL   Lymphocytes Relative 20  12 - 46 %   Lymphs Abs 1.6  0.7 - 4.0 K/uL   Monocytes  Relative  3  3 - 12 %   Monocytes Absolute 0.3  0.1 - 1.0 K/uL   Eosinophils Relative 11 (*) 0 - 5 %   Eosinophils Absolute 0.9 (*) 0.0 - 0.7 K/uL   Basophils Relative 1  0 - 1 %   Basophils Absolute 0.0  0.0 - 0.1 K/uL  COMPREHENSIVE METABOLIC PANEL      Result Value Range   Sodium 143  137 - 147 mEq/L   Potassium 3.5 (*) 3.7 - 5.3 mEq/L   Chloride 104  96 - 112 mEq/L   CO2 26  19 - 32 mEq/L   Glucose, Bld 198 (*) 70 - 99 mg/dL   BUN 22  6 - 23 mg/dL   Creatinine, Ser 0.71  0.50 - 1.35 mg/dL   Calcium 8.7  8.4 - 10.5 mg/dL   Total Protein 7.9  6.0 - 8.3 g/dL   Albumin 4.2  3.5 - 5.2 g/dL   AST 15  0 - 37 U/L   ALT 11  0 - 53 U/L   Alkaline Phosphatase 81  39 - 117 U/L   Total Bilirubin 0.3  0.3 - 1.2 mg/dL   GFR calc non Af Amer 88 (*) >90 mL/min   GFR calc Af Amer >90  >90 mL/min  TROPONIN I      Result Value Range   Troponin I <0.30  <0.30 ng/mL  BLOOD GAS, ARTERIAL      Result Value Range   FIO2 1.00     Delivery systems BILEVEL POSITIVE AIRWAY PRESSURE     Inspiratory PAP 10     Expiratory PAP 5     pH, Arterial 7.298 (*) 7.350 - 7.450   pCO2 arterial 54.4 (*) 35.0 - 45.0 mmHg   pO2, Arterial 359.0 (*) 80.0 - 100.0 mmHg   Bicarbonate 25.8 (*) 20.0 - 24.0 mEq/L   TCO2 23.2  0 - 100 mmol/L   Acid-base deficit 1.1  0.0 - 2.0 mmol/L   O2 Saturation 99.3     Patient temperature 98.6     Collection site LEFT BRACHIAL     Drawn by [id number]     Sample type ARTERIAL DRAW    PRO B NATRIURETIC PEPTIDE      Result Value Range   Pro B Natriuretic peptide (BNP) 77.6  0 - 450 pg/mL  URINALYSIS, ROUTINE W REFLEX MICROSCOPIC      Result Value Range   Color, Urine YELLOW  YELLOW   APPearance TURBID (*) CLEAR   Specific Gravity, Urine 1.023  1.005 - 1.030   pH 7.0  5.0 - 8.0   Glucose, UA >1000 (*) NEGATIVE mg/dL   Hgb urine dipstick SMALL (*) NEGATIVE   Bilirubin Urine NEGATIVE  NEGATIVE   Ketones, ur 15 (*) NEGATIVE mg/dL   Protein, ur NEGATIVE  NEGATIVE mg/dL    Urobilinogen, UA 0.2  0.0 - 1.0 mg/dL   Nitrite POSITIVE (*) NEGATIVE   Leukocytes, UA LARGE (*) NEGATIVE  CBC      Result Value Range   WBC 10.0  4.0 - 10.5 K/uL   RBC 4.59  4.22 - 5.81 MIL/uL   Hemoglobin 13.1  13.0 - 17.0 g/dL   HCT 40.7  39.0 - 52.0 %   MCV 88.7  78.0 - 100.0 fL   MCH 28.5  26.0 - 34.0 pg   MCHC 32.2  30.0 - 36.0 g/dL   RDW 13.3  11.5 - 15.5 %   Platelets 163  150 - 400 K/uL  CREATININE, SERUM      Result Value Range   Creatinine, Ser 0.70  0.50 - 1.35 mg/dL   GFR calc non Af Amer 89 (*) >90 mL/min   GFR calc Af Amer >90  >90 mL/min  HEMOGLOBIN A1C      Result Value Range   Hemoglobin A1C 8.0 (*) <5.7 %   Mean Plasma Glucose 183 (*) <117 mg/dL  INFLUENZA PANEL BY PCR      Result Value Range   Influenza A By PCR NEGATIVE  NEGATIVE   Influenza B By PCR NEGATIVE  NEGATIVE   H1N1 flu by pcr NOT DETECTED  NOT DETECTED  URINE MICROSCOPIC-ADD ON      Result Value Range   WBC, UA TOO NUMEROUS TO COUNT  <3 WBC/hpf   Bacteria, UA MANY (*) RARE  GLUCOSE, CAPILLARY      Result Value Range   Glucose-Capillary 307 (*) 70 - 99 mg/dL  CBC      Result Value Range   WBC 14.7 (*) 4.0 - 10.5 K/uL   RBC 4.44  4.22 - 5.81 MIL/uL   Hemoglobin 13.0  13.0 - 17.0 g/dL   HCT 38.8 (*) 39.0 - 52.0 %   MCV 87.4  78.0 - 100.0 fL   MCH 29.3  26.0 - 34.0 pg   MCHC 33.5  30.0 - 36.0 g/dL   RDW 13.2  11.5 - 15.5 %   Platelets 187  150 - 400 K/uL  BASIC METABOLIC PANEL      Result Value Range   Sodium 137  137 - 147 mEq/L   Potassium 4.2  3.7 - 5.3 mEq/L   Chloride 100  96 - 112 mEq/L   CO2 24  19 - 32 mEq/L   Glucose, Bld 188 (*) 70 - 99 mg/dL   BUN 26 (*) 6 - 23 mg/dL   Creatinine, Ser 0.76  0.50 - 1.35 mg/dL   Calcium 8.6  8.4 - 10.5 mg/dL   GFR calc non Af Amer 86 (*) >90 mL/min   GFR calc Af Amer >90  >90 mL/min  GLUCOSE, CAPILLARY      Result Value Range   Glucose-Capillary 76  70 - 99 mg/dL  GLUCOSE, CAPILLARY      Result Value Range   Glucose-Capillary 289 (*)  70 - 99 mg/dL   Comment 1 Documented in Chart     Comment 2 Notify RN    GLUCOSE, CAPILLARY      Result Value Range   Glucose-Capillary 260 (*) 70 - 99 mg/dL  GLUCOSE, CAPILLARY      Result Value Range   Glucose-Capillary 183 (*) 70 - 99 mg/dL   Comment 1 Documented in Chart     Comment 2 Notify RN    GLUCOSE, CAPILLARY      Result Value Range   Glucose-Capillary 176 (*) 70 - 99 mg/dL   Comment 1 Documented in Chart     Comment 2 Notify RN    CBC      Result Value Range   WBC 11.7 (*) 4.0 - 10.5 K/uL   RBC 4.11 (*) 4.22 - 5.81 MIL/uL   Hemoglobin 11.9 (*) 13.0 - 17.0 g/dL   HCT 35.4 (*) 39.0 - 52.0 %   MCV 86.1  78.0 - 100.0 fL   MCH 29.0  26.0 - 34.0 pg   MCHC 33.6  30.0 - 36.0 g/dL   RDW 13.3  11.5 - 15.5 %   Platelets 179  150 -  400 K/uL  BASIC METABOLIC PANEL      Result Value Range   Sodium 139  137 - 147 mEq/L   Potassium 3.6 (*) 3.7 - 5.3 mEq/L   Chloride 101  96 - 112 mEq/L   CO2 26  19 - 32 mEq/L   Glucose, Bld 198 (*) 70 - 99 mg/dL   BUN 38 (*) 6 - 23 mg/dL   Creatinine, Ser 0.86  0.50 - 1.35 mg/dL   Calcium 8.4  8.4 - 10.5 mg/dL   GFR calc non Af Amer 82 (*) >90 mL/min   GFR calc Af Amer >90  >90 mL/min  GLUCOSE, CAPILLARY      Result Value Range   Glucose-Capillary 293 (*) 70 - 99 mg/dL  GLUCOSE, CAPILLARY      Result Value Range   Glucose-Capillary 193 (*) 70 - 99 mg/dL  GLUCOSE, CAPILLARY      Result Value Range   Glucose-Capillary 168 (*) 70 - 99 mg/dL  GLUCOSE, CAPILLARY      Result Value Range   Glucose-Capillary 201 (*) 70 - 99 mg/dL  GLUCOSE, CAPILLARY      Result Value Range   Glucose-Capillary 212 (*) 70 - 99 mg/dL  CBC      Result Value Range   WBC 10.1  4.0 - 10.5 K/uL   RBC 4.40  4.22 - 5.81 MIL/uL   Hemoglobin 12.4 (*) 13.0 - 17.0 g/dL   HCT 38.4 (*) 39.0 - 52.0 %   MCV 87.3  78.0 - 100.0 fL   MCH 28.2  26.0 - 34.0 pg   MCHC 32.3  30.0 - 36.0 g/dL   RDW 13.2  11.5 - 15.5 %   Platelets 173  150 - 400 K/uL  BASIC METABOLIC PANEL        Result Value Range   Sodium 139  137 - 147 mEq/L   Potassium 4.2  3.7 - 5.3 mEq/L   Chloride 100  96 - 112 mEq/L   CO2 28  19 - 32 mEq/L   Glucose, Bld 173 (*) 70 - 99 mg/dL   BUN 31 (*) 6 - 23 mg/dL   Creatinine, Ser 0.84  0.50 - 1.35 mg/dL   Calcium 8.6  8.4 - 10.5 mg/dL   GFR calc non Af Amer 82 (*) >90 mL/min   GFR calc Af Amer >90  >90 mL/min  GLUCOSE, CAPILLARY      Result Value Range   Glucose-Capillary 189 (*) 70 - 99 mg/dL  GLUCOSE, CAPILLARY      Result Value Range   Glucose-Capillary 147 (*) 70 - 99 mg/dL  GLUCOSE, CAPILLARY      Result Value Range   Glucose-Capillary 214 (*) 70 - 99 mg/dL  CG4 I-STAT (LACTIC ACID)      Result Value Range   Lactic Acid, Venous 2.27 (*) 0.5 - 2.2 mmol/L     EKG/XRAY:   Primary read interpreted by Dr. Marin Comment at [facility].   ASSESSMENT/PLAN: Encounter Diagnoses  Name Primary?  . Cough Yes  . COPD exacerbation   . Hospital discharge follow-up   . Wheezing     Perley Schiano is a thin elderly  80 y.o.[place] widowed male with COPD emphysema, HTN, Hyperlipidemia, Diabetes last HbA1c was around 8.0,   who is here for follow up from a recent hospital visit. He was admitted for COPD exacerbation  With possible CAP  from 1/4-06/20/2013. He was discharged with Levaquin x 5 more days.   Continue with nebs  q6 hrs and prn until we see him back in 4 days Continue with Levaquin Cont with all  meds dc from hospital and his previous home meds He was previously on metformin and was on SSI during hospitalization, he was not discharged on insulin due to the potential for hypoglycemia in an already very thin and slightly cachetic single, widowed, elderly man who lives alone.  His HbA1c in the past has ranged from  7 to 7.7. I am not going to cause more problems by giving him insulin at this time. We will continue to monitor.  Rx Tessalon perles and also hycodan with precautions for cough which is what he actually came in for F/u in 4 days on Sunday  06/24/13 for recheck of SOB, wheezing. Labs at time of hospital discharge were satisfactory except for glucose.    Gross sideeffects, risk and benefits, and alternatives of medications d/w patient. Patient is aware that all medications have potential sideeffects and we are unable to predict every sideeffect or drug-drug interaction that may occur.  LE, [place], DO 06/21/2013 5:40 PM

## 2013-06-26 ENCOUNTER — Ambulatory Visit (INDEPENDENT_AMBULATORY_CARE_PROVIDER_SITE_OTHER): Payer: 59 | Admitting: Family Medicine

## 2013-06-26 VITALS — BP 122/74 | HR 110 | Temp 98.0°F | Resp 17 | Ht 63.5 in | Wt 101.0 lb

## 2013-06-26 DIAGNOSIS — J441 Chronic obstructive pulmonary disease with (acute) exacerbation: Secondary | ICD-10-CM

## 2013-06-26 MED ORDER — PREDNISONE 20 MG PO TABS: 40.0000 mg | ORAL_TABLET | Freq: Every day | ORAL | Status: DC

## 2013-06-26 MED ORDER — METHYLPREDNISOLONE ACETATE 80 MG/ML IJ SUSP
80.0000 mg | Freq: Once | INTRAMUSCULAR | Status: AC
  Administered 2013-06-26: 80 mg via INTRAMUSCULAR

## 2013-06-26 NOTE — Progress Notes (Signed)
Patient ID: Nathaniel [name] MRN: [id number], DOB: 1934-04-05, 80 y.o. Date of Encounter: 06/26/2013, 3:42 PM  Primary Physician: [name], DO  Chief Complaint:  Chief Complaint  Patient presents with  . Follow-up    HPI: 80 y.o. year old male presents with a 10 day history of nasal congestion, post nasal drip, sore throat, and cough. Mild sinus pressure. Afebrile. No chills. Nasal congestion thick and green/yellow. Cough is productive of green/yellow sputum and not associated with time of day. Ears feel full, leading to sensation of muffled hearing. Has tried OTC cold preps without success. No GI complaints. Appetite poor  No sick contacts, recent antibiotics, or recent travels.   No leg trauma, sedentary periods, h/o cancer, or tobacco use.  Past Medical History  Diagnosis Date  . Hyperlipidemia   . Hypertension   . Diabetic neuropathy     /H&P 01/30/2013 (01/30/2013)  . Diabetes mellitus   . BPH (benign prostatic hyperplasia)     /H&P 01/30/2013 (01/30/2013)  . Emphysematous cystitis     /H&P 01/30/2013 (01/30/2013)  . COPD (chronic obstructive pulmonary disease)   . Orthopnoea      Home Meds: Prior to Admission medications   Medication Sig Start Date End Date Taking? Authorizing Provider  albuterol (PROVENTIL) (2.5 MG/3ML) 0.083% nebulizer solution Take 3 mLs (2.5 mg total) by nebulization every 4 (four) hours as needed for wheezing or shortness of breath. 06/20/13  Yes Robbie Lis, MD  albuterol (VENTOLIN HFA) 108 (90 BASE) MCG/ACT inhaler Inhale 2 puffs into the lungs every 4 (four) hours while awake. 02/09/13  Yes Ellison Carwin, MD  benzonatate (TESSALON) 100 MG capsule Take 2 capsules (200 mg total) by mouth 2 (two) times daily as needed for cough. 06/21/13  Yes Thao P Le, DO  finasteride (PROSCAR) 5 MG tablet Take 1 tablet (5 mg total) by mouth daily. 06/20/13  Yes Robbie Lis, MD  Fluticasone-Salmeterol (ADVAIR) 500-50 MCG/DOSE AEPB Inhale 1 puff into the lungs 2 (two)  times daily. 02/09/13  Yes Ellison Carwin, MD  gabapentin (NEURONTIN) 100 MG capsule Take 200 mg by mouth 3 (three) times daily. 11/06/12 11/06/13 Yes Waldemar Dickens, MD  HYDROcodone-acetaminophen (NORCO/VICODIN) 5-325 MG per tablet Take 1-2 tablets by mouth every 4 (four) hours as needed for moderate pain. 06/20/13  Yes Robbie Lis, MD  HYDROcodone-homatropine [facility]) 5-1.5 MG/5ML syrup Take 5 mLs by mouth at bedtime as needed for cough. 06/21/13  Yes Thao P Le, DO  levofloxacin (LEVAQUIN) 750 MG tablet Take 1 tablet (750 mg total) by mouth daily. 06/20/13  Yes Robbie Lis, MD  metFORMIN (GLUCOPHAGE) 500 MG tablet Take 1 tablet (500 mg total) by mouth 2 (two) times daily with a meal. 1 tablet by mouth twice daily for diabetes 09/16/12  Yes Posey Boyer, MD  montelukast (SINGULAIR) 10 MG tablet Take 1 tablet (10 mg total) by mouth at bedtime. 06/20/13  Yes Robbie Lis, MD  pantoprazole (PROTONIX) 40 MG tablet Take 1 tablet (40 mg total) by mouth daily. 06/20/13  Yes Robbie Lis, MD  polyethylene glycol [facility] / Floria Raveling) packet Take 17 g by mouth daily as needed for mild constipation. 06/20/13  Yes Robbie Lis, MD  polyethylene glycol powder (GLYCOLAX/MIRALAX) powder Take 17 g by mouth 2 (two) times daily as needed. 06/21/13  Yes Thao P Le, DO  simvastatin (ZOCOR) 20 MG tablet Take 1 tablet (20 mg total) by mouth daily at 6 PM. 06/20/13  Yes  Robbie Lis, MD  tamsulosin (FLOMAX) 0.4 MG CAPS capsule Take 2 capsules (0.8 mg total) by mouth daily after supper. 06/20/13  Yes Robbie Lis, MD  tiotropium (SPIRIVA) 18 MCG inhalation capsule Place 1 capsule (18 mcg total) into inhaler and inhale daily. 06/20/13  Yes Robbie Lis, MD  predniSONE (DELTASONE) 20 MG tablet Take 2 tablets (40 mg total) by mouth daily. 06/26/13   Robyn Haber, MD    Allergies: No Known Allergies  History   Social History  . Marital Status: Widowed    Spouse Name: N/A    Number of Children: N/A  . Years of Education: N/A    Occupational History  . Not on file.   Social History Main Topics  . Smoking status: Former Smoker -- 0.25 packs/day for 42 years    Types: Cigarettes  . Smokeless tobacco: Never Used     Comment: 01/30/2013 "quit smoking ~ 10 yr ago"  . Alcohol Use: No  . Drug Use: No  . Sexual Activity: Not Currently   Other Topics Concern  . Not on file   Social History Narrative  . No narrative on file     Review of Systems: Constitutional: negative for chills, fever, night sweats or weight changes Cardiovascular: negative for chest pain or palpitations Respiratory: negative for hemoptysis, wheezing, or shortness of breath Abdominal: negative for abdominal pain, nausea, vomiting or diarrhea Dermatological: negative for rash Neurologic: negative for headache   Physical Exam:  NAD, thin asian male Blood pressure 122/74, pulse 110, temperature 98 F (36.7 C), temperature source Oral, resp. rate 17, height 5' 3.5" (1.613 m), weight 101 lb (45.813 kg), SpO2 93.00%., Body mass index is 17.61 kg/(m^2). General: Well developed, well nourished, in no acute distress. Head: Normocephalic, atraumatic, eyes without discharge, sclera non-icteric, nares are congested. Bilateral auditory canals clear, TM's are without perforation, pearly grey with reflective cone of light bilaterally. No sinus TTP. Oral cavity moist, dentition normal. Posterior pharynx with post nasal drip and mild erythema. No peritonsillar abscess or tonsillar exudate. Neck: Supple. No thyromegaly. Full ROM. No lymphadenopathy. Lungs: Coarse breath sounds bilaterally with inspiratory and expiratory wheezes, but no rales, or rhonchi. Breathing is unlabored.  Heart: RRR with S1 S2. No murmurs, rubs, or gallops appreciated. Msk:  Strength and tone normal for age. Extremities: No clubbing or cyanosis. No edema. Neuro: Alert and oriented X 3. Moves all extremities spontaneously. CNII-XII grossly in tact. Psych:  Responds to questions  appropriately with a normal affect.    ASSESSMENT AND PLAN:  80 y.o. year old male with bronchitis. COPD exacerbation - Plan: methylPREDNISolone acetate (DEPO-MEDROL) injection 80 mg, predniSONE (DELTASONE) 20 MG tablet follow up 3 days -Mucinex -Tylenol/Motrin prn -Rest/fluids -RTC precautions -RTC 3-5 days if no improvement  Signed, Robyn Haber, MD 06/26/2013 3:42 PM

## 2013-06-30 ENCOUNTER — Ambulatory Visit (INDEPENDENT_AMBULATORY_CARE_PROVIDER_SITE_OTHER): Payer: 59 | Admitting: Family Medicine

## 2013-06-30 VITALS — BP 138/80 | HR 84 | Temp 97.6°F | Resp 16 | Wt 103.2 lb

## 2013-06-30 DIAGNOSIS — K219 Gastro-esophageal reflux disease without esophagitis: Secondary | ICD-10-CM

## 2013-06-30 DIAGNOSIS — R05 Cough: Secondary | ICD-10-CM

## 2013-06-30 DIAGNOSIS — R059 Cough, unspecified: Secondary | ICD-10-CM

## 2013-06-30 MED ORDER — HYDROCOD POLST-CHLORPHEN POLST 10-8 MG/5ML PO LQCR: 5.0000 mL | Freq: Two times a day (BID) | ORAL | Status: DC | PRN

## 2013-06-30 MED ORDER — OMEPRAZOLE 20 MG PO CPDR: 20.0000 mg | DELAYED_RELEASE_CAPSULE | Freq: Every day | ORAL | Status: DC

## 2013-06-30 NOTE — Patient Instructions (Signed)
Gastroesophageal Reflux Disease, Adult Gastroesophageal reflux disease (GERD) happens when acid from your stomach flows up into the esophagus. When acid comes in contact with the esophagus, the acid causes soreness (inflammation) in the esophagus. Over time, GERD may create small holes (ulcers) in the lining of the esophagus. CAUSES   Increased body weight. This puts pressure on the stomach, making acid rise from the stomach into the esophagus.  Smoking. This increases acid production in the stomach.  Drinking alcohol. This causes decreased pressure in the lower esophageal sphincter (valve or ring of muscle between the esophagus and stomach), allowing acid from the stomach into the esophagus.  Late evening meals and a full stomach. This increases pressure and acid production in the stomach.  A malformed lower esophageal sphincter. Sometimes, no cause is found. SYMPTOMS   Burning pain in the lower part of the mid-chest behind the breastbone and in the mid-stomach area. This may occur twice a week or more often.  Trouble swallowing.  Sore throat.  Dry cough.  Asthma-like symptoms including chest tightness, shortness of breath, or wheezing. DIAGNOSIS  Your caregiver may be able to diagnose GERD based on your symptoms. In some cases, X-rays and other tests may be done to check for complications or to check the condition of your stomach and esophagus. TREATMENT  Your caregiver may recommend over-the-counter or prescription medicines to help decrease acid production. Ask your caregiver before starting or adding any new medicines.  HOME CARE INSTRUCTIONS   Change the factors that you can control. Ask your caregiver for guidance concerning weight loss, quitting smoking, and alcohol consumption.  Avoid foods and drinks that make your symptoms worse, such as:  Caffeine or alcoholic drinks.  Chocolate.  Peppermint or mint flavorings.  Garlic and onions.  Spicy foods.  Citrus fruits,  such as oranges, lemons, or limes.  Tomato-based foods such as sauce, chili, salsa, and pizza.  Fried and fatty foods.  Avoid lying down for the 3 hours prior to your bedtime or prior to taking a nap.  Eat small, frequent meals instead of large meals.  Wear loose-fitting clothing. Do not wear anything tight around your waist that causes pressure on your stomach.  Raise the head of your bed 6 to 8 inches with wood blocks to help you sleep. Extra pillows will not help.  Only take over-the-counter or prescription medicines for pain, discomfort, or fever as directed by your caregiver.  Do not take aspirin, ibuprofen, or other nonsteroidal anti-inflammatory drugs (NSAIDs). SEEK IMMEDIATE MEDICAL CARE IF:   You have pain in your arms, neck, jaw, teeth, or back.  Your pain increases or changes in intensity or duration.  You develop nausea, vomiting, or sweating (diaphoresis).  You develop shortness of breath, or you faint.  Your vomit is green, yellow, black, or looks like coffee grounds or blood.  Your stool is red, bloody, or black. These symptoms could be signs of other problems, such as heart disease, gastric bleeding, or esophageal bleeding. MAKE SURE YOU:   Understand these instructions.  Will watch your condition.  Will get help right away if you are not doing well or get worse. Document Released: 03/10/2005 Document Revised: 08/23/2011 Document Reviewed: 12/18/2010 [facility] Patient Information 2014 [place], [place]. Vim ph? qu?n (Bronchitis) Vim ph? qu?n l vim ???ng th? t? kh qu?n vo ph?i (ph? qu?n). Tnh tr?ng vim th??ng gy s?n sinh d?ch nh?y v d?n ??n ho. N?u vim tr? nn n?ng h?n, n c th? gy ra kh th?. NGUYN  NHN.  Vim ph? qu?n c th? do:   Nhi?m vi rt  Vi khu?n.  Khi thu?c l.  D? ?ng nguyn, ch?t gy  nhi?m v cc ch?t gy kch ?ng khc. D?U HI?U V TRI?U CH?NG  Tri?u ch?ng ph? bi?n nh?t c?a vim ph? qu?n l ho th??ng xuyn ra d?ch nh?y.  Cc tri?u ch?ng khc bao g?m:  S?t.  ?au nh?c ton thn.  T?c ng?c.  ?n l?nh.  Kh th?.  ?au h?ng. CH?N ?ON  Vim ph? qu?n th??ng ???c ch?n ?on thng qua b?nh s? v khm th?c th?. Nh?ng ki?m tra nh? ch?p X-quang ng?c ??i khi ???c th?c hi?n ?? lo?i tr? cc b?nh l khc.  ?I?U TR?  Qu v? c th? c?n trnh ti?p xc v?i t?t c? nh?ng g gy b?nh (v d? nh? khi thu?c). ?i khi c?n ph?i dng thu?c. Nh?ng thu?c ny c th? bao g?m:  Khng sinh. Thu?c khng sinh c th? ???c k ??n n?u tnh tr?ng b?nh do vi khu?n gy ra.  Thu?c gi?m ho. Nh?ng thu?c ny c th? ???c k ??n ?? gi?m tri?u ch?ng ho.  Thu?c d?ng ht. Nh?ng thu?c ny c th? ???c k ??n ?? gip khai thng ???ng th? v lm cho qu v? d? th? h?n.  Cc lo?i thu?c steroid. Nh?ng lo?i thu?c ny c th? ???c k ??n cho nh?ng ng??i b? vim ph? qu?n ti pht (m?n tnh) H??NG D?N CH?M [place] T?I NH  Ngh? ng?i th?t nhi?u.  U?ng ?? n??c ?? n??c ti?u lun trong ho?c mu vng nh?t (tr? khi qu v? c b?nh l c?n ph?i h?n ch? u?ng n??c). T?ng l??ng n??c c th? gip lm l?ng ??m v s? ng?n ng?a m?t n??c.  Ch? s? d?ng thu?c khng c?n k ??n ho?c thu?c c?n k ??n theo ch? d?n c?a chuyn gia ch?m [place] s?c kh?e.  Ch? dng thu?c khng sinh theo ch? d?n. B?o ??m vi?c qu v? dng h?t thu?c ngay c? khi qu v? b?t ??u c?m th?y ?? h?n.  Hessie Diener khi thu?c th? ??ng, cc ch?t gy kch ?ng, v lu?ng khi m?nh. Nh?ng th? ny lm vim ph? qu?n n?ng h?n. N?u qu v? ht thu?c, hy b? ht thu?c. Cn nh?c s? d?ng k?o cao su c nicotine ho?c mi?ng dn trn da ?? gip ki?m sot cc tri?u ch?ng sau cai. B? ht thu?c s? gip ph?i bnh ph?c nhanh h?n.  ?? m?t chi?c my t?o s??ng mt trong phng ng? vo ban ?m ?? lm ?m khng kh. Vi?c ny gip lm l?ng d?ch nh?y. Thay n??c trong my t?o s??ng hng ngy. Qu v? c?ng c th? cho n??c nng khi t?m vi hoa sen v ng?i trong phng t?m ?ng kn c?a trong 5-10 pht.  G?p chuyn gia ch?m [place] s?c kh?e ?? khm l?i theo ch?  d?n.  R?a tay th??ng xuyn ?? trnh m?c l?i b?nh vim ph? qu?n ho?c ly b?nh cho ng??i khc. ?I [facility] N?U: Cc tri?u ch?ng c?a qu v? khng gi?m b?t sau 1 tu?n ?i?u tr?Marland Kitchen  NGAY L?P T?C ?I K[facility] N?U:  C?n s?t c?a qu v? t?ng ln.  Qu v? b? ?n l?nh.  Qu v? b? ?au ng?c.  Qu v? b? kh th? h?n.  Qu v? c ??m l?n mu.  Qu v? b? ng?t.  Qu v? b? chong vng.  Qu v? b? ?au ??u n?ng.  Qu v? nn nhi?u l?n. ??M B?O QU V?:   Hi?u r cc h??ng d?n  ny.  S? theo di tnh tr?ng c?a mnh.  S? yu c?u tr? gip ngay l?p t?c n?u qu v? c?m th?y khng kh?e ho?c th?y tr?m tr?ng h?n. Document Released: 05/31/2005 Document Revised: 03/21/2013 [facility] Patient Information 2014 [place], [place].

## 2013-06-30 NOTE — Progress Notes (Signed)
80 year old [place] man who speaks very poor [place].  He says that he is belching frequently and still has a cough.  Objective: HEENT: Unremarkable No acute distress or respiratory distress This is been man who is very good natured. Chest: Few rhonchi but in general very diminished breath sounds bilaterally Heart: Regular no murmur Skin: No suspicious lesions or rashes  Cough - Plan: chlorpheniramine-HYDROcodone (TUSSIONEX PENNKINETIC ER) 10-8 MG/5ML LQCR  GERD (gastroesophageal reflux disease) - Plan: chlorpheniramine-HYDROcodone (TUSSIONEX PENNKINETIC ER) 10-8 MG/5ML LQCR, omeprazole (PRILOSEC) 20 MG capsule  Signed, Robyn Haber, MD

## 2013-07-07 ENCOUNTER — Emergency Department (HOSPITAL_COMMUNITY): Payer: PRIVATE HEALTH INSURANCE

## 2013-07-07 ENCOUNTER — Inpatient Hospital Stay (HOSPITAL_COMMUNITY)
Admission: EM | Admit: 2013-07-07 | Discharge: 2013-07-09 | DRG: 191 | Disposition: A | Discharge status: Home Health | Payer: PRIVATE HEALTH INSURANCE | Attending: Family Medicine | Admitting: Family Medicine

## 2013-07-07 ENCOUNTER — Encounter (HOSPITAL_COMMUNITY): Payer: Self-pay | Admitting: Emergency Medicine

## 2013-07-07 ENCOUNTER — Inpatient Hospital Stay (HOSPITAL_COMMUNITY): Payer: PRIVATE HEALTH INSURANCE

## 2013-07-07 DIAGNOSIS — N138 Other obstructive and reflux uropathy: Secondary | ICD-10-CM | POA: Diagnosis present

## 2013-07-07 DIAGNOSIS — I10 Essential (primary) hypertension: Secondary | ICD-10-CM

## 2013-07-07 DIAGNOSIS — E0590 Thyrotoxicosis, unspecified without thyrotoxic crisis or storm: Secondary | ICD-10-CM | POA: Diagnosis present

## 2013-07-07 DIAGNOSIS — E872 Acidosis, unspecified: Secondary | ICD-10-CM | POA: Diagnosis present

## 2013-07-07 DIAGNOSIS — K219 Gastro-esophageal reflux disease without esophagitis: Secondary | ICD-10-CM

## 2013-07-07 DIAGNOSIS — E119 Type 2 diabetes mellitus without complications: Secondary | ICD-10-CM

## 2013-07-07 DIAGNOSIS — E1142 Type 2 diabetes mellitus with diabetic polyneuropathy: Secondary | ICD-10-CM | POA: Diagnosis present

## 2013-07-07 DIAGNOSIS — E785 Hyperlipidemia, unspecified: Secondary | ICD-10-CM | POA: Diagnosis present

## 2013-07-07 DIAGNOSIS — E1149 Type 2 diabetes mellitus with other diabetic neurological complication: Secondary | ICD-10-CM | POA: Diagnosis present

## 2013-07-07 DIAGNOSIS — N401 Enlarged prostate with lower urinary tract symptoms: Secondary | ICD-10-CM | POA: Diagnosis present

## 2013-07-07 DIAGNOSIS — Z87891 Personal history of nicotine dependence: Secondary | ICD-10-CM

## 2013-07-07 DIAGNOSIS — Z8546 Personal history of malignant neoplasm of prostate: Secondary | ICD-10-CM

## 2013-07-07 DIAGNOSIS — J441 Chronic obstructive pulmonary disease with (acute) exacerbation: Principal | ICD-10-CM

## 2013-07-07 DIAGNOSIS — Z79899 Other long term (current) drug therapy: Secondary | ICD-10-CM

## 2013-07-07 DIAGNOSIS — R6510 Systemic inflammatory response syndrome (SIRS) of non-infectious origin without acute organ dysfunction: Secondary | ICD-10-CM | POA: Diagnosis present

## 2013-07-07 DIAGNOSIS — R339 Retention of urine, unspecified: Secondary | ICD-10-CM | POA: Diagnosis present

## 2013-07-07 LAB — CBC WITH DIFFERENTIAL/PLATELET
BASOS ABS: 0 10*3/uL (ref 0.0–0.1)
BASOS PCT: 0 % (ref 0–1)
Eosinophils Absolute: 0.3 10*3/uL (ref 0.0–0.7)
Eosinophils Relative: 3 % (ref 0–5)
HEMATOCRIT: 41.6 % (ref 39.0–52.0)
Hemoglobin: 14.4 g/dL (ref 13.0–17.0)
LYMPHS PCT: 14 % (ref 12–46)
Lymphs Abs: 1.3 10*3/uL (ref 0.7–4.0)
MCH: 29.7 pg (ref 26.0–34.0)
MCHC: 34.6 g/dL (ref 30.0–36.0)
MCV: 85.8 fL (ref 78.0–100.0)
Monocytes Absolute: 0.4 10*3/uL (ref 0.1–1.0)
Monocytes Relative: 4 % (ref 3–12)
NEUTROS ABS: 7.3 10*3/uL (ref 1.7–7.7)
NEUTROS PCT: 79 % — AB (ref 43–77)
Platelets: 202 10*3/uL (ref 150–400)
RBC: 4.85 MIL/uL (ref 4.22–5.81)
RDW: 12.8 % (ref 11.5–15.5)
WBC: 9.2 10*3/uL (ref 4.0–10.5)

## 2013-07-07 LAB — TROPONIN I: Troponin I: <0.3 ng/mL (ref <0.30)

## 2013-07-07 LAB — GLUCOSE, CAPILLARY
GLUCOSE-CAPILLARY: 320 mg/dL — AB (ref 70–99)
Glucose-Capillary: 299 mg/dL — ABNORMAL HIGH (ref 70–99)
Glucose-Capillary: 275 mg/dL — ABNORMAL HIGH (ref 70–99)

## 2013-07-07 LAB — COMPREHENSIVE METABOLIC PANEL
ALBUMIN: 3.8 g/dL (ref 3.5–5.2)
ALK PHOS: 73 U/L (ref 39–117)
ALT: 12 U/L (ref 0–53)
AST: 10 U/L (ref 0–37)
BILIRUBIN TOTAL: 0.4 mg/dL (ref 0.3–1.2)
BUN: 37 mg/dL — ABNORMAL HIGH (ref 6–23)
CHLORIDE: 96 meq/L (ref 96–112)
CO2: 27 meq/L (ref 19–32)
Calcium: 8.8 mg/dL (ref 8.4–10.5)
Creatinine, Ser: 1.1 mg/dL (ref 0.50–1.35)
GFR calc Af Amer: 73 mL/min — ABNORMAL LOW (ref >90)
GFR, EST NON AFRICAN AMERICAN: 63 mL/min — AB (ref >90)
Glucose, Bld: 408 mg/dL — ABNORMAL HIGH (ref 70–99)
POTASSIUM: 3.9 meq/L (ref 3.7–5.3)
SODIUM: 139 meq/L (ref 137–147)
Total Protein: 7.3 g/dL (ref 6.0–8.3)

## 2013-07-07 LAB — POCT I-STAT 3, ART BLOOD GAS (G3+)
ACID-BASE EXCESS: 2 mmol/L (ref 0.0–2.0)
BICARBONATE: 27.2 meq/L — AB (ref 20.0–24.0)
O2 SAT: 90 %
PO2 ART: 60 mmHg — AB (ref 80.0–100.0)
Patient temperature: 98.7
TCO2: 29 mmol/L (ref 0–100)
pCO2 arterial: 44.2 mmHg (ref 35.0–45.0)
pH, Arterial: 7.398 (ref 7.350–7.450)

## 2013-07-07 LAB — PRO B NATRIURETIC PEPTIDE: Pro B Natriuretic peptide (BNP): 36.9 pg/mL (ref 0–450)

## 2013-07-07 LAB — D-DIMER, QUANTITATIVE: D-Dimer, Quant: 0.65 ug/mL-FEU — ABNORMAL HIGH (ref 0.00–0.48)

## 2013-07-07 LAB — CG4 I-STAT (LACTIC ACID): LACTIC ACID, VENOUS: 6.08 mmol/L — AB (ref 0.5–2.2)

## 2013-07-07 MED ORDER — TIOTROPIUM BROMIDE MONOHYDRATE 18 MCG IN CAPS
18.0000 ug | ORAL_CAPSULE | Freq: Every day | RESPIRATORY_TRACT | Status: DC
  Filled 2013-07-07: qty 5

## 2013-07-07 MED ORDER — HEPARIN SODIUM (PORCINE) 5000 UNIT/ML IJ SOLN
5000.0000 [IU] | Freq: Three times a day (TID) | INTRAMUSCULAR | Status: DC
  Administered 2013-07-07 – 2013-07-09 (×6): 5000 [IU] via SUBCUTANEOUS
  Filled 2013-07-07 (×8): qty 1

## 2013-07-07 MED ORDER — IPRATROPIUM BROMIDE 0.02 % IN SOLN
RESPIRATORY_TRACT | Status: AC
  Filled 2013-07-07: qty 2.5

## 2013-07-07 MED ORDER — MOMETASONE FURO-FORMOTEROL FUM 200-5 MCG/ACT IN AERO
2.0000 | INHALATION_SPRAY | Freq: Two times a day (BID) | RESPIRATORY_TRACT | Status: DC
  Administered 2013-07-07 – 2013-07-09 (×4): 2 via RESPIRATORY_TRACT
  Filled 2013-07-07: qty 8.8

## 2013-07-07 MED ORDER — IOHEXOL 300 MG/ML  SOLN: 25.0000 mL | INTRAMUSCULAR | Status: DC

## 2013-07-07 MED ORDER — IOHEXOL 350 MG/ML SOLN
80.0000 mL | Freq: Once | INTRAVENOUS | Status: AC | PRN
  Administered 2013-07-07: 80 mL via INTRAVENOUS

## 2013-07-07 MED ORDER — DOXYCYCLINE HYCLATE 100 MG PO TABS
100.0000 mg | ORAL_TABLET | Freq: Two times a day (BID) | ORAL | Status: DC
  Administered 2013-07-07 – 2013-07-09 (×4): 100 mg via ORAL
  Filled 2013-07-07 (×5): qty 1

## 2013-07-07 MED ORDER — INSULIN ASPART 100 UNIT/ML ~~LOC~~ SOLN
8.0000 [IU] | Freq: Once | SUBCUTANEOUS | Status: AC
  Administered 2013-07-07: 8 [IU] via SUBCUTANEOUS
  Filled 2013-07-07: qty 1

## 2013-07-07 MED ORDER — SODIUM CHLORIDE 0.9 % IV SOLN
INTRAVENOUS | Status: DC
  Administered 2013-07-07 – 2013-07-08 (×3): via INTRAVENOUS
  Administered 2013-07-09: 125 mL/h via INTRAVENOUS

## 2013-07-07 MED ORDER — HYDROCOD POLST-CHLORPHEN POLST 10-8 MG/5ML PO LQCR
5.0000 mL | Freq: Two times a day (BID) | ORAL | Status: DC | PRN
  Administered 2013-07-09: 5 mL via ORAL
  Filled 2013-07-07: qty 5

## 2013-07-07 MED ORDER — SODIUM CHLORIDE 0.9 % IV SOLN
Freq: Once | INTRAVENOUS | Status: AC
  Administered 2013-07-07: 18:00:00 via INTRAVENOUS

## 2013-07-07 MED ORDER — IPRATROPIUM BROMIDE 0.02 % IN SOLN
0.5000 mg | Freq: Once | RESPIRATORY_TRACT | Status: AC
  Administered 2013-07-07: 0.5 mg via RESPIRATORY_TRACT
  Filled 2013-07-07: qty 2.5

## 2013-07-07 MED ORDER — SODIUM CHLORIDE 0.9 % IV SOLN
Freq: Once | INTRAVENOUS | Status: DC
Start: 2013-07-07 — End: 2013-07-07

## 2013-07-07 MED ORDER — GABAPENTIN 100 MG PO CAPS
200.0000 mg | ORAL_CAPSULE | Freq: Three times a day (TID) | ORAL | Status: DC
  Administered 2013-07-07 – 2013-07-09 (×6): 200 mg via ORAL
  Filled 2013-07-07 (×7): qty 2

## 2013-07-07 MED ORDER — ALBUTEROL (5 MG/ML) CONTINUOUS INHALATION SOLN
10.0000 mg/h | INHALATION_SOLUTION | Freq: Once | RESPIRATORY_TRACT | Status: AC
  Administered 2013-07-07: 10 mg/h via RESPIRATORY_TRACT
  Filled 2013-07-07: qty 20

## 2013-07-07 MED ORDER — TAMSULOSIN HCL 0.4 MG PO CAPS
0.8000 mg | ORAL_CAPSULE | Freq: Every day | ORAL | Status: DC
  Administered 2013-07-07 – 2013-07-09 (×3): 0.8 mg via ORAL
  Filled 2013-07-07 (×3): qty 2

## 2013-07-07 MED ORDER — SODIUM CHLORIDE 0.9 % IV BOLUS (SEPSIS)
1000.0000 mL | Freq: Once | INTRAVENOUS | Status: AC
  Administered 2013-07-07: 1000 mL via INTRAVENOUS

## 2013-07-07 MED ORDER — MONTELUKAST SODIUM 10 MG PO TABS
10.0000 mg | ORAL_TABLET | Freq: Every day | ORAL | Status: DC
  Administered 2013-07-07 – 2013-07-08 (×2): 10 mg via ORAL
  Filled 2013-07-07 (×3): qty 1

## 2013-07-07 MED ORDER — ALBUTEROL SULFATE (2.5 MG/3ML) 0.083% IN NEBU: 2.5000 mg | INHALATION_SOLUTION | RESPIRATORY_TRACT | Status: DC | PRN

## 2013-07-07 MED ORDER — SODIUM CHLORIDE 0.9 % IJ SOLN
3.0000 mL | Freq: Two times a day (BID) | INTRAMUSCULAR | Status: DC
  Administered 2013-07-07 – 2013-07-09 (×4): 3 mL via INTRAVENOUS

## 2013-07-07 MED ORDER — SIMVASTATIN 20 MG PO TABS
20.0000 mg | ORAL_TABLET | Freq: Every day | ORAL | Status: DC
  Administered 2013-07-07 – 2013-07-09 (×3): 20 mg via ORAL
  Filled 2013-07-07 (×3): qty 1

## 2013-07-07 MED ORDER — PREDNISONE 50 MG PO TABS
50.0000 mg | ORAL_TABLET | Freq: Every day | ORAL | Status: DC
  Administered 2013-07-07 – 2013-07-09 (×2): 50 mg via ORAL
  Filled 2013-07-07 (×3): qty 1

## 2013-07-07 MED ORDER — INSULIN ASPART 100 UNIT/ML ~~LOC~~ SOLN
0.0000 [IU] | Freq: Three times a day (TID) | SUBCUTANEOUS | Status: DC
  Administered 2013-07-07: 7 [IU] via SUBCUTANEOUS
  Administered 2013-07-08 (×2): 3 [IU] via SUBCUTANEOUS
  Administered 2013-07-08: 7 [IU] via SUBCUTANEOUS
  Administered 2013-07-09: 5 [IU] via SUBCUTANEOUS
  Administered 2013-07-09 (×2): 2 [IU] via SUBCUTANEOUS

## 2013-07-07 MED ORDER — PANTOPRAZOLE SODIUM 40 MG PO TBEC
40.0000 mg | DELAYED_RELEASE_TABLET | Freq: Every day | ORAL | Status: DC
  Administered 2013-07-07 – 2013-07-09 (×3): 40 mg via ORAL
  Filled 2013-07-07 (×3): qty 1

## 2013-07-07 MED ORDER — BENZONATATE 100 MG PO CAPS
200.0000 mg | ORAL_CAPSULE | Freq: Two times a day (BID) | ORAL | Status: DC | PRN
  Administered 2013-07-08 – 2013-07-09 (×2): 200 mg via ORAL
  Filled 2013-07-07 (×2): qty 2

## 2013-07-07 MED ORDER — IPRATROPIUM-ALBUTEROL 0.5-2.5 (3) MG/3ML IN SOLN
3.0000 mL | RESPIRATORY_TRACT | Status: DC
  Administered 2013-07-07 – 2013-07-08 (×4): 3 mL via RESPIRATORY_TRACT
  Filled 2013-07-07 (×4): qty 3

## 2013-07-07 MED ORDER — FINASTERIDE 5 MG PO TABS
5.0000 mg | ORAL_TABLET | Freq: Every day | ORAL | Status: DC
  Administered 2013-07-07 – 2013-07-09 (×3): 5 mg via ORAL
  Filled 2013-07-07 (×3): qty 1

## 2013-07-07 MED ORDER — SODIUM CHLORIDE 0.9 % IV SOLN
INTRAVENOUS | Status: DC
  Administered 2013-07-07: 15:00:00 via INTRAVENOUS

## 2013-07-07 MED ORDER — LEVOFLOXACIN IN D5W 500 MG/100ML IV SOLN
500.0000 mg | Freq: Once | INTRAVENOUS | Status: AC
  Administered 2013-07-07: 500 mg via INTRAVENOUS
  Filled 2013-07-07: qty 100

## 2013-07-07 NOTE — ED Provider Notes (Signed)
CSN: [id number]     Arrival date & time 07/07/13  1035 History   First MD Initiated Contact with Patient 07/07/13 1036     Chief Complaint  Patient presents with  . Chest Pain  . Shortness of Breath   (Consider location/radiation/quality/duration/timing/severity/associated sxs/prior Treatment) HPI Comments: Patient presents with chest pain, shortness of breath and wheezing that started this morning. His history of COPD with multiple medicines for the same. He says he woke up feeling short of breath and some inhaler and felt better after that. He is given nebulizers and steroids via EMS. He developed some right-sided chest pain and comes in with coughing and has no chest pain it is not coughing. Denies any leg pain or leg swelling. Denies abdominal pain. Denies any fevers at home. The cough is productive of white mucus.  The history is provided by the patient and the EMS personnel. The history is limited by a language barrier and the condition of the patient. A language interpreter was used.    Past Medical History  Diagnosis Date  . Hyperlipidemia   . Hypertension   . Diabetic neuropathy     /H&P 01/30/2013 (01/30/2013)  . Diabetes mellitus   . BPH (benign prostatic hyperplasia)     /H&P 01/30/2013 (01/30/2013)  . Emphysematous cystitis     /H&P 01/30/2013 (01/30/2013)  . COPD (chronic obstructive pulmonary disease)   . Orthopnoea    Past Surgical History  Procedure Laterality Date  . No past surgeries     History reviewed. No pertinent family history. History  Substance Use Topics  . Smoking status: Former Smoker -- 0.25 packs/day for 42 years    Types: Cigarettes  . Smokeless tobacco: Never Used     Comment: 01/30/2013 "quit smoking ~ 10 yr ago"  . Alcohol Use: No    Review of Systems  Constitutional: Positive for activity change and appetite change. Negative for fever.  HENT: Positive for congestion and rhinorrhea.   Respiratory: Positive for cough, chest tightness and  shortness of breath.   Cardiovascular: Negative for chest pain.  Gastrointestinal: Negative for nausea, vomiting and abdominal pain.  Genitourinary: Negative for dysuria and hematuria.  Musculoskeletal: Negative for back pain and myalgias.  Skin: Negative for rash.  Neurological: Negative for dizziness and headaches.  A complete 10 system review of systems was obtained and all systems are negative except as noted in the HPI and PMH.    Allergies  Review of patient's allergies indicates no known allergies.  Home Medications   Current Outpatient Rx  Name  Route  Sig  Dispense  Refill  . albuterol (PROVENTIL) (2.5 MG/3ML) 0.083% nebulizer solution   Nebulization   Take 3 mLs (2.5 mg total) by nebulization every 4 (four) hours as needed for wheezing or shortness of breath.   75 mL   12   . albuterol (VENTOLIN HFA) 108 (90 BASE) MCG/ACT inhaler   Inhalation   Inhale 2 puffs into the lungs every 4 (four) hours while awake.   1 Inhaler   12   . benzonatate (TESSALON) 100 MG capsule   Oral   Take 2 capsules (200 mg total) by mouth 2 (two) times daily as needed for cough.   30 capsule   1   . chlorpheniramine-HYDROcodone (TUSSIONEX PENNKINETIC ER) 10-8 MG/5ML LQCR   Oral   Take 5 mLs by mouth every 12 (twelve) hours as needed for cough.   120 mL   0   . finasteride (PROSCAR) 5 MG  tablet   Oral   Take 1 tablet (5 mg total) by mouth daily.   30 tablet   0   . Fluticasone-Salmeterol (ADVAIR) 500-50 MCG/DOSE AEPB   Inhalation   Inhale 1 puff into the lungs 2 (two) times daily.   60 each   11   . gabapentin (NEURONTIN) 100 MG capsule   Oral   Take 200 mg by mouth 3 (three) times daily.         Marland Kitchen HYDROcodone-acetaminophen (NORCO/VICODIN) 5-325 MG per tablet   Oral   Take 1-2 tablets by mouth every 4 (four) hours as needed for moderate pain.   30 tablet   0   . HYDROcodone-homatropine (HYCODAN) 5-1.5 MG/5ML syrup   Oral   Take 5 mLs by mouth at bedtime as needed for  cough.   120 mL   0   . levofloxacin (LEVAQUIN) 750 MG tablet   Oral   Take 1 tablet (750 mg total) by mouth daily.   5 tablet   0   . metFORMIN (GLUCOPHAGE) 500 MG tablet   Oral   Take 1 tablet (500 mg total) by mouth 2 (two) times daily with a meal. 1 tablet by mouth twice daily for diabetes   60 tablet   5   . montelukast (SINGULAIR) 10 MG tablet   Oral   Take 1 tablet (10 mg total) by mouth at bedtime.   30 tablet   0   . omeprazole (PRILOSEC) 20 MG capsule   Oral   Take 1 capsule (20 mg total) by mouth daily.   30 capsule   3   . pantoprazole (PROTONIX) 40 MG tablet   Oral   Take 1 tablet (40 mg total) by mouth daily.   30 tablet   0   . polyethylene glycol (MIRALAX / GLYCOLAX) packet   Oral   Take 17 g by mouth daily as needed for mild constipation.   14 each   0   . polyethylene glycol powder (GLYCOLAX/MIRALAX) powder   Oral   Take 17 g by mouth 2 (two) times daily as needed.   3350 g   1   . predniSONE (DELTASONE) 20 MG tablet   Oral   Take 2 tablets (40 mg total) by mouth daily.   10 tablet   0   . simvastatin (ZOCOR) 20 MG tablet   Oral   Take 1 tablet (20 mg total) by mouth daily at 6 PM.   30 tablet   0   . tamsulosin (FLOMAX) 0.4 MG CAPS capsule   Oral   Take 2 capsules (0.8 mg total) by mouth daily after supper.   30 capsule   0   . tiotropium (SPIRIVA) 18 MCG inhalation capsule   Inhalation   Place 1 capsule (18 mcg total) into inhaler and inhale daily.   30 capsule   12    BP 115/93  Pulse 106  Temp(Src) 98.7 F (37.1 C) (Oral)  Resp 18  SpO2 94% Physical Exam  Constitutional: He is oriented to person, place, and time. He appears well-developed and well-nourished. He appears distressed.  HENT:  Head: Normocephalic and atraumatic.  Mouth/Throat: Oropharynx is clear and moist. No oropharyngeal exudate.  Eyes: Conjunctivae and EOM are normal. Pupils are equal, round, and reactive to light.  Neck: Normal range of motion.  Neck supple.  Cardiovascular: Normal rate and regular rhythm.   No murmur heard. Pulmonary/Chest: He is in respiratory distress. He has wheezes.  Tachypnea,  mildly increased work of breathing with scattered wheezing  Abdominal: Soft. There is no tenderness. There is no rebound and no guarding.  Musculoskeletal: Normal range of motion. He exhibits no edema and no tenderness.  Neurological: He is alert and oriented to person, place, and time. No cranial nerve deficit. He exhibits normal muscle tone. Coordination normal.  Skin: Skin is warm.    ED Course  Procedures (including critical care time) Labs Review Labs Reviewed  CBC WITH DIFFERENTIAL - Abnormal; Notable for the following:    Neutrophils Relative % 79 (*)    All other components within normal limits  COMPREHENSIVE METABOLIC PANEL - Abnormal; Notable for the following:    Glucose, Bld 408 (*)    BUN 37 (*)    GFR calc non Af Amer 63 (*)    GFR calc Af Amer 73 (*)    All other components within normal limits  D-DIMER, QUANTITATIVE - Abnormal; Notable for the following:    D-Dimer, Quant 0.65 (*)    All other components within normal limits  GLUCOSE, CAPILLARY - Abnormal; Notable for the following:    Glucose-Capillary 299 (*)    All other components within normal limits  POCT I-STAT 3, BLOOD GAS (G3+) - Abnormal; Notable for the following:    pO2, Arterial 60.0 (*)    Bicarbonate 27.2 (*)    All other components within normal limits  CG4 I-STAT (LACTIC ACID) - Abnormal; Notable for the following:    Lactic Acid, Venous 6.08 (*)    All other components within normal limits  TROPONIN I  PRO B NATRIURETIC PEPTIDE  INFLUENZA PANEL BY PCR (TYPE A & B, H1N1)  TROPONIN I  TROPONIN I  TROPONIN I  URINALYSIS, ROUTINE W REFLEX MICROSCOPIC   Imaging Review Ct Abdomen Pelvis Wo Contrast  07/07/2013   CLINICAL DATA:  Hypotension  EXAM: CT ABDOMEN AND PELVIS WITHOUT CONTRAST  TECHNIQUE: Multidetector CT imaging of the abdomen  and pelvis was performed following the standard protocol without intravenous contrast.  COMPARISON:  US RENAL dated 11/08/2012; CT ABD/PELV WO CM dated 02/06/2012; CT ANGIO CHEST W/CM &/OR WO/CM dated 07/07/2013  FINDINGS: Imaging at the lung bases is mildly degraded due to respiratory motion. Mild atheromatous aortic calcification and aortic ectasia without aneurysm. There is retained contrast within the nondilated renal collecting systems from contrast enhanced exam performed earlier today of the chest. Hepatic cysts and too small to characterize hepatic hypodensities are reidentified. Adrenal glands, spleen, and pancreas are unremarkable. Gallbladder not well visualized but grossly unremarkable due to motion artifact.  Bilateral iliac ectasia, right greater than left, 1.6 cm right and 1.2 cm left image 45. Normal appendix. No bowel wall thickening or focal segmental dilatation. Dense contrast within the otherwise normal-appearing bladder. Minimal trabeculation within the bladder lumen is incidentally noted.  No free air, ascites, or lymphadenopathy.  Multilevel disc degenerative changes noted in the spine with mild rightward curvature centered at L1. 4 mm anterolisthesis L4 on L5 noted. Minimal anterior wedging reidentified at T11.  IMPRESSION: No acute intra-abdominal or pelvic pathology.  Minimal trabeculation of the bladder wall without internal filling defect otherwise.   Electronically Signed   By: Conchita Paris M.D.   On: 07/07/2013 16:48   Ct Angio Chest Pe W/cm &/or Wo Cm  07/07/2013   CLINICAL DATA:  Short of breath and chest pain  EXAM: CT ANGIOGRAPHY CHEST WITH CONTRAST  TECHNIQUE: Multidetector CT imaging of the chest was performed using the standard protocol during bolus  administration of intravenous contrast. Multiplanar CT image reconstructions including MIPs were obtained to evaluate the vascular anatomy.  CONTRAST:  46mL OMNIPAQUE IOHEXOL 350 MG/ML SOLN  COMPARISON:  06/17/2013  FINDINGS: There  are no filling defects in the pulmonary arterial tree to suggest acute pulmonary thromboembolism.  9 mm prevascular node.  Normal thyroid gland.  Interstitial changes within the right lung are stable.  No acute bony deformity.  Stable appearance of the liver.  Review of the MIP images confirms the above findings.  IMPRESSION: No evidence of acute pulmonary thromboembolism.   Electronically Signed   By: Maryclare Bean M.D.   On: 07/07/2013 13:44   Dg Chest Portable 1 View  07/07/2013   CLINICAL DATA:  Chest pain.  Shortness of breath.  EXAM: PORTABLE CHEST - 1 VIEW  COMPARISON:  06/17/2013  FINDINGS: The heart size and mediastinal contours are within normal limits. Both lungs are clear. The visualized skeletal structures are unremarkable.  IMPRESSION: No active disease.   Electronically Signed   By: Earle Gell M.D.   On: 07/07/2013 11:13    EKG Interpretation    Date/Time:  Saturday July 07 2013 10:43:27 EST Ventricular Rate:  108 PR Interval:  151 QRS Duration: 89 QT Interval:  341 QTC Calculation: 457 R Axis:   -91 Text Interpretation:  Sinus tachycardia Biatrial enlargement Left anterior fascicular block Right ventricular hypertrophy ST elevation, consider inferior injury No significant change was found Confirmed by Wyvonnia Dusky  MD, Avayah Raffety ([id number]) on 07/07/2013 10:46:02 AM Also confirmed by Wyvonnia Dusky  MD, Naleah Kofoed ([id number]), editor Gilford Rile, SANDRA [id number])  on 07/07/2013 12:21:20 PM            MDM   1. COPD exacerbation    Shortness of breath, cough and wheezing with history of COPD. Patient with mild distress on arrival with scattered expiratory wheezing.  He is given nebulizers. He received Solu-Medrol by EMS. Chest x-ray shows no infiltrate. Will start levaquin for bronchitis. ABG showed compensated respiratory acidosis.  Patient had episode of hypotension to the 70s in the ED. He denies any associated symptoms with this. Blood pressure responded IV fluids to 532 systolic. CT angio chest  showed no evidence of PE or aortic pathology. No evidence of AAA. Lactate 6.  Likely 2/2 dehydration.  Patient does not appear septic or toxic.    Wil need admission for ongoing wheezing and dyspnea.  BP 115/93  Pulse 106  Temp(Src) 98.7 F (37.1 C) (Oral)  Resp 18  SpO2 94%    Ezequiel Essex, MD 07/07/13 [phone]

## 2013-07-07 NOTE — ED Notes (Signed)
Respiratory at bedside.

## 2013-07-07 NOTE — ED Notes (Signed)
Pt had spo2 of 98 when lying in bed with 2 liters of o2 running.  Once o2 was discontinued and pt sat up on the edge of the bed spo2 had dropped to 92 and pt seemed weak.  Pt still attempted to stand and was very shaky, was not able to stay standing for very long and spo2 dropped to 90.

## 2013-07-07 NOTE — H&P (Signed)
[facility] Admission History and Physical Service Pager: [phone]  Patient name: Nathaniel [name] Medical record number: [id number] Date of birth: August 28, 1933 Age: 80 y.o. Gender: male  Primary Care Provider: [name], DO Consultants: None Code Status: Full  Chief Complaint: Inc WOB  Assessment and Plan: Nathaniel [name] is a 80 y.o. male presenting with increased WOB likely COPD exacerbation and fall vs syncope. PMH is significant for COPD, DM, HTN, HLD, BPH.   # COPD exacerbation - ABG shows compensated respiratory acidosis; [place] O2 to keep stats 88-92% - Continue: Dulera, Singular, Tessalon, Tussionex - Duonebs q4hrs; Albuterol q2prn - Doxy Day 1; (1/24>>) s/p Levaquin in ED - Prednisone 50mg  qd; Solu-Medrol given in EMS(Recently treated w/ Pred 40 mg x 5 days 1/13>11/17) - Flu PCR: pending; Pt's VS responding to IVF will hold off on Tamiflu  - PT/OT: eval & Treat - would consider chronic steroids vs prolonged taper given that this is the 3rd exacerbation this month.  # Hypotension/SIRS - Lactic Acid 6.08 on admit inc from 2.27 two wks ago. Possible respiratory source of infection but CXR clear. Will check UA . Reports decrease by mouth intake and vomiting prior to admission; Vitals likely due to volume depletion and responding well to IVFs. CTA chest/abdomen/pelvis was negative for PE or aortic dissection - 1L bolus then NS @ 125; s/p 2L in ED - Abx & Steroids as above  - Consider ECHO +/- Cards c/s if BP unresponsive to IVF  # Dizziness  - Most likely related to volume depletion versus viral syndrome; Hgb wnl - Orthostatics: pending - Will consider additional workup if still present after rehydration  # DM - A1c 8 (06/17/13) Holding Metformin (post contrast); Continue Gabapentin - SSI (sensitive) while inpatient  - may need to adjust SS while pt on steroids  Chronic conditions # Urinary retention, Hx prostate CA: Finasteride & Flomax # HLD: Zocor #  GERD: PPI  FEN/GI: NS @ 125; reg diet Prophylaxis: heparin  Disposition: Admit to step down for hypotension; Attending [place];   History of Present Illness: Nathaniel [name] is a 80 y.o. male presenting with dizziness and vomiting. Complete history is difficult to obtain due to language and cultural limits despite using language line.  He reports a fall this morning around 6am.  It was preceded by dizziness and he states he thinks he passed out for 10 minutes and had a lot of trouble breathing.  He denies hitting his head during fall or any current muscle skeletal pain.  He reports recent increasing cough and decreased by mouth intake. Reports some chest pain that is only present when he coughs.  No fevers or chills.    He was admitted in early January for COPD exacerbation and treated as an outpatient for COPD exacerbation in med January.    Review Of Systems: Per HPI with the following additions:  Otherwise 12 point review of systems was performed and was unremarkable.  Patient Active Problem List   Diagnosis Date Noted  . COPD exacerbation 06/17/2013  . 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 GERD (gastroesophageal reflux disease) 01/03/2012  . HTN (hypertension) 09/16/2011  . Hyperlipidemia 09/16/2011  . Diabetes mellitus 09/16/2011  . Neuropathy 09/16/2011  . PULMONARY NODULE 10/02/2008  . Essential hypertension, benign 01/10/2008  . DIABETES MELLITUS 12/14/2007  . HYPERTHYROIDISM, SUBCLINICAL 11/09/2007  . COPD 11/09/2007  . INSOMNIA 11/09/2007   Past Medical History: Past Medical History  Diagnosis Date  . Hyperlipidemia   . Hypertension   .  Diabetic neuropathy     /H&P 01/30/2013 (01/30/2013)  . Diabetes mellitus   . BPH (benign prostatic hyperplasia)     /H&P 01/30/2013 (01/30/2013)  . Emphysematous cystitis     /H&P 01/30/2013 (01/30/2013)  . COPD (chronic obstructive pulmonary disease)   . Orthopnoea    Past Surgical History: Past Surgical History  Procedure Laterality Date   . No past surgeries     Social History: History  Substance Use Topics  . Smoking status: Former Smoker -- 0.25 packs/day for 42 years    Types: Cigarettes  . Smokeless tobacco: Never Used     Comment: 01/30/2013 "quit smoking ~ 10 yr ago"  . Alcohol Use: No   Additional social history:   Please also refer to relevant sections of EMR.  Family History: History reviewed. No pertinent family history. Allergies and Medications: No Known Allergies No current facility-administered medications on file prior to encounter.   Current Outpatient Prescriptions on File Prior to Encounter  Medication Sig Dispense Refill  . albuterol (PROVENTIL) (2.5 MG/3ML) 0.083% nebulizer solution Take 3 mLs (2.5 mg total) by nebulization every 4 (four) hours as needed for wheezing or shortness of breath.  75 mL  12  . albuterol (VENTOLIN HFA) 108 (90 BASE) MCG/ACT inhaler Inhale 2 puffs into the lungs every 4 (four) hours while awake.  1 Inhaler  12  . benzonatate (TESSALON) 100 MG capsule Take 2 capsules (200 mg total) by mouth 2 (two) times daily as needed for cough.  30 capsule  1  . chlorpheniramine-HYDROcodone (TUSSIONEX PENNKINETIC ER) 10-8 MG/5ML LQCR Take 5 mLs by mouth every 12 (twelve) hours as needed for cough.  120 mL  0  . finasteride (PROSCAR) 5 MG tablet Take 1 tablet (5 mg total) by mouth daily.  30 tablet  0  . Fluticasone-Salmeterol (ADVAIR) 500-50 MCG/DOSE AEPB Inhale 1 puff into the lungs 2 (two) times daily.  60 each  11  . gabapentin (NEURONTIN) 100 MG capsule Take 200 mg by mouth 3 (three) times daily.      Marland Kitchen HYDROcodone-acetaminophen (NORCO/VICODIN) 5-325 MG per tablet Take 1-2 tablets by mouth every 4 (four) hours as needed for moderate pain.  30 tablet  0  . HYDROcodone-homatropine (HYCODAN) 5-1.5 MG/5ML syrup Take 5 mLs by mouth at bedtime as needed for cough.  120 mL  0  . levofloxacin (LEVAQUIN) 750 MG tablet Take 1 tablet (750 mg total) by mouth daily.  5 tablet  0  . metFORMIN  (GLUCOPHAGE) 500 MG tablet Take 1 tablet (500 mg total) by mouth 2 (two) times daily with a meal. 1 tablet by mouth twice daily for diabetes  60 tablet  5  . montelukast (SINGULAIR) 10 MG tablet Take 1 tablet (10 mg total) by mouth at bedtime.  30 tablet  0  . omeprazole (PRILOSEC) 20 MG capsule Take 1 capsule (20 mg total) by mouth daily.  30 capsule  3  . pantoprazole (PROTONIX) 40 MG tablet Take 1 tablet (40 mg total) by mouth daily.  30 tablet  0  . polyethylene glycol (MIRALAX / GLYCOLAX) packet Take 17 g by mouth daily as needed for mild constipation.  14 each  0  . polyethylene glycol powder (GLYCOLAX/MIRALAX) powder Take 17 g by mouth 2 (two) times daily as needed.  3350 g  1  . predniSONE (DELTASONE) 20 MG tablet Take 2 tablets (40 mg total) by mouth daily.  10 tablet  0  . simvastatin (ZOCOR) 20 MG tablet Take 1  tablet (20 mg total) by mouth daily at 6 PM.  30 tablet  0  . tamsulosin (FLOMAX) 0.4 MG CAPS capsule Take 2 capsules (0.8 mg total) by mouth daily after supper.  30 capsule  0  . tiotropium (SPIRIVA) 18 MCG inhalation capsule Place 1 capsule (18 mcg total) into inhaler and inhale daily.  30 capsule  12    Objective: BP 104/86  Pulse 109  Temp(Src) 98.7 F (37.1 C) (Oral)  Resp 18  SpO2 99% Exam: General: Thin, Frail appear male HEENT: Oral mucosa dry Cardiovascular: RRR no m/r/g; No JVD;  Respiratory: dec breath sounds; No wheezes or rhonchi Abdomen: SNTND Extremities: No LE edema; DP pulses +1; Cool to the touch  Labs and Imaging: CBC BMET   Recent Labs Lab 07/07/13 1117  WBC 9.2  HGB 14.4  HCT 41.6  PLT 202    Recent Labs Lab 07/07/13 1117  NA 139  K 3.9  CL 96  CO2 27  BUN 37*  CREATININE 1.10  GLUCOSE 408*  CALCIUM 8.8    Lactic Acid 8.0 Ddimer 0.65   Recent Labs Lab 07/07/13 1117  TROPONINI <0.30   BNP    Component Value Date/Time   PROBNP 36.9 07/07/2013 1117    Recent Labs Lab 07/07/13 1546  GLUCAP 299*   CTA  Chest/Ab/Pelvis 1/24: No evidence of acute pulmonary thromboembolism; No acute intra-abdominal or pelvic pathology. CXR 1/24: No active disease. EKG 1/24: Sinus tachycardia w/ LVH   Phill Myron, MD 07/07/2013, 2:46 PM PGY-1, [place] Intern pager: [phone], text pages welcome  PGY-3 Addendum I have seen and examined this patient.  I agree with the above note with my edits in pink.  Sumiya Mamaril, [place] 07/07/2013, 7:22 PM

## 2013-07-07 NOTE — ED Notes (Signed)
Pt presents to department via [facility] from home for evaluation of SOB and chest pain. Onset today Audible wheezing upon arrival. Received (1) albuterol/atrovent treatment and 125mg  solu-medrol per EMS. CBG 340. Sinus tachycardia on monitor. States chest pain radiates to back. Pt is alert and oriented x4. Does not speak english. 22g L hand.

## 2013-07-07 NOTE — ED Notes (Signed)
Mask placed on pt for precaution and transported to 3S09

## 2013-07-07 NOTE — ED Notes (Signed)
Pt transported to CT scan.

## 2013-07-07 NOTE — ED Notes (Signed)
EDP at bedside. Pt noted to be hypotensive. Denies pain. He remains alert and oriented x4.

## 2013-07-07 NOTE — ED Notes (Signed)
Pt attempted to stand and walk but was very unsteady and spo2 dropped to 91 from 98 when stood up from bed. Pt was gasping for air so he just got back in bed.

## 2013-07-08 ENCOUNTER — Inpatient Hospital Stay (HOSPITAL_COMMUNITY): Payer: PRIVATE HEALTH INSURANCE

## 2013-07-08 LAB — BASIC METABOLIC PANEL
BUN: 34 mg/dL — AB (ref 6–23)
CALCIUM: 8 mg/dL — AB (ref 8.4–10.5)
CO2: 23 mEq/L (ref 19–32)
CREATININE: 0.77 mg/dL (ref 0.50–1.35)
Chloride: 104 mEq/L (ref 96–112)
GFR calc Af Amer: >90 mL/min (ref >90)
GFR, EST NON AFRICAN AMERICAN: 85 mL/min — AB (ref >90)
GLUCOSE: 374 mg/dL — AB (ref 70–99)
Potassium: 4.6 mEq/L (ref 3.7–5.3)
Sodium: 140 mEq/L (ref 137–147)

## 2013-07-08 LAB — CBC
HCT: 35 % — ABNORMAL LOW (ref 39.0–52.0)
Hemoglobin: 11.6 g/dL — ABNORMAL LOW (ref 13.0–17.0)
MCH: 28.5 pg (ref 26.0–34.0)
MCHC: 33.1 g/dL (ref 30.0–36.0)
MCV: 86 fL (ref 78.0–100.0)
PLATELETS: 180 10*3/uL (ref 150–400)
RBC: 4.07 MIL/uL — ABNORMAL LOW (ref 4.22–5.81)
RDW: 13.1 % (ref 11.5–15.5)
WBC: 11.2 10*3/uL — ABNORMAL HIGH (ref 4.0–10.5)

## 2013-07-08 LAB — INFLUENZA PANEL BY PCR (TYPE A & B)
H1N1 flu by pcr: NOT DETECTED
INFLAPCR: NEGATIVE
Influenza B By PCR: NEGATIVE

## 2013-07-08 LAB — URINALYSIS, ROUTINE W REFLEX MICROSCOPIC
BILIRUBIN URINE: NEGATIVE
Glucose, UA: >1000 mg/dL — AB
Ketones, ur: 15 mg/dL — AB
Leukocytes, UA: NEGATIVE
Nitrite: NEGATIVE
PROTEIN: NEGATIVE mg/dL
Specific Gravity, Urine: >1.046 — ABNORMAL HIGH (ref 1.005–1.030)
UROBILINOGEN UA: 0.2 mg/dL (ref 0.0–1.0)
pH: 5 (ref 5.0–8.0)

## 2013-07-08 LAB — GLUCOSE, CAPILLARY
GLUCOSE-CAPILLARY: 336 mg/dL — AB (ref 70–99)
GLUCOSE-CAPILLARY: 206 mg/dL — AB (ref 70–99)
GLUCOSE-CAPILLARY: 192 mg/dL — AB (ref 70–99)
Glucose-Capillary: 243 mg/dL — ABNORMAL HIGH (ref 70–99)

## 2013-07-08 LAB — URINE MICROSCOPIC-ADD ON

## 2013-07-08 LAB — LACTIC ACID, PLASMA: Lactic Acid, Venous: 1.8 mmol/L (ref 0.5–2.2)

## 2013-07-08 LAB — TROPONIN I

## 2013-07-08 MED ORDER — IPRATROPIUM-ALBUTEROL 0.5-2.5 (3) MG/3ML IN SOLN
3.0000 mL | Freq: Three times a day (TID) | RESPIRATORY_TRACT | Status: DC
  Administered 2013-07-08 – 2013-07-09 (×4): 3 mL via RESPIRATORY_TRACT
  Filled 2013-07-08 (×4): qty 3

## 2013-07-08 NOTE — Progress Notes (Signed)
Pt transferred to 2W13 via bed, report called to Kim/RN. Belongings sent with pt to new room. Central Tele notified of room transfer, pt stable at time of transfer. No complaints of pain or SOB

## 2013-07-08 NOTE — Progress Notes (Signed)
Family Medicine Teaching Service Daily Progress Note Intern Pager: [phone]  Patient name: Nathaniel [name] Medical record number: [id number] Date of birth: [birth year] Age: 80 y.o. Gender: male  Primary Care Provider: [name], DO Consultants: None Code Status: Full  Pt Overview and Major Events to Date:  1/24: Admitted with COPD, very dry on exam 1/25: dysnea improved some, still weak, continue fluids  Assessment and Plan: Tarquin Welcher is a 80 y.o. male presenting with increased WOB likely COPD exacerbation and fall vs syncope. PMH is significant for COPD, DM, HTN, HLD, BPH.   # COPD exacerbation  - ABG shows compensated respiratory acidosis; [place] O2 to keep stats 88-92%  - Continue: Dulera, Singular, Tessalon, Tussionex  - Duonebs q4hrs; Albuterol q2prn  - Doxy Day 1; (1/24>>) s/p Levaquin in ED  - Prednisone 50mg  qd; Solu-Medrol given in EMS(Recently treated w/ Pred 40 mg x 5 days 1/13>11/17)  - Flu PCR: pending; Pt's VS responding to IVF will hold off on Tamiflu  - PT/OT: eval & Treat  - would consider chronic steroids vs prolonged taper given that this is the 3rd exacerbation this month.   # Hypotension/SIRS  - Lactic Acid 6.08 on admit inc from 2.27 two wks ago now normalized.  - UA looks very dry, Reports decrease by mouth intake and vomiting prior to admission; Vitals likely due to volume depletion and responding well to IVFs. CTA chest/abdomen/pelvis was negative for PE or aortic dissection  - Abx & Steroids as above   # Dizziness / syncope - no additional syncopal epsiodes.  - Most likely related to volume depletion versus viral syndrome; Hgb wnl  - Orthostatics: pending   # DM  - A1c 8 (06/17/13) Holding Metformin (post contrast); Continue Gabapentin  - SSI (sensitive) while inpatient - may need to adjust SS while pt on steroids - consider adding basal vs escalating scale, concerned he be brittle with his body habitus  Chronic conditions  # Urinary retention, Hx prostate  CA: Finasteride & Flomax  # HLD: Zocor  # GERD: PPI   FEN/GI: NS @ 125; reg diet  Prophylaxis: heparin   Disposition: Transfer to tele, Dc when clinically improved from COPD standpoint  Subjective: Breathing improved but continues to wax and wain, feeling weak and tired. Concerned to go home because not much family support and still feels very weak.   Objective: Temp:  [97.4 F (36.3 C)-98.7 F (37.1 C)] 97.4 F (36.3 C) (01/25 0410) Pulse Rate:  [96-114] 106 (01/25 0410) Resp:  [18-24] 20 (01/25 0410) BP: (71-125)/(48-93) 105/59 mmHg (01/25 0410) SpO2:  [94 %-99 %] 94 % (01/25 0834) Weight:  [94 lb 2.2 oz (42.7 kg)] 94 lb 2.2 oz (42.7 kg) (01/24 1817) Physical Exam: Gen: NAD, alert, cooperative with exam, frail appearing elderly man, eating breakfast HEENT: NCAT, MMM CV: RRR, good S1/S2, no murmur Resp: Slight exp wheezes, decreased breath sounds throughout Abd: SNTND, BS present, no guarding or organomegaly Ext: No edema, warm, 2+ DP pulses Neuro: Alert and oriented, No gross deficits  Laboratory:  Recent Labs Lab 07/07/13 1117 07/08/13 0314  WBC 9.2 11.2*  HGB 14.4 11.6*  HCT 41.6 35.0*  PLT 202 180    Recent Labs Lab 07/07/13 1117 07/08/13 0314  NA 139 140  K 3.9 4.6  CL 96 104  CO2 27 23  BUN 37* 34*  CREATININE 1.10 0.77  CALCIUM 8.8 8.0*  PROT 7.3  --   BILITOT 0.4  --   ALKPHOS 73  --  ALT 12  --   AST 10  --   GLUCOSE 408* 374*      Recent Labs Lab 07/07/13 1117 07/07/13 1701 07/07/13 2235 07/08/13 0314  TROPONINI <0.30 <0.30 <0.30 <0.30     Imaging/Diagnostic Tests:  CTA Chest/Ab/Pelvis 1/24: No evidence of acute pulmonary thromboembolism; No acute intra-abdominal or pelvic pathology.  CXR 1/24: No active disease.  EKG 1/24: Sinus tachycardia w/ LVH    Timmothy Euler, MD 07/08/2013, 9:24 AM PGY-2, [place] Intern pager: [phone], text pages welcome

## 2013-07-08 NOTE — Evaluation (Signed)
Physical Therapy Evaluation Patient Details Name: Nathaniel [name] MRN: [id number] DOB: 11-25-1933 Today's Date: 07/08/2013 Time: 8546-2703 PT Time Calculation (min): 24 min  PT Assessment / Plan / Recommendation History of Present Illness  Pt admit for COPD exacerbation.    Clinical Impression  Pt admitted with above. Pt currently with functional limitations due to the deficits listed below (see PT Problem List). Pt needs short term NHP at d/c.  Pt alone during day and very unsteady gait.   Pt will benefit from skilled PT to increase their independence and safety with mobility to allow discharge to the venue listed below.     PT Assessment  Patient needs continued PT services    Follow Up Recommendations  SNF;Supervision/Assistance - 24 hour         Barriers to Discharge Decreased caregiver support      Equipment Recommendations  Rolling walker with 5" wheels;3in1 (PT)         Frequency Min 3X/week    Precautions / Restrictions Precautions Precautions: Fall Restrictions Weight Bearing Restrictions: No   Pertinent Vitals/Pain VSS, no pain      Mobility  Bed Mobility Overal bed mobility: Independent Transfers Overall transfer level: Needs assistance Equipment used: None Transfers: Sit to/from [facility] Sit to Stand: Min guard Stand pivot transfers: Min guard General transfer comment: Needed steadying assist.Got pt on 3N1 and pt needing assist to clean himself when done.   Ambulation/Gait Ambulation/Gait assistance: Min guard Ambulation Distance (Feet): 75 Feet Assistive device: Rolling walker (2 wheeled);None Gait Pattern/deviations: Decreased stride length;Shuffle;Ataxic;Wide base of support;Leaning posteriorly Gait velocity interpretation: Below normal speed for age/gender General Gait Details: Pt demonstrates instability posteriorly with gait.  Ambulates with wide BOS with shuffle steps with decr balance reactions and needing facilitation for balance.   Attempted use of RW for stability  with pt needing cues for sequencing and to stay close to RW.  Feel with practice this will get better.            PT Diagnosis: Difficulty walking  PT Problem List: Decreased activity tolerance;Decreased balance;Decreased mobility;Decreased knowledge of use of DME PT Treatment Interventions: DME instruction;Gait training;Functional mobility training;Therapeutic activities;Therapeutic exercise;Balance training;Patient/family education     PT Goals(Current goals can be found in the care plan section) Acute Rehab PT Goals Patient Stated Goal: go home PT Goal Formulation: With patient Time For Goal Achievement: 07/15/13 Potential to Achieve Goals: Good  Visit Information  Last PT Received On: 07/08/13 Assistance Needed: +1 History of Present Illness: Pt admit for COPD exacerbation.         Prior [place] expects to be discharged to:: Private residence Living Arrangements: Children Available Help at Discharge: Available PRN/intermittently;Family (daughter works per pt) Type of Home: Apartment Home Access: Level entry Home Layout: One [place]: None Additional Comments: [place] interpreter via phone Prior Function Level of Independence: Independent Communication Communication: Prefers language other than English;Interpreter utilized    [facility] Arousal/Alertness: Awake/alert Behavior During Therapy: WFL for tasks assessed/performed Overall Cognitive Status: Within Functional Limits for tasks assessed    Extremity/Trunk Assessment Upper Extremity Assessment Upper Extremity Assessment: Defer to OT evaluation Lower Extremity Assessment Lower Extremity Assessment: Generalized weakness   Balance Balance Overall balance assessment: History of Falls;Needs assistance Postural control: Posterior lean Standing balance support: No upper extremity supported;During functional  activity Standing balance-Leahy Scale: Fair Standing balance comment: Needs UE support.  Even with UE support pt unbalanced.  needs further therapy for safety.  High level balance activites: Direction changes;Turns;Sudden stops High Level Balance Comments: min assist  End of Session PT - End of Session Equipment Utilized During Treatment: Gait belt Activity Tolerance: Patient limited by fatigue Patient left: in chair;with call bell/phone within reach Nurse Communication: Mobility status       INGOLD,Sitlaly Gudiel 07/08/2013, 11:28 AM [facility] [phone] (pager)

## 2013-07-08 NOTE — Progress Notes (Signed)
Copied from my co-sign of the H&PE todays: Seen and examined (Daughter, Earnest Bailey provided translation).  Discussed with Dr. Bridgett Larsson.  Agree with her documentation and management.  Briefly, 80 yo male with language barrier presents again with a COPD exacerbation.  He also has a near syncope event - was able to lower himself and not fall - no injury.  Exam consistent with COPD exacerbation.  Feels better today, eating well.    I am bothered by frequent hospitalization and worry that I am missing some chance to intervene and break this cycle.  For now, it seems like a fairly straightforward COPD exac.

## 2013-07-08 NOTE — H&P (Signed)
Seen and examined (Daughter, Earnest Bailey provided translation).  Discussed with Dr. Bridgett Larsson.  Agree with her documentation and management.  Briefly, 80 yo male with language barrier presents again with a COPD exacerbation.  He also has a near syncope event - was able to lower himself and not fall - no injury.  Exam consistent with COPD exacerbation.  Feels better today, eating well.    I am bothered by frequent hospitalization and worry that I am missing some chance to intervene and break this cycle.  For now, it seems like a fairly straightforward COPD exac.

## 2013-07-09 ENCOUNTER — Telehealth: Payer: Self-pay

## 2013-07-09 LAB — CBC
HCT: 37.1 % — ABNORMAL LOW (ref 39.0–52.0)
Hemoglobin: 12.4 g/dL — ABNORMAL LOW (ref 13.0–17.0)
MCH: 29 pg (ref 26.0–34.0)
MCHC: 33.4 g/dL (ref 30.0–36.0)
MCV: 86.7 fL (ref 78.0–100.0)
Platelets: 178 10*3/uL (ref 150–400)
RBC: 4.28 MIL/uL (ref 4.22–5.81)
RDW: 13.3 % (ref 11.5–15.5)
WBC: 11.3 10*3/uL — ABNORMAL HIGH (ref 4.0–10.5)

## 2013-07-09 LAB — BASIC METABOLIC PANEL
BUN: 25 mg/dL — AB (ref 6–23)
CALCIUM: 8.4 mg/dL (ref 8.4–10.5)
CO2: 22 mEq/L (ref 19–32)
Chloride: 101 mEq/L (ref 96–112)
Creatinine, Ser: 0.8 mg/dL (ref 0.50–1.35)
GFR calc Af Amer: >90 mL/min (ref >90)
GFR calc non Af Amer: 84 mL/min — ABNORMAL LOW (ref >90)
Glucose, Bld: 175 mg/dL — ABNORMAL HIGH (ref 70–99)
Potassium: 4.5 mEq/L (ref 3.7–5.3)
Sodium: 136 mEq/L — ABNORMAL LOW (ref 137–147)

## 2013-07-09 LAB — GLUCOSE, CAPILLARY
Glucose-Capillary: 183 mg/dL — ABNORMAL HIGH (ref 70–99)
Glucose-Capillary: 260 mg/dL — ABNORMAL HIGH (ref 70–99)
Glucose-Capillary: 180 mg/dL — ABNORMAL HIGH (ref 70–99)

## 2013-07-09 MED ORDER — PREDNISONE 50 MG PO TABS: ORAL_TABLET | ORAL | Status: DC

## 2013-07-09 MED ORDER — BENZONATATE 200 MG PO CAPS: 200.0000 mg | ORAL_CAPSULE | Freq: Three times a day (TID) | ORAL | Status: DC | PRN

## 2013-07-09 MED ORDER — DOXYCYCLINE HYCLATE 100 MG PO CAPS: 100.0000 mg | ORAL_CAPSULE | Freq: Two times a day (BID) | ORAL | Status: DC

## 2013-07-09 NOTE — Progress Notes (Signed)
Using the interpreter phone service, explained to patient that he had a discharge order and to call his daughter to pick him up.  Patient attempted to call his daughter but states she much have left her house because she is not picking up the phone.  Patient states he will try again.  RN attempted to contact daughter using the phone numbers on file; mobile number was not in service and home phone number is a mailbox.  Will continue to monitor.

## 2013-07-09 NOTE — Discharge Instructions (Signed)
Nathaniel [name] was admitted for a COPD exacerbation. You recovered well with the help of steroids and antibiotics. You will need to discuss with your primary doctor about the possible treatment of long term steroid use due to your recent bout of exacerbations.   Please follow up with your primary doctor in at least one week. If you are having any of the similar symptoms that brought you to the hospital, then please call your doctor.    Metformin and X-ray Contrast Studies For some X-ray exams, a contrast dye is used. Contrast dye is a type of medicine used to make the X-ray image clearer. The contrast dye is given to the patient through a vein (intravenously). If you need to have this type of X-ray exam and you take a medication called metformin, your caregiver may have you stop taking metformin before the exam.  LACTIC ACIDOSIS In rare cases, a serious medical condition called lactic acidosis can develop in people who take metformin and receive contrast dye. The following conditions can increase the risk of this complication:   Kidney failure.  Liver problems.  Certain types of heart problems such as:  Heart failure.  Heart attack.  Heart infection.  Heart valve problems.  Alcohol abuse. If left untreated, lactic acidosis can lead to coma.  SYMPTOMS OF LACTIC ACIDOSIS Symptoms of lactic acidosis can include:  Rapid breathing (hyperventilation).  Neurologic symptoms such as:  Headaches.  Confusion.  Dizziness.  Excessive sweating.  Feeling sick to your stomach (nauseous) or throwing up (vomiting). AFTER THE X-RAY EXAM  Stay well-hydrated. Drink fluids as instructed by your caregiver.  If you have a risk of developing lactic acidosis, blood tests may be done to make sure your kidney function is okay.  Metformin is usually stopped for 48 hours after the X-ray exam. Ask your caregiver when you can start taking metformin again. SEEK MEDICAL CARE IF:   You have shortness of  breath or difficulty breathing.  You develop a headache that does not go away.  You have nausea or vomiting.  You urinate more than normal.  You develop a skin rash and have:  Redness.  Swelling.  Itching. Document Released: 05/19/2009 Document Revised: 08/23/2011 Document Reviewed: 05/19/2009 [facility] Patient Information 2014 [place], [place].    [facility] to continue Home Health services at discharge.   Phone:  [phone]

## 2013-07-09 NOTE — Discharge Summary (Signed)
[facility] Discharge Summary  Patient name: Nathaniel [name] Medical record number: [id number] Date of birth: 07-19-1933 Age: 80 y.o. Gender: male Date of Admission: 07/07/2013  Date of Discharge: 07/09/13 Admitting Physician: Zigmund Gottron, MD  Primary Care Provider: [name], DO Consultants: none  Indication for Hospitalization: increased work of breathing   Discharge Diagnoses/Problem List:  Patient Active Problem List   Diagnosis Date Noted  . COPD with exacerbation 07/07/2013  . COPD exacerbation 06/17/2013  . 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 GERD (gastroesophageal reflux disease) 01/03/2012  . HTN (hypertension) 09/16/2011  . Hyperlipidemia 09/16/2011  . Diabetes mellitus 09/16/2011  . Neuropathy 09/16/2011  . PULMONARY NODULE 10/02/2008  . Essential hypertension, benign 01/10/2008  . DIABETES MELLITUS 12/14/2007  . HYPERTHYROIDISM, SUBCLINICAL 11/09/2007  . COPD 11/09/2007  . INSOMNIA 11/09/2007   Disposition: home   Discharge Condition: improved   Discharge Exam:  Gen: NAD, alert, cooperative with exam,  HEENT: NCAT, MMM  CV: RRR, good S1/S2, no murmur  Resp: breath sounds are clear but faint.  Abd: SNTND, BS present, no guarding or organomegaly  Ext: No edema, warm, 2+ DP pulses  Neuro: Alert and oriented, No gross deficits  Brief Hospital Course:  Nathaniel [name] is a 80 y.o. male presenting with increased WOB likely COPD exacerbation and fall vs syncope. PMH is significant for COPD, DM, HTN, HLD, BPH.   # COPD exacerbation: Initially presented with increased work of breathing. He was had recent admission in early January for a COPD exacerbation and was treated as an outpatient for a COPD exacerbation.  An ABG showed respiratory acidosis.  He was given Levaquin and solumedrol in the ED. He was started on doxycyline once presenting to the floor.  He was continued on his home medications (Dulera, Singular, Tessalon, Tussionex).   He was given prednisone 50 mg in the thought of during a steroid taper due to his multiple COPD exacerbations in recent weeks. PT recommended SNF but patient refused.  He had improved to baseline with no nasal cannula prior to discharge.  He was sent home to complete a 7 day course of antibiotics and a steroid taper, decreasing by 10 mg every 5 days until a baseline of 10 mg could be reached.   # Hypotension/SIRS: Lactic acid of 6.08 on admission but no signs of infection. Chest x-ray and urinalysis were clear.  Most likely due to volume depletion which the urinalysis showed an elevated specific gravity.  Lactic acid returned to normal. The hypotension resolved with fluids resuscitation.   # Dizziness / syncope: Most likely related to volume depletion versus viral syndrome. He was ambulating with no symptoms prior to discharge.   # DM: A1c 8 (06/17/13)  Metformin (post contrast) was held but continued on discharge as well as Gabapentin. May need to adjust since he will be on a prednisone taper for a number of days.   Chronic conditions  # Urinary retention, Hx prostate CA: Finasteride & Flomax  # HLD: Zocor  # GERD: PPI   Issues for Follow Up:  - Patient has had at least 3 COPD exacerbations this Month. Consider chronic prednisone use. Sent home on a prednisone taper.  - Patient had some elevated blood sugars. Titration as an outpatient.  - f/u CBC. Hgb was 14.4 but this was supsected to be heme concentrated and 11.6 is more of his baseline.   Significant Procedures:   Significant Labs and Imaging:   Recent Labs Lab 07/07/13 1117 07/08/13  0314 07/09/13 1430  WBC 9.2 11.2* 11.3*  HGB 14.4 11.6* 12.4*  HCT 41.6 35.0* 37.1*  PLT 202 180 178    Recent Labs Lab 07/07/13 1117 07/08/13 0314 07/09/13 1430  NA 139 140 136*  K 3.9 4.6 4.5  CL 96 104 101  CO2 27 23 22   GLUCOSE 408* 374* 175*  BUN 37* 34* 25*  CREATININE 1.10 0.77 0.80  CALCIUM 8.8 8.0* 8.4  ALKPHOS 73  --   --   AST  10  --   --   ALT 12  --   --   ALBUMIN 3.8  --   --     Recent Labs Lab 07/08/13 1709 07/08/13 2133 07/09/13 0635 07/09/13 1131 07/09/13 1619  GLUCAP 243* 192* 183* 260* 180*      Recent Labs Lab 07/07/13 1117 07/07/13 1701 07/07/13 2235 07/08/13 0314  TROPONINI <0.30 <0.30 <0.30 <0.30   Lactic acid: 8.0>1.8 Ddimer 0.65  Urinalysis    Component Value Date/Time   COLORURINE YELLOW 07/08/2013 [place] 07/08/2013 0431   LABSPEC >1.046* 07/08/2013 0431   PHURINE 5.0 07/08/2013 0431   GLUCOSEU >1000* 07/08/2013 0431   HGBUR LARGE* 07/08/2013 [place] 07/08/2013 0431   BILIRUBINUR neg 09/16/2012 1102   KETONESUR 15* 07/08/2013 0431   PROTEINUR NEGATIVE 07/08/2013 0431   UROBILINOGEN 0.2 07/08/2013 0431   UROBILINOGEN 0.2 09/16/2012 1102   NITRITE NEGATIVE 07/08/2013 0431   NITRITE pos 09/16/2012 1102   LEUKOCYTESUR NEGATIVE 07/08/2013 0431   CTA Chest/Ab/Pelvis 1/24: No evidence of acute pulmonary thromboembolism; No acute intra-abdominal or pelvic pathology.  CXR 1/24: No active disease.  EKG 1/24: Sinus tachycardia w/ LVH  Results/Tests Pending at Time of Discharge: none  Discharge Medications:    Medication List         albuterol 108 (90 BASE) MCG/ACT inhaler  Commonly known as:  VENTOLIN HFA  Inhale 2 puffs into the lungs every 4 (four) hours while awake.     albuterol (2.5 MG/3ML) 0.083% nebulizer solution  Commonly known as:  PROVENTIL  Take 3 mLs (2.5 mg total) by nebulization every 4 (four) hours as needed for wheezing or shortness of breath.     benzonatate 200 MG capsule  Commonly known as:  TESSALON  Take 1 capsule (200 mg total) by mouth 3 (three) times daily as needed for cough.     doxycycline 100 MG capsule  Commonly known as:  VIBRAMYCIN  Take 1 capsule (100 mg total) by mouth 2 (two) times daily.     finasteride 5 MG tablet  Commonly known as:  PROSCAR  Take 1 tablet (5 mg total) by mouth daily.      Fluticasone-Salmeterol 500-50 MCG/DOSE Aepb  Commonly known as:  ADVAIR  Inhale 1 puff into the lungs 2 (two) times daily.     gabapentin 100 MG capsule  Commonly known as:  NEURONTIN  Take 200 mg by mouth 3 (three) times daily.     metFORMIN 500 MG tablet  Commonly known as:  GLUCOPHAGE  Take 1 tablet (500 mg total) by mouth 2 (two) times daily with a meal. 1 tablet by mouth twice daily for diabetes     montelukast 10 MG tablet  Commonly known as:  SINGULAIR  Take 1 tablet (10 mg total) by mouth at bedtime.     omeprazole 20 MG capsule  Commonly known as:  PRILOSEC  Take 1 capsule (20 mg total) by mouth daily.  pantoprazole 40 MG tablet  Commonly known as:  PROTONIX  Take 1 tablet (40 mg total) by mouth daily.     polyethylene glycol powder powder  Commonly known as:  GLYCOLAX/MIRALAX  Take 17 g by mouth 2 (two) times daily as needed.     predniSONE 50 MG tablet  Commonly known as:  DELTASONE  - Please take 50 mg daily for three more days with breakfast.   - Please take 40 mg daily for 5 days at breakfast   - Please take 30 mg daily for 5 days at breakfast   - Please take 20 mg daily for 5 days at breakfast   - Please take 10 mg daily for 5 days at breakfast     simvastatin 20 MG tablet  Commonly known as:  ZOCOR  Take 1 tablet (20 mg total) by mouth daily at 6 PM.     tamsulosin 0.4 MG Caps capsule  Commonly known as:  FLOMAX  Take 2 capsules (0.8 mg total) by mouth daily after supper.     tiotropium 18 MCG inhalation capsule  Commonly known as:  SPIRIVA  Place 1 capsule (18 mcg total) into inhaler and inhale daily.        Discharge Instructions: Please refer to Patient Instructions section of EMR for full details.  Patient was counseled important signs and symptoms that should prompt return to medical care, changes in medications, dietary instructions, activity restrictions, and follow up appointments.   Follow-Up Appointments: Follow-up Information    Follow up with LE, THAO London, DO. (Please schedule an appt in one week. )    Specialty:  Family Medicine   Contact information:   [place] [id number] [place], MD 07/11/2013, 3:15 PM PGY-1, [place]

## 2013-07-09 NOTE — Evaluation (Signed)
Occupational Therapy Evaluation Patient Details Name: Nathaniel [name] MRN: [id number] DOB: 12/16/1933 Today's Date: 07/09/2013 Time: 8756-4332 OT Time Calculation (min): 26 min  OT Assessment / Plan / Recommendation History of present illness Pt admit for COPD exacerbation.     Clinical Impression   Pt presents with generalized weakness, unsteady gait and standing balance deficits interfering with ability perform ADL independently.  He tends to rush, further compromising his safety and depleting his energy.  Will follow acutely.  Recommending SNF as pt does not have 24 hour assist at home.   OT Assessment  Patient needs continued OT Services    Follow Up Recommendations  SNF    Barriers to Discharge Decreased caregiver support    Equipment Recommendations       Recommendations for Other Services    Frequency  Min 2X/week    Precautions / Restrictions Precautions Precautions: Fall Restrictions Weight Bearing Restrictions: No   Pertinent Vitals/Pain VSS, 96% on RA, no pain    ADL  Eating/Feeding: Independent Where Assessed - Eating/Feeding: Chair Grooming: Wash/dry hands;Wash/dry face;Teeth care;Min guard Where Assessed - Grooming: Unsupported standing Upper Body Bathing: Set up Where Assessed - Upper Body Bathing: Unsupported sitting Lower Body Bathing: Min guard Where Assessed - Lower Body Bathing: Supported standing Upper Body Dressing: Set up Where Assessed - Upper Body Dressing: Unsupported sitting Lower Body Dressing: Min guard Where Assessed - Lower Body Dressing: Unsupported sit to stand Toilet Transfer: Min Psychiatric nurse Method: Sit to Loss adjuster, chartered: Regular height toilet;Grab bars Toileting - Water quality scientist and Hygiene: Min guard Where Assessed - Best boy and Hygiene: Standing Equipment Used: Gait belt Transfers/Ambulation Related to ADLs: min- min guard assist for ambulation without a device, pt moves  quickly. ADL Comments: Needs assist primarily to slow his pace for energy conservation and for balance.    OT Diagnosis: Generalized weakness  OT Problem List: Impaired balance (sitting and/or standing);Decreased activity tolerance;Decreased knowledge of use of DME or AE;Cardiopulmonary status limiting activity OT Treatment Interventions: Self-care/ADL training;Patient/family education;Balance training;Energy conservation;DME and/or AE instruction   OT Goals(Current goals can be found in the care plan section) Acute Rehab OT Goals Patient Stated Goal: go home OT Goal Formulation: With patient Time For Goal Achievement: 07/16/13 Potential to Achieve Goals: Good ADL Goals Pt Will Perform Grooming: with modified independence;standing Pt Will Perform Lower Body Bathing: with modified independence;sit to/from stand Pt Will Perform Lower Body Dressing: with modified independence;sit to/from stand Pt Will Transfer to Toilet: with modified independence;ambulating;regular height toilet Pt Will Perform Toileting - Clothing Manipulation and hygiene: with modified independence;sit to/from stand Pt Will Perform Tub/Shower Transfer: Tub transfer;with supervision;ambulating;shower seat Additional ADL Goal #1: Pt will generalize pacing to conserve energy with min verbal cues during ADL.  Visit Information  Last OT Received On: 07/09/13 Assistance Needed: +1 History of Present Illness: Pt admit for COPD exacerbation.         Prior [place] expects to be discharged to:: Private residence Living Arrangements: Children Available Help at Discharge: Available PRN/intermittently;Family Type of Home: Apartment Home Access: Level entry Home Layout: One level Home Equipment: None Additional Comments: Pt able to understand [place], used interpreter for education. Prior Function Level of Independence: Independent Communication Communication: Prefers language other  than [place];Interpreter utilized Dominant Hand: Right         Vision/Perception Vision - History Baseline Vision: Wears glasses only for reading Patient Visual Report: No change from baseline   Cognition  Cognition Arousal/Alertness: Awake/alert Behavior During Therapy: WFL for tasks assessed/performed Overall Cognitive Status: Within Functional Limits for tasks assessed    Extremity/Trunk Assessment Upper Extremity Assessment Upper Extremity Assessment: Overall WFL for tasks assessed Lower Extremity Assessment Lower Extremity Assessment: Defer to PT evaluation Cervical / Trunk Assessment Cervical / Trunk Assessment: Normal     Mobility Bed Mobility Overal bed mobility: Independent Transfers Overall transfer level: Needs assistance Equipment used: None Transfers: Sit to/from Stand Sit to Stand: Min guard General transfer comment: Needed steadying assist.     Exercise     Balance Balance Standing balance-Leahy Scale: Fair   End of Session OT - End of Session Activity Tolerance: Patient tolerated treatment well Patient left: in chair;with call bell/phone within reach Nurse Communication: Mobility status  GO     Malka So 07/09/2013, 10:07 AM [phone]

## 2013-07-09 NOTE — Telephone Encounter (Signed)
Patient in hospital. Nathaniel [name] with [facility] notified

## 2013-07-09 NOTE — Telephone Encounter (Signed)
CHRIS FROM [facility] STATES SHE CALLED TO LET PT KNOW SHE WAS COMING OVER, BUT HE STATED HE HAS BEEN SICK FOR 3 DAYS ON THE COUCH WENT BY HIS HOUSE AND HE DIDN'T LET HER IN, ALSO CANCELLED LAST Friday AND SHE IS REALLY CONCERNED ABOUT HIM AND WAS HOPING WE COULD GET IN TOUCH WITH HIM. PLEASE CALL HER AT [phone]

## 2013-07-09 NOTE — Progress Notes (Signed)
Family Medicine Teaching Service Daily Progress Note Intern Pager: [phone]  Patient name: Nathaniel [name] Medical record number: [id number] Date of birth: 12/04/1933 Age: 80 y.o. Gender: male  Primary Care Provider: [name], DO Consultants: None Code Status: Full  Pt Overview and Major Events to Date:  1/24: Admitted with COPD, very dry on exam 1/25: dysnea improved some, still weak, continue fluids  Assessment and Plan: Nathaniel [name] is a 80 y.o. male presenting with increased WOB likely COPD exacerbation and fall vs syncope. PMH is significant for COPD, DM, HTN, HLD, BPH.   # COPD exacerbation: improved. No coughing breathing normally  - Continue: Dulera, Singular, Tessalon, Tussionex  - Duonebs q4hrs; Albuterol q2prn  - Doxy Day 1; (1/24>>) s/p Levaquin in ED  - Prednisone 50mg  qd Day2/5 - Flu PCR: negative - PT: SNF  - OT: eval & Treat  - would consider chronic steroids vs prolonged taper given that this is the 3rd exacerbation this month.   # Hypotension/SIRS: resolved.  - Abx & Steroids as above   # Dizziness / syncope: Most likely related to volume depletion versus viral syndrome; Hgb wnl - no additional syncopal epsiodes.  - Orthostatics: pending   # DM:  A1c 8 (06/17/13) Holding Metformin (post contrast); Continue Gabapentin  - SSI (sensitive) while inpatient - may need to adjust SS while pt on steroids - consider adding basal vs escalating scale, concerned he be brittle with his body habitus  Chronic conditions  # Urinary retention, Hx prostate CA: Finasteride & Flomax  # HLD: Zocor  # GERD: PPI   FEN/GI: SLIV; reg diet  Prophylaxis: heparin   Disposition: Transfer to tele, Dc when clinically improved from COPD standpoint  Subjective: He reports no coughing and feels much better today.   Objective: Temp:  [97.3 F (36.3 C)-99 F (37.2 C)] 97.3 F (36.3 C) (01/26 0430) Pulse Rate:  [102-117] 102 (01/26 0430) Resp:  [20-24] 20 (01/26 0430) BP:  (110-137)/(59-73) 137/73 mmHg (01/26 0430) SpO2:  [93 %-98 %] 96 % (01/26 0430) Weight:  [105 lb 9.6 oz (47.9 kg)] 105 lb 9.6 oz (47.9 kg) (01/25 2140) Physical Exam: Gen: NAD, alert, cooperative with exam,  HEENT: NCAT, MMM CV: RRR, good S1/S2, no murmur Resp: breath sounds are clear but faint.  Abd: SNTND, BS present, no guarding or organomegaly Ext: No edema, warm, 2+ DP pulses Neuro: Alert and oriented, No gross deficits  Laboratory:  Recent Labs Lab 07/07/13 1117 07/08/13 0314  WBC 9.2 11.2*  HGB 14.4 11.6*  HCT 41.6 35.0*  PLT 202 180    Recent Labs Lab 07/07/13 1117 07/08/13 0314  NA 139 140  K 3.9 4.6  CL 96 104  CO2 27 23  BUN 37* 34*  CREATININE 1.10 0.77  CALCIUM 8.8 8.0*  PROT 7.3  --   BILITOT 0.4  --   ALKPHOS 73  --   ALT 12  --   AST 10  --   GLUCOSE 408* 374*    Recent Labs Lab 07/08/13 0836 07/08/13 1206 07/08/13 1709 07/08/13 2133 07/09/13 0635  GLUCAP 336* 206* 243* 192* 183*    Recent Labs Lab 07/07/13 1117 07/07/13 1701 07/07/13 2235 07/08/13 0314  TROPONINI <0.30 <0.30 <0.30 <0.30   Imaging/Diagnostic Tests:  CTA Chest/Ab/Pelvis 1/24: No evidence of acute pulmonary thromboembolism; No acute intra-abdominal or pelvic pathology.  CXR 1/24: No active disease.  EKG 1/24: Sinus tachycardia w/ LVH   Rosemarie Ax, MD 07/09/2013, 9:32 AM PGY-1, [facility] Intern pager: [phone], text pages welcome

## 2013-07-09 NOTE — Progress Notes (Signed)
FMTS Attending Note Patient seen and examined by me today, discussed with resident team and I agree with Dr Raeford Razor' assessment and plan.  Patient is feeling much better, no cough or shortness of breath today.  Is eager to go home. Per Case Management notes, it appears that the patient declines the recommendation that he go to SNF for 24-hour supervision following discharge.  Instead, he will return home from the hospital.  I agree with continued oral prednisone burst (has never completed prior burst of 20/day from previous admission), and doxycycline, duonebs, Dulera and Singulair. Close hospital follow up.  Dalbert Mayotte, MD

## 2013-07-09 NOTE — Care Management Note (Addendum)
    Page 1 of 2   07/09/2013     1:59:10 PM   CARE MANAGEMENT NOTE 07/09/2013  Patient:  SLAYDE, BRAULT   Account Number:  [id number]  Date Initiated:  07/09/2013  Documentation initiated by:  Fontaine Kossman  Subjective/Objective Assessment:   PT ADM ON 1/24 WITH COPD EXACERBATION.  PTA, PT RESIDES AT [place].  [place].     Action/Plan:   PT FOR DC HOME TODAY WITH DAUGHTER.  PT/OT RECOMMENDING SNF, BUT PT DECLINED PLACEMENT.   WILL NEED TO RESUME HH SERVICES AT DC.  NOTIFIED [place].  START OF CARE 24-48H POST DC DATE.   Anticipated DC Date:  07/09/2013   Anticipated DC Plan:  [place]  CM consult      [facility] Choice  HOME HEALTH  Resumption Of Svcs/PTA Provider   Choice offered to / List presented to:  C-1 Patient        [place] arranged  HH-1 RN  [place] PT      [place].   Status of service:  Completed, signed off Medicare Important Message given?   (If response is "NO", the following Medicare IM given date fields will be blank) Date Medicare IM given:   Date Additional Medicare IM given:    Discharge Disposition:  [place]  Per UR Regulation:  Reviewed for med. necessity/level of care/duration of stay  If discussed at [place] of Stay Meetings, dates discussed:    Comments:

## 2013-07-09 NOTE — Progress Notes (Signed)
Physical Therapy Treatment Patient Details Name: Nathaniel [name] MRN: [id number] DOB: [birth year] Today's Date: 07/09/2013 Time: 0258-5277 PT Time Calculation (min): 25 min  PT Assessment / Plan / Recommendation  History of Present Illness Pt admit for COPD exacerbation.     PT Comments   Through interpreter services, pt expressed his desire to go directly home despite what therapy recommends.  He states he will get his sister to assist.  He states he has access to a rolling walker if needed   Follow Up Recommendations  SNF;Supervision/Assistance - 24 hour;Other (comment)  (Pt prefers to go directly home (discussed with interpreter assist))     Does the patient have the potential to tolerate intense rehabilitation     Barriers to Discharge        Equipment Recommendations  None recommended by PT    Recommendations for Other Services    Frequency Min 3X/week   Progress towards PT Goals Progress towards PT goals: Progressing toward goals  Plan Current plan remains appropriate    Precautions / Restrictions Precautions Precautions: Fall Restrictions Weight Bearing Restrictions: No   Pertinent Vitals/Pain VSS during gait     Mobility  Bed Mobility Overal bed mobility: Independent Transfers Overall transfer level: Needs assistance Equipment used: None Transfers: Sit to/from [facility] Sit to Stand: Min guard;Modified independent (Device/Increase time) Stand pivot transfers: Supervision General transfer comment: Needed guard for safety Ambulation/Gait Ambulation/Gait assistance: Min guard;Supervision Ambulation Distance (Feet): 300 Feet Assistive device: Rolling walker (2 wheeled);None Gait Pattern/deviations: Step-through pattern Gait velocity: slower Gait velocity interpretation: Below normal speed for age/gender General Gait Details: gait characterized by stiff shuffled steps biased back on heels.  He has a wide BOS and moves so quickly it looks as if he's  staggering around, but self corrects without incident.    Exercises     PT Diagnosis:    PT Problem List:   PT Treatment Interventions:     PT Goals (current goals can now be found in the care plan section) Acute Rehab PT Goals Patient Stated Goal: go home PT Goal Formulation: With patient Time For Goal Achievement: 07/15/13 Potential to Achieve Goals: Good  Visit Information  Last PT Received On: 07/09/13 Assistance Needed: +1 History of Present Illness: Pt admit for COPD exacerbation.      Subjective Data  Subjective: Through interpreter, Don't worry, I'll be fine, but I want to go home.  I'll get my sister in to help. Patient Stated Goal: go home   Cognition  Cognition Arousal/Alertness: Awake/alert Behavior During Therapy: WFL for tasks assessed/performed Overall Cognitive Status: Within Functional Limits for tasks assessed    Balance  Balance Overall balance assessment: Needs assistance Sitting-balance support: No upper extremity supported;Feet supported Sitting balance-Leahy Scale: Good Standing balance support: Bilateral upper extremity supported;No upper extremity supported Standing balance-Leahy Scale: Fair Standing balance comment: self corrects without incident, but staggers around appearing unsteady High Level Balance Comments: min assist  End of Session PT - End of Session Equipment Utilized During Treatment: Gait belt Activity Tolerance: Patient tolerated treatment well Patient left: in chair;with call bell/phone within reach Nurse Communication: Mobility status   GP     Sameen Leas, Tessie Fass 07/09/2013, 12:19 PM 07/09/2013  Donnella Sham, [place] [phone]  (pager)

## 2013-07-09 NOTE — Progress Notes (Signed)
Discharge instructions and prescription given to patient and friend; all questions answered.  Patient escorted via wheelchair by Laverda Sorenson, NT to friend's vehicle.

## 2013-07-11 NOTE — Telephone Encounter (Signed)
Nathaniel [name] and stated pt was DCd from hosp Monday and she tried to go see him yesterday. She called several times and when she would tell pt she wanted to come by, he would just say "no, no" or remain silent. Before hosp stay, pt would say OK when she called w/time of visit. [name] went by the home and pt would not come to the door. She had the interpretor service call pt and he would not answer. Dr Marin Comment, are you able to communicate w/pt? [name] wonders if pt is safe to be at home by himself (he was in hosp for dehydration and fall). I suggested that it might be a good idea to have MSW from [facility] make a visit if able to assess situation. Dr Marin Comment, please advise.

## 2013-07-12 ENCOUNTER — Telehealth: Payer: Self-pay | Admitting: Family Medicine

## 2013-07-12 NOTE — Telephone Encounter (Signed)
Dr Marin Comment called me to report that she just spoke w/pt on mobile and he is agreeable to [facility] visits.

## 2013-07-12 NOTE — Telephone Encounter (Signed)
Spoke to patient, he will see home health nurse, if he does not then he will come into our office. He states he  Is eating well. His daughter sees him about every 2-3 days.

## 2013-07-12 NOTE — Telephone Encounter (Addendum)
Nathaniel [name] at [facility] and gave her info from Dr Marin Comment. She will try to reach pt on mobile # now.    I spoke with [name] and patient. Dr Marin Comment

## 2013-07-12 NOTE — Discharge Summary (Signed)
Patient seen and examined by me on the date of discharge, I agree with the resident discharge plan.  Dalbert Mayotte, MD

## 2013-07-16 ENCOUNTER — Other Ambulatory Visit: Payer: Self-pay | Admitting: Family Medicine

## 2013-07-16 MED ORDER — PANTOPRAZOLE SODIUM 40 MG PO TBEC: 40.0000 mg | DELAYED_RELEASE_TABLET | Freq: Every day | ORAL | Status: DC

## 2013-07-18 ENCOUNTER — Emergency Department (HOSPITAL_COMMUNITY)
Admission: EM | Admit: 2013-07-18 | Discharge: 2013-07-18 | Disposition: A | Discharge status: Home / Self Care | Payer: PRIVATE HEALTH INSURANCE | Attending: Emergency Medicine | Admitting: Emergency Medicine

## 2013-07-18 ENCOUNTER — Encounter (HOSPITAL_COMMUNITY): Payer: Self-pay | Admitting: Emergency Medicine

## 2013-07-18 DIAGNOSIS — N400 Enlarged prostate without lower urinary tract symptoms: Secondary | ICD-10-CM | POA: Insufficient documentation

## 2013-07-18 DIAGNOSIS — Z87891 Personal history of nicotine dependence: Secondary | ICD-10-CM | POA: Insufficient documentation

## 2013-07-18 DIAGNOSIS — J449 Chronic obstructive pulmonary disease, unspecified: Secondary | ICD-10-CM | POA: Insufficient documentation

## 2013-07-18 DIAGNOSIS — IMO0002 Reserved for concepts with insufficient information to code with codable children: Secondary | ICD-10-CM | POA: Insufficient documentation

## 2013-07-18 DIAGNOSIS — R739 Hyperglycemia, unspecified: Secondary | ICD-10-CM

## 2013-07-18 DIAGNOSIS — E1142 Type 2 diabetes mellitus with diabetic polyneuropathy: Secondary | ICD-10-CM | POA: Insufficient documentation

## 2013-07-18 DIAGNOSIS — Z792 Long term (current) use of antibiotics: Secondary | ICD-10-CM | POA: Insufficient documentation

## 2013-07-18 DIAGNOSIS — E1149 Type 2 diabetes mellitus with other diabetic neurological complication: Secondary | ICD-10-CM | POA: Insufficient documentation

## 2013-07-18 DIAGNOSIS — Z79899 Other long term (current) drug therapy: Secondary | ICD-10-CM | POA: Insufficient documentation

## 2013-07-18 DIAGNOSIS — I10 Essential (primary) hypertension: Secondary | ICD-10-CM | POA: Insufficient documentation

## 2013-07-18 DIAGNOSIS — E785 Hyperlipidemia, unspecified: Secondary | ICD-10-CM | POA: Insufficient documentation

## 2013-07-18 DIAGNOSIS — J4489 Other specified chronic obstructive pulmonary disease: Secondary | ICD-10-CM | POA: Insufficient documentation

## 2013-07-18 LAB — BASIC METABOLIC PANEL
BUN: 19 mg/dL (ref 6–23)
CO2: 28 mEq/L (ref 19–32)
Calcium: 8.8 mg/dL (ref 8.4–10.5)
Chloride: 100 mEq/L (ref 96–112)
Creatinine, Ser: 0.7 mg/dL (ref 0.50–1.35)
GFR calc Af Amer: >90 mL/min (ref >90)
GFR calc non Af Amer: 89 mL/min — ABNORMAL LOW (ref >90)
GLUCOSE: 375 mg/dL — AB (ref 70–99)
POTASSIUM: 4.2 meq/L (ref 3.7–5.3)
SODIUM: 139 meq/L (ref 137–147)

## 2013-07-18 LAB — CBC WITH DIFFERENTIAL/PLATELET
Basophils Absolute: 0 10*3/uL (ref 0.0–0.1)
Basophils Relative: 0 % (ref 0–1)
EOS ABS: 0.2 10*3/uL (ref 0.0–0.7)
EOS PCT: 2 % (ref 0–5)
HCT: 40.3 % (ref 39.0–52.0)
HEMOGLOBIN: 13.4 g/dL (ref 13.0–17.0)
LYMPHS ABS: 1.1 10*3/uL (ref 0.7–4.0)
Lymphocytes Relative: 11 % — ABNORMAL LOW (ref 12–46)
MCH: 29.3 pg (ref 26.0–34.0)
MCHC: 33.3 g/dL (ref 30.0–36.0)
MCV: 88.2 fL (ref 78.0–100.0)
MONOS PCT: 4 % (ref 3–12)
Monocytes Absolute: 0.5 10*3/uL (ref 0.1–1.0)
Neutro Abs: 8.8 10*3/uL — ABNORMAL HIGH (ref 1.7–7.7)
Neutrophils Relative %: 83 % — ABNORMAL HIGH (ref 43–77)
Platelets: 202 10*3/uL (ref 150–400)
RBC: 4.57 MIL/uL (ref 4.22–5.81)
RDW: 13.6 % (ref 11.5–15.5)
WBC: 10.6 10*3/uL — ABNORMAL HIGH (ref 4.0–10.5)

## 2013-07-18 LAB — URINE MICROSCOPIC-ADD ON

## 2013-07-18 LAB — GLUCOSE, CAPILLARY
Glucose-Capillary: 319 mg/dL — ABNORMAL HIGH (ref 70–99)
Glucose-Capillary: 233 mg/dL — ABNORMAL HIGH (ref 70–99)

## 2013-07-18 LAB — URINALYSIS, ROUTINE W REFLEX MICROSCOPIC
BILIRUBIN URINE: NEGATIVE
Glucose, UA: >1000 mg/dL — AB
Hgb urine dipstick: NEGATIVE
KETONES UR: NEGATIVE mg/dL
LEUKOCYTES UA: NEGATIVE
NITRITE: NEGATIVE
Protein, ur: NEGATIVE mg/dL
Specific Gravity, Urine: 1.04 — ABNORMAL HIGH (ref 1.005–1.030)
UROBILINOGEN UA: 1 mg/dL (ref 0.0–1.0)
pH: 6 (ref 5.0–8.0)

## 2013-07-18 MED ORDER — SODIUM CHLORIDE 0.9 % IV BOLUS (SEPSIS)
1000.0000 mL | Freq: Once | INTRAVENOUS | Status: AC
  Administered 2013-07-18: 1000 mL via INTRAVENOUS

## 2013-07-18 NOTE — ED Notes (Signed)
Called [facility] to get pt. Transferred, [facility] unable to take report. Will try back.

## 2013-07-18 NOTE — Discharge Instructions (Signed)

## 2013-07-18 NOTE — ED Provider Notes (Signed)
CSN: [id number]     Arrival date & time 07/18/13  1524 History   First MD Initiated Contact with Patient 07/18/13 1528     Chief Complaint  Patient presents with  . Hyperglycemia   (Consider location/radiation/quality/duration/timing/severity/associated sxs/prior Treatment) HPI  80 year old man with a history of diabetes, hyperlipidemia, hypertension who presents with hyperglycemia. Patient was brought in by EMS. He speaks [place] and a interpreter was used. Patient states that he takes metformin for diabetes. He is not taking insulin. Noted his blood sugar this morning to be 250 and was told that he probably needed to be seen at the hospital. EMS got a reading of 450. Patient is asymptomatic and denies chest pain, shortness breath, cough, abdominal pain. He denies any recent fevers. Patient does report increased urination.  Past Medical History  Diagnosis Date  . Hyperlipidemia   . Hypertension   . Diabetic neuropathy     /H&P 01/30/2013 (01/30/2013)  . Diabetes mellitus   . BPH (benign prostatic hyperplasia)     /H&P 01/30/2013 (01/30/2013)  . Emphysematous cystitis     /H&P 01/30/2013 (01/30/2013)  . COPD (chronic obstructive pulmonary disease)   . Orthopnoea    Past Surgical History  Procedure Laterality Date  . No past surgeries     No family history on file. History  Substance Use Topics  . Smoking status: Former Smoker -- 0.25 packs/day for 42 years    Types: Cigarettes  . Smokeless tobacco: Never Used     Comment: 01/30/2013 "quit smoking ~ 10 yr ago"  . Alcohol Use: No    Review of Systems  Constitutional: Negative.  Negative for fever.  Respiratory: Negative.  Negative for chest tightness and shortness of breath.   Cardiovascular: Negative.  Negative for chest pain.  Gastrointestinal: Negative.  Negative for nausea, vomiting and abdominal pain.  Endocrine: Positive for polyuria.  Genitourinary: Negative.  Negative for dysuria and urgency.  Musculoskeletal: Negative  for back pain.  Skin: Negative for rash.  Neurological: Negative for headaches.  Psychiatric/Behavioral: Negative for confusion.  All other systems reviewed and are negative.    Allergies  Review of patient's allergies indicates no known allergies.  Home Medications   Current Outpatient Rx  Name  Route  Sig  Dispense  Refill  . albuterol (PROVENTIL) (2.5 MG/3ML) 0.083% nebulizer solution   Nebulization   Take 3 mLs (2.5 mg total) by nebulization every 4 (four) hours as needed for wheezing or shortness of breath.   75 mL   12   . albuterol (VENTOLIN HFA) 108 (90 BASE) MCG/ACT inhaler   Inhalation   Inhale 2 puffs into the lungs every 4 (four) hours while awake.   1 Inhaler   12   . benzonatate (TESSALON) 200 MG capsule   Oral   Take 1 capsule (200 mg total) by mouth 3 (three) times daily as needed for cough.   30 capsule   0   . doxycycline (VIBRAMYCIN) 100 MG capsule   Oral   Take 1 capsule (100 mg total) by mouth 2 (two) times daily.   10 capsule   0     Please take for 5 more days two times daily.   . finasteride (PROSCAR) 5 MG tablet   Oral   Take 1 tablet (5 mg total) by mouth daily.   30 tablet   0   . Fluticasone-Salmeterol (ADVAIR) 500-50 MCG/DOSE AEPB   Inhalation   Inhale 1 puff into the lungs 2 (two) times daily.  60 each   11   . gabapentin (NEURONTIN) 100 MG capsule   Oral   Take 200 mg by mouth 3 (three) times daily.         . metFORMIN (GLUCOPHAGE) 500 MG tablet   Oral   Take 1 tablet (500 mg total) by mouth 2 (two) times daily with a meal. 1 tablet by mouth twice daily for diabetes   60 tablet   5   . montelukast (SINGULAIR) 10 MG tablet   Oral   Take 1 tablet (10 mg total) by mouth at bedtime.   30 tablet   0   . pantoprazole (PROTONIX) 40 MG tablet   Oral   Take 1 tablet (40 mg total) by mouth daily.   30 tablet   5   . polyethylene glycol powder (GLYCOLAX/MIRALAX) powder   Oral   Take 17 g by mouth 2 (two) times daily  as needed.   3350 g   1   . predniSONE (DELTASONE) 50 MG tablet      Please take 50 mg daily for three more days with breakfast.  Please take 40 mg daily for 5 days at breakfast  Please take 30 mg daily for 5 days at breakfast  Please take 20 mg daily for 5 days at breakfast  Please take 10 mg daily for 5 days at breakfast   65 tablet   0   . simvastatin (ZOCOR) 20 MG tablet   Oral   Take 1 tablet (20 mg total) by mouth daily at 6 PM.   30 tablet   0   . tamsulosin (FLOMAX) 0.4 MG CAPS capsule   Oral   Take 2 capsules (0.8 mg total) by mouth daily after supper.   30 capsule   0   . tiotropium (SPIRIVA) 18 MCG inhalation capsule   Inhalation   Place 1 capsule (18 mcg total) into inhaler and inhale daily.   30 capsule   12    BP 178/89  Pulse 75  Temp(Src) 98 F (36.7 C) (Oral)  Resp 18  SpO2 97% Physical Exam  Nursing note and vitals reviewed. Constitutional: He is oriented to person, place, and time. No distress.  Pleasant, elderly  HENT:  Head: Normocephalic and atraumatic.  Mouth/Throat: Oropharynx is clear and moist.  Eyes: Pupils are equal, round, and reactive to light.  Neck: Neck supple.  Cardiovascular: Normal rate, regular rhythm and normal heart sounds.   No murmur heard. Pulmonary/Chest: Effort normal and breath sounds normal. No respiratory distress. He has no wheezes.  Abdominal: Soft. There is no tenderness.  Musculoskeletal: He exhibits no edema.  Lymphadenopathy:    He has no cervical adenopathy.  Neurological: He is alert and oriented to person, place, and time.  Skin: Skin is warm and dry.  Psychiatric: He has a normal mood and affect.    ED Course  Procedures (including critical care time) Labs Review Labs Reviewed  BASIC METABOLIC PANEL - Abnormal; Notable for the following:    Glucose, Bld 375 (*)    GFR calc non Af Amer 89 (*)    All other components within normal limits  CBC WITH DIFFERENTIAL - Abnormal; Notable for the  following:    WBC 10.6 (*)    Neutrophils Relative % 83 (*)    Neutro Abs 8.8 (*)    Lymphocytes Relative 11 (*)    All other components within normal limits  URINALYSIS, ROUTINE W REFLEX MICROSCOPIC - Abnormal; Notable for the following:  Specific Gravity, Urine 1.040 (*)    Glucose, UA >1000 (*)    All other components within normal limits  GLUCOSE, CAPILLARY - Abnormal; Notable for the following:    Glucose-Capillary 319 (*)    All other components within normal limits  GLUCOSE, CAPILLARY - Abnormal; Notable for the following:    Glucose-Capillary 233 (*)    All other components within normal limits  URINE MICROSCOPIC-ADD ON   Imaging Review No results found.  EKG Interpretation    Date/Time:  Wednesday July 18 2013 16:15:09 EST Ventricular Rate:  74 PR Interval:  154 QRS Duration: 92 QT Interval:  400 QTC Calculation: 444 R Axis:   -82 Text Interpretation:  Sinus rhythm Left anterior fascicular block ST elevation, consider inferior injury No significant change since last tracing Confirmed by Kalem Rockwell  MD, Esmeralda Blanford ([id number]) on 07/18/2013 4:20:59 PM            MDM   1. Hyperglycemia    Patient presents with hyperglycemia. He has no other complaints at this time. Initial CBG 319. No evidence of On BMP. No ketones in the urine. Patient was given a normal saline bolus. Repeat CBG is 233.  Patient has been hyperglycemic during prior evaluations.  Discussed the patient that he needs followup with his primary physician for further glucose management. You need an additional by mouth medication or insulin. Patient stated understanding. He was given strict return precautions.  After history, exam, and medical workup I feel the patient has been appropriately medically screened and is safe for discharge home. Pertinent diagnoses were discussed with the patient. Patient was given return precautions.     Merryl Hacker, MD 07/18/13 [phone]

## 2013-07-18 NOTE — ED Notes (Signed)
Patient presents via EMS with a chief complaint of hyperglycemia. Patient speaks [place] and very little english. EMS reports his glucometer was reading "high", EMS got a reading of 450. Patient alert.

## 2013-07-18 NOTE — ED Notes (Signed)
Use dinterpreter phone to talk with pt. Pt sts EMS told him that he would have a ride home. Explained that he could use a bus pass, take taxi, or call friend. Pt unable to reach friend. Notified charge, Charge doesn't want to put non-english speaking pt. On bus. Pt can't afford cab. Notified social work to get Chief Strategy Officer. Was referred to [facility]. Will follow up about voucher.

## 2013-07-18 NOTE — ED Notes (Signed)
Pt able to get a hold of friend, pt leaving now.

## 2013-07-20 ENCOUNTER — Telehealth: Payer: Self-pay | Admitting: Family Medicine

## 2013-07-20 ENCOUNTER — Ambulatory Visit: Payer: Self-pay | Admitting: Family Medicine

## 2013-07-20 NOTE — Telephone Encounter (Signed)
Called pt since he did not show up in clinic. No answer Spoke with daughter, dad was a no show today . She will tell him to come on tomorrow when I am working on the urgent care side.  He was recently in ED for hyperglycemia, he was prednisone for COPD exacerbation.  He needs follow-up. I would prefer her to come with him but it sounds like she is always working.

## 2013-07-26 ENCOUNTER — Telehealth: Payer: Self-pay

## 2013-07-26 NOTE — Telephone Encounter (Signed)
Dr. Marin Comment - Allyson Sabal from [place] called to see if we could give her information about the patient Nathaniel [name]), which I declined, she then stated that she could not find the patient and that she would have to discontinue care if she could not locate him.  Her best number is [phone].  She said she wasn't able to locate him for either visit this week.

## 2013-08-02 NOTE — Telephone Encounter (Signed)
Dr Marin Comment, it doesn't look like the orig message below was ever routed to you. Gerald Stabs called back again and stated that she still can not get pt to answer the door or phone. They are getting ready to discharge pt anyway soon and wondered if you want her to go ahead and discharge him or if you want to try to get in touch with pt again?

## 2013-08-03 NOTE — Telephone Encounter (Signed)
I was unable to reach the patient by cell or home phone numbers listed. Called Daughter and LM  Regarding his current status and also need for follow-up. Spoke with Gerald Stabs from [facility], apparently she has not seen him in over a week since no one would answer when she ent to the home. She stopped in yesterday and the patient opened the door and told him her that he does not want anymore home nursing visits, she got this through the vietnamese interpretation line. She stated he was wheezing but without chest pain or pain anywhere.

## 2013-09-13 ENCOUNTER — Emergency Department (HOSPITAL_COMMUNITY): Payer: PRIVATE HEALTH INSURANCE

## 2013-09-13 ENCOUNTER — Encounter (HOSPITAL_COMMUNITY): Payer: Self-pay | Admitting: Emergency Medicine

## 2013-09-13 ENCOUNTER — Inpatient Hospital Stay (HOSPITAL_COMMUNITY)
Admission: EM | Admit: 2013-09-13 | Discharge: 2013-09-16 | DRG: 190 | Disposition: A | Discharge status: Home / Self Care | Payer: PRIVATE HEALTH INSURANCE | Attending: Internal Medicine | Admitting: Internal Medicine

## 2013-09-13 DIAGNOSIS — E1142 Type 2 diabetes mellitus with diabetic polyneuropathy: Secondary | ICD-10-CM | POA: Diagnosis present

## 2013-09-13 DIAGNOSIS — I498 Other specified cardiac arrhythmias: Secondary | ICD-10-CM | POA: Diagnosis present

## 2013-09-13 DIAGNOSIS — Z79899 Other long term (current) drug therapy: Secondary | ICD-10-CM

## 2013-09-13 DIAGNOSIS — N3080 Other cystitis without hematuria: Secondary | ICD-10-CM

## 2013-09-13 DIAGNOSIS — J984 Other disorders of lung: Secondary | ICD-10-CM

## 2013-09-13 DIAGNOSIS — R071 Chest pain on breathing: Secondary | ICD-10-CM | POA: Diagnosis present

## 2013-09-13 DIAGNOSIS — J9600 Acute respiratory failure, unspecified whether with hypoxia or hypercapnia: Secondary | ICD-10-CM | POA: Diagnosis present

## 2013-09-13 DIAGNOSIS — I10 Essential (primary) hypertension: Secondary | ICD-10-CM | POA: Diagnosis present

## 2013-09-13 DIAGNOSIS — D72829 Elevated white blood cell count, unspecified: Secondary | ICD-10-CM | POA: Diagnosis not present

## 2013-09-13 DIAGNOSIS — J449 Chronic obstructive pulmonary disease, unspecified: Secondary | ICD-10-CM

## 2013-09-13 DIAGNOSIS — E785 Hyperlipidemia, unspecified: Secondary | ICD-10-CM | POA: Diagnosis present

## 2013-09-13 DIAGNOSIS — Z791 Long term (current) use of non-steroidal anti-inflammatories (NSAID): Secondary | ICD-10-CM

## 2013-09-13 DIAGNOSIS — Z681 Body mass index (BMI) 19 or less, adult: Secondary | ICD-10-CM

## 2013-09-13 DIAGNOSIS — E119 Type 2 diabetes mellitus without complications: Secondary | ICD-10-CM | POA: Diagnosis present

## 2013-09-13 DIAGNOSIS — J9691 Respiratory failure, unspecified with hypoxia: Secondary | ICD-10-CM

## 2013-09-13 DIAGNOSIS — N400 Enlarged prostate without lower urinary tract symptoms: Secondary | ICD-10-CM | POA: Diagnosis present

## 2013-09-13 DIAGNOSIS — R0789 Other chest pain: Secondary | ICD-10-CM | POA: Diagnosis present

## 2013-09-13 DIAGNOSIS — R9431 Abnormal electrocardiogram [ECG] [EKG]: Secondary | ICD-10-CM | POA: Diagnosis present

## 2013-09-13 DIAGNOSIS — R636 Underweight: Secondary | ICD-10-CM | POA: Diagnosis present

## 2013-09-13 DIAGNOSIS — T380X5A Adverse effect of glucocorticoids and synthetic analogues, initial encounter: Secondary | ICD-10-CM | POA: Diagnosis present

## 2013-09-13 DIAGNOSIS — I452 Bifascicular block: Secondary | ICD-10-CM | POA: Diagnosis present

## 2013-09-13 DIAGNOSIS — E0590 Thyrotoxicosis, unspecified without thyrotoxic crisis or storm: Secondary | ICD-10-CM

## 2013-09-13 DIAGNOSIS — R000 Tachycardia, unspecified: Secondary | ICD-10-CM | POA: Diagnosis present

## 2013-09-13 DIAGNOSIS — K219 Gastro-esophageal reflux disease without esophagitis: Secondary | ICD-10-CM | POA: Diagnosis present

## 2013-09-13 DIAGNOSIS — E1149 Type 2 diabetes mellitus with other diabetic neurological complication: Secondary | ICD-10-CM | POA: Diagnosis present

## 2013-09-13 DIAGNOSIS — G4700 Insomnia, unspecified: Secondary | ICD-10-CM

## 2013-09-13 DIAGNOSIS — G629 Polyneuropathy, unspecified: Secondary | ICD-10-CM

## 2013-09-13 DIAGNOSIS — J441 Chronic obstructive pulmonary disease with (acute) exacerbation: Principal | ICD-10-CM | POA: Diagnosis present

## 2013-09-13 DIAGNOSIS — Z87891 Personal history of nicotine dependence: Secondary | ICD-10-CM

## 2013-09-13 DIAGNOSIS — R091 Pleurisy: Secondary | ICD-10-CM | POA: Diagnosis present

## 2013-09-13 LAB — BASIC METABOLIC PANEL
BUN: 22 mg/dL (ref 6–23)
CALCIUM: 9.3 mg/dL (ref 8.4–10.5)
CHLORIDE: 104 meq/L (ref 96–112)
CO2: 28 mEq/L (ref 19–32)
Creatinine, Ser: 0.8 mg/dL (ref 0.50–1.35)
GFR calc Af Amer: >90 mL/min (ref >90)
GFR calc non Af Amer: 83 mL/min — ABNORMAL LOW (ref >90)
GLUCOSE: 186 mg/dL — AB (ref 70–99)
POTASSIUM: 4 meq/L (ref 3.7–5.3)
SODIUM: 145 meq/L (ref 137–147)

## 2013-09-13 LAB — CBC WITH DIFFERENTIAL/PLATELET
Basophils Absolute: 0 10*3/uL (ref 0.0–0.1)
Basophils Relative: 0 % (ref 0–1)
Eosinophils Absolute: 0.9 10*3/uL — ABNORMAL HIGH (ref 0.0–0.7)
Eosinophils Relative: 10 % — ABNORMAL HIGH (ref 0–5)
HEMATOCRIT: 41.6 % (ref 39.0–52.0)
HEMOGLOBIN: 13.4 g/dL (ref 13.0–17.0)
LYMPHS PCT: 22 % (ref 12–46)
Lymphs Abs: 2 10*3/uL (ref 0.7–4.0)
MCH: 29.1 pg (ref 26.0–34.0)
MCHC: 32.2 g/dL (ref 30.0–36.0)
MCV: 90.4 fL (ref 78.0–100.0)
MONO ABS: 0.4 10*3/uL (ref 0.1–1.0)
MONOS PCT: 4 % (ref 3–12)
NEUTROS PCT: 64 % (ref 43–77)
Neutro Abs: 5.8 10*3/uL (ref 1.7–7.7)
Platelets: 215 10*3/uL (ref 150–400)
RBC: 4.6 MIL/uL (ref 4.22–5.81)
RDW: 14.7 % (ref 11.5–15.5)
WBC: 9.1 10*3/uL (ref 4.0–10.5)

## 2013-09-13 LAB — I-STAT TROPONIN, ED: TROPONIN I, POC: 0 ng/mL (ref 0.00–0.08)

## 2013-09-13 LAB — PRO B NATRIURETIC PEPTIDE: PRO B NATRI PEPTIDE: 60.7 pg/mL (ref 0–450)

## 2013-09-13 MED ORDER — ALBUTEROL SULFATE (2.5 MG/3ML) 0.083% IN NEBU
5.0000 mg | INHALATION_SOLUTION | Freq: Once | RESPIRATORY_TRACT | Status: DC
  Filled 2013-09-13: qty 6

## 2013-09-13 MED ORDER — IPRATROPIUM BROMIDE 0.02 % IN SOLN
0.5000 mg | Freq: Once | RESPIRATORY_TRACT | Status: AC
  Administered 2013-09-13: 0.5 mg via RESPIRATORY_TRACT
  Filled 2013-09-13: qty 2.5

## 2013-09-13 MED ORDER — METHYLPREDNISOLONE SODIUM SUCC 125 MG IJ SOLR
125.0000 mg | Freq: Once | INTRAMUSCULAR | Status: AC
  Administered 2013-09-13: 125 mg via INTRAVENOUS
  Filled 2013-09-13: qty 2

## 2013-09-13 MED ORDER — ALBUTEROL (5 MG/ML) CONTINUOUS INHALATION SOLN
10.0000 mg/h | INHALATION_SOLUTION | Freq: Once | RESPIRATORY_TRACT | Status: AC
  Administered 2013-09-13: 10 mg/h via RESPIRATORY_TRACT
  Filled 2013-09-13: qty 20

## 2013-09-13 NOTE — ED Notes (Signed)
Bed: VH84 Expected date:  Expected time:  Means of arrival:  Comments: EMS 80yo M, Resp Distress, wheezing to all fields, IV

## 2013-09-13 NOTE — ED Notes (Signed)
Per EMS pt daughter called 911 stated father in resp distress. Upon arrival EMS noted pt was tripoding with audible wheezing. Pt states that his home treatments are no longer working. Pt able to understand English but has difficulty responding (speaks [place].). Pt given mg of albuterol, .5 Atrovent, 125 mg solumedrol in route.

## 2013-09-13 NOTE — ED Provider Notes (Signed)
MSE was initiated and I personally evaluated the patient and placed orders (if any) at  10:55 PM on September 13, 2013.  Pt presents with wheezing, shortness of breath.  Has history of COPD.    On exam, able to speak in few word sentences.  Retraction and wheezing noted on exam bilaterally.  Will start nebulizer treatment, steroids, labs xrays.    Kathalene Frames, MD 09/13/13 [phone]

## 2013-09-14 ENCOUNTER — Encounter (HOSPITAL_COMMUNITY): Payer: Self-pay | Admitting: Internal Medicine

## 2013-09-14 DIAGNOSIS — R000 Tachycardia, unspecified: Secondary | ICD-10-CM | POA: Diagnosis present

## 2013-09-14 DIAGNOSIS — J441 Chronic obstructive pulmonary disease with (acute) exacerbation: Principal | ICD-10-CM

## 2013-09-14 DIAGNOSIS — E119 Type 2 diabetes mellitus without complications: Secondary | ICD-10-CM

## 2013-09-14 DIAGNOSIS — I10 Essential (primary) hypertension: Secondary | ICD-10-CM | POA: Diagnosis present

## 2013-09-14 DIAGNOSIS — J9600 Acute respiratory failure, unspecified whether with hypoxia or hypercapnia: Secondary | ICD-10-CM

## 2013-09-14 DIAGNOSIS — R9431 Abnormal electrocardiogram [ECG] [EKG]: Secondary | ICD-10-CM | POA: Diagnosis present

## 2013-09-14 DIAGNOSIS — J9691 Respiratory failure, unspecified with hypoxia: Secondary | ICD-10-CM | POA: Diagnosis present

## 2013-09-14 DIAGNOSIS — R0789 Other chest pain: Secondary | ICD-10-CM | POA: Diagnosis present

## 2013-09-14 LAB — CBC
HCT: 39.2 % (ref 39.0–52.0)
Hemoglobin: 12.3 g/dL — ABNORMAL LOW (ref 13.0–17.0)
MCH: 28.3 pg (ref 26.0–34.0)
MCHC: 31.4 g/dL (ref 30.0–36.0)
MCV: 90.1 fL (ref 78.0–100.0)
PLATELETS: 191 10*3/uL (ref 150–400)
RBC: 4.35 MIL/uL (ref 4.22–5.81)
RDW: 14.6 % (ref 11.5–15.5)
WBC: 10.6 10*3/uL — ABNORMAL HIGH (ref 4.0–10.5)

## 2013-09-14 LAB — GLUCOSE, CAPILLARY
GLUCOSE-CAPILLARY: 301 mg/dL — AB (ref 70–99)
GLUCOSE-CAPILLARY: 63 mg/dL — AB (ref 70–99)
GLUCOSE-CAPILLARY: 272 mg/dL — AB (ref 70–99)
Glucose-Capillary: 273 mg/dL — ABNORMAL HIGH (ref 70–99)
Glucose-Capillary: 241 mg/dL — ABNORMAL HIGH (ref 70–99)
Glucose-Capillary: 98 mg/dL (ref 70–99)

## 2013-09-14 LAB — COMPREHENSIVE METABOLIC PANEL
ALBUMIN: 3.8 g/dL (ref 3.5–5.2)
ALK PHOS: 85 U/L (ref 39–117)
ALT: 12 U/L (ref 0–53)
AST: 14 U/L (ref 0–37)
BUN: 26 mg/dL — ABNORMAL HIGH (ref 6–23)
CHLORIDE: 106 meq/L (ref 96–112)
CO2: 21 mEq/L (ref 19–32)
Calcium: 9 mg/dL (ref 8.4–10.5)
Creatinine, Ser: 0.65 mg/dL (ref 0.50–1.35)
GFR calc Af Amer: >90 mL/min (ref >90)
GFR calc non Af Amer: >90 mL/min (ref >90)
Glucose, Bld: 286 mg/dL — ABNORMAL HIGH (ref 70–99)
POTASSIUM: 3.4 meq/L — AB (ref 3.7–5.3)
SODIUM: 145 meq/L (ref 137–147)
Total Protein: 7.2 g/dL (ref 6.0–8.3)

## 2013-09-14 LAB — TROPONIN I: Troponin I: <0.3 ng/mL (ref <0.30)

## 2013-09-14 LAB — MRSA PCR SCREENING: MRSA BY PCR: NEGATIVE

## 2013-09-14 LAB — BLOOD GAS, ARTERIAL
ACID-BASE DEFICIT: 1.4 mmol/L (ref 0.0–2.0)
Bicarbonate: 23.2 mEq/L (ref 20.0–24.0)
DRAWN BY: 318141
FIO2: 0.21 %
O2 Saturation: 93.5 %
PCO2 ART: 40.1 mmHg (ref 35.0–45.0)
PH ART: 7.378 (ref 7.350–7.450)
PO2 ART: 67.2 mmHg — AB (ref 80.0–100.0)
Patient temperature: 97.8
TCO2: 20.9 mmol/L (ref 0–100)

## 2013-09-14 LAB — HEMOGLOBIN A1C
Hgb A1c MFr Bld: 9.7 % — ABNORMAL HIGH (ref <5.7)
Mean Plasma Glucose: 232 mg/dL — ABNORMAL HIGH (ref <117)

## 2013-09-14 LAB — TSH: TSH: 0.473 u[IU]/mL (ref 0.350–4.500)

## 2013-09-14 MED ORDER — ENSURE COMPLETE PO LIQD
237.0000 mL | Freq: Two times a day (BID) | ORAL | Status: DC
  Administered 2013-09-14 – 2013-09-16 (×4): 237 mL via ORAL

## 2013-09-14 MED ORDER — METFORMIN HCL 500 MG PO TABS
500.0000 mg | ORAL_TABLET | Freq: Two times a day (BID) | ORAL | Status: DC
  Filled 2013-09-14 (×3): qty 1

## 2013-09-14 MED ORDER — PANTOPRAZOLE SODIUM 40 MG PO TBEC
40.0000 mg | DELAYED_RELEASE_TABLET | Freq: Every day | ORAL | Status: DC
  Administered 2013-09-14 – 2013-09-16 (×3): 40 mg via ORAL
  Filled 2013-09-14 (×4): qty 1

## 2013-09-14 MED ORDER — ENOXAPARIN SODIUM 40 MG/0.4ML ~~LOC~~ SOLN
40.0000 mg | SUBCUTANEOUS | Status: DC
Start: 2013-09-14 — End: 2013-09-16
  Administered 2013-09-14 – 2013-09-16 (×3): 40 mg via SUBCUTANEOUS
  Filled 2013-09-14 (×3): qty 0.4

## 2013-09-14 MED ORDER — ONDANSETRON HCL 4 MG/2ML IJ SOLN: 4.0000 mg | Freq: Four times a day (QID) | INTRAMUSCULAR | Status: DC | PRN

## 2013-09-14 MED ORDER — LEVOFLOXACIN IN D5W 500 MG/100ML IV SOLN
500.0000 mg | INTRAVENOUS | Status: DC
  Administered 2013-09-14 – 2013-09-15 (×3): 500 mg via INTRAVENOUS
  Filled 2013-09-14 (×4): qty 100

## 2013-09-14 MED ORDER — IPRATROPIUM BROMIDE 0.02 % IN SOLN
0.5000 mg | RESPIRATORY_TRACT | Status: DC
  Administered 2013-09-14 – 2013-09-15 (×11): 0.5 mg via RESPIRATORY_TRACT
  Filled 2013-09-14 (×11): qty 2.5

## 2013-09-14 MED ORDER — MOMETASONE FURO-FORMOTEROL FUM 200-5 MCG/ACT IN AERO
2.0000 | INHALATION_SPRAY | Freq: Two times a day (BID) | RESPIRATORY_TRACT | Status: DC
  Administered 2013-09-14 – 2013-09-16 (×5): 2 via RESPIRATORY_TRACT
  Filled 2013-09-14: qty 8.8

## 2013-09-14 MED ORDER — DOCUSATE SODIUM 100 MG PO CAPS
100.0000 mg | ORAL_CAPSULE | Freq: Two times a day (BID) | ORAL | Status: DC
  Administered 2013-09-14 – 2013-09-16 (×5): 100 mg via ORAL
  Filled 2013-09-14 (×6): qty 1

## 2013-09-14 MED ORDER — POTASSIUM CHLORIDE IN NACL 20-0.9 MEQ/L-% IV SOLN
INTRAVENOUS | Status: DC
  Administered 2013-09-14 – 2013-09-16 (×4): via INTRAVENOUS
  Filled 2013-09-14 (×5): qty 1000

## 2013-09-14 MED ORDER — ACETAMINOPHEN 650 MG RE SUPP: 650.0000 mg | Freq: Four times a day (QID) | RECTAL | Status: DC | PRN

## 2013-09-14 MED ORDER — CLONIDINE HCL 0.1 MG/24HR TD PTWK
0.1000 mg | MEDICATED_PATCH | TRANSDERMAL | Status: DC
  Administered 2013-09-14: 0.1 mg via TRANSDERMAL
  Filled 2013-09-14: qty 1

## 2013-09-14 MED ORDER — INSULIN ASPART 100 UNIT/ML ~~LOC~~ SOLN
0.0000 [IU] | Freq: Three times a day (TID) | SUBCUTANEOUS | Status: DC
  Administered 2013-09-14: 7 [IU] via SUBCUTANEOUS
  Administered 2013-09-14: 15 [IU] via SUBCUTANEOUS
  Administered 2013-09-15: 11 [IU] via SUBCUTANEOUS
  Administered 2013-09-15: 15 [IU] via SUBCUTANEOUS
  Administered 2013-09-15: 7 [IU] via SUBCUTANEOUS
  Administered 2013-09-16: 11 [IU] via SUBCUTANEOUS

## 2013-09-14 MED ORDER — METHYLPREDNISOLONE SODIUM SUCC 125 MG IJ SOLR
80.0000 mg | Freq: Four times a day (QID) | INTRAMUSCULAR | Status: DC
  Administered 2013-09-14 – 2013-09-15 (×5): 80 mg via INTRAVENOUS
  Filled 2013-09-14 (×9): qty 1.28

## 2013-09-14 MED ORDER — INSULIN DETEMIR 100 UNIT/ML ~~LOC~~ SOLN
10.0000 [IU] | Freq: Every day | SUBCUTANEOUS | Status: DC
  Administered 2013-09-14 – 2013-09-15 (×3): 10 [IU] via SUBCUTANEOUS
  Filled 2013-09-14 (×5): qty 0.1

## 2013-09-14 MED ORDER — TAMSULOSIN HCL 0.4 MG PO CAPS
0.8000 mg | ORAL_CAPSULE | Freq: Every day | ORAL | Status: DC
  Administered 2013-09-14 – 2013-09-15 (×2): 0.8 mg via ORAL
  Filled 2013-09-14 (×3): qty 2

## 2013-09-14 MED ORDER — FINASTERIDE 5 MG PO TABS
5.0000 mg | ORAL_TABLET | Freq: Every day | ORAL | Status: DC
  Administered 2013-09-14 – 2013-09-16 (×3): 5 mg via ORAL
  Filled 2013-09-14 (×3): qty 1

## 2013-09-14 MED ORDER — POLYETHYLENE GLYCOL 3350 17 G PO PACK
17.0000 g | PACK | Freq: Two times a day (BID) | ORAL | Status: DC | PRN
  Filled 2013-09-14: qty 1

## 2013-09-14 MED ORDER — HYDROCODONE-ACETAMINOPHEN 5-325 MG PO TABS
1.0000 | ORAL_TABLET | ORAL | Status: DC | PRN
  Administered 2013-09-14: 1 via ORAL
  Filled 2013-09-14: qty 1

## 2013-09-14 MED ORDER — INSULIN ASPART 100 UNIT/ML ~~LOC~~ SOLN
0.0000 [IU] | Freq: Every day | SUBCUTANEOUS | Status: DC
  Administered 2013-09-14 (×2): 3 [IU] via SUBCUTANEOUS

## 2013-09-14 MED ORDER — SENNA 8.6 MG PO TABS
1.0000 | ORAL_TABLET | Freq: Every day | ORAL | Status: DC
  Administered 2013-09-14 – 2013-09-15 (×2): 8.6 mg via ORAL
  Filled 2013-09-14 (×2): qty 1

## 2013-09-14 MED ORDER — HYDRALAZINE HCL 20 MG/ML IJ SOLN
10.0000 mg | Freq: Once | INTRAMUSCULAR | Status: AC
  Administered 2013-09-14: 10 mg via INTRAVENOUS
  Filled 2013-09-14: qty 1

## 2013-09-14 MED ORDER — ONDANSETRON HCL 4 MG PO TABS: 4.0000 mg | ORAL_TABLET | Freq: Four times a day (QID) | ORAL | Status: DC | PRN

## 2013-09-14 MED ORDER — MONTELUKAST SODIUM 10 MG PO TABS
10.0000 mg | ORAL_TABLET | Freq: Every day | ORAL | Status: DC
  Administered 2013-09-14 – 2013-09-15 (×2): 10 mg via ORAL
  Filled 2013-09-14 (×3): qty 1

## 2013-09-14 MED ORDER — HYDRALAZINE HCL 20 MG/ML IJ SOLN
5.0000 mg | INTRAMUSCULAR | Status: DC | PRN
  Filled 2013-09-14: qty 0.25

## 2013-09-14 MED ORDER — SIMVASTATIN 20 MG PO TABS
20.0000 mg | ORAL_TABLET | Freq: Every day | ORAL | Status: DC
  Administered 2013-09-14 – 2013-09-15 (×2): 20 mg via ORAL
  Filled 2013-09-14 (×3): qty 1

## 2013-09-14 MED ORDER — ALBUTEROL SULFATE (2.5 MG/3ML) 0.083% IN NEBU: 2.5000 mg | INHALATION_SOLUTION | RESPIRATORY_TRACT | Status: DC | PRN

## 2013-09-14 MED ORDER — ACETAMINOPHEN 325 MG PO TABS: 650.0000 mg | ORAL_TABLET | Freq: Four times a day (QID) | ORAL | Status: DC | PRN

## 2013-09-14 MED ORDER — ALUM & MAG HYDROXIDE-SIMETH 200-200-20 MG/5ML PO SUSP
30.0000 mL | Freq: Four times a day (QID) | ORAL | Status: DC | PRN
  Administered 2013-09-15: 30 mL via ORAL

## 2013-09-14 MED ORDER — GUAIFENESIN ER 600 MG PO TB12
600.0000 mg | ORAL_TABLET | Freq: Two times a day (BID) | ORAL | Status: DC
  Administered 2013-09-14 – 2013-09-16 (×6): 600 mg via ORAL
  Filled 2013-09-14 (×7): qty 1

## 2013-09-14 MED ORDER — LEVALBUTEROL HCL 0.63 MG/3ML IN NEBU
0.6300 mg | INHALATION_SOLUTION | RESPIRATORY_TRACT | Status: DC
  Administered 2013-09-14 – 2013-09-15 (×11): 0.63 mg via RESPIRATORY_TRACT
  Filled 2013-09-14 (×18): qty 3

## 2013-09-14 MED ORDER — HYDRALAZINE HCL 20 MG/ML IJ SOLN
5.0000 mg | Freq: Once | INTRAMUSCULAR | Status: AC
  Administered 2013-09-14: 5 mg via INTRAVENOUS
  Filled 2013-09-14: qty 0.25

## 2013-09-14 MED ORDER — GABAPENTIN 100 MG PO CAPS
200.0000 mg | ORAL_CAPSULE | Freq: Two times a day (BID) | ORAL | Status: DC
  Administered 2013-09-14 – 2013-09-16 (×5): 200 mg via ORAL
  Filled 2013-09-14 (×6): qty 2

## 2013-09-14 MED ORDER — BENZONATATE 100 MG PO CAPS
200.0000 mg | ORAL_CAPSULE | Freq: Three times a day (TID) | ORAL | Status: DC
  Administered 2013-09-14 – 2013-09-16 (×7): 200 mg via ORAL
  Filled 2013-09-14 (×9): qty 2

## 2013-09-14 MED ORDER — MORPHINE SULFATE 2 MG/ML IJ SOLN
2.0000 mg | INTRAMUSCULAR | Status: DC | PRN
  Administered 2013-09-14: 2 mg via INTRAVENOUS
  Filled 2013-09-14: qty 1

## 2013-09-14 NOTE — Progress Notes (Signed)
Patient seen and evaluated earlier this AM by my associate. Please refer to H and P for details regarding Assessment and Plan.  I will discontinue metformin while patient is in house.  Place on diabetic diet.  Pt inquiring about discharge and reports that his breathing is improved.  Will reassess next am.  Velvet Bathe

## 2013-09-14 NOTE — ED Notes (Signed)
RT notified ab blood draw.

## 2013-09-14 NOTE — H&P (Addendum)
[facility] History and Physical  Nathaniel [name] LOV:[id number] DOB: September 25, 1933 DOA: 09/13/2013  Referring physician: ED MD Dr. Marnette [name] PCP: [name], Nathaniel PHUONG, DO ([facility])  Chief Complaint: Shortness of breath and cough  HPI: Nathaniel [name] is a 80 y.o. male with a history of COPD, diabetes mellitus, and BPH, who presents to the ED with shortness of breath. The patient is of [place] heritage and he speaks very little [place] but he seems to understand more [place]. Nevertheless, the history is being provided by the patient via an interpreter. Accordingly, over the past 2-3 days, he has had a nonproductive cough, shortness of breath, and wheezing. He has left-sided chest pain and left flank pain primarily when he coughs. He endorses some left-sided pleurisy as well. He has had subjective fever and chills. He denies headache, nausea, vomiting, diaphoresis, or radiation of the chest wall pain to his left arm or his left face. He stopped smoking about 10-11 years ago. He is not on home oxygen.  In the ED, he is afebrile, tachycardic, and hypertensive. His chest x-ray reveals no acute cardiopulmonary abnormalities. His EKG reveals sinus tachycardia with a heart rate of 120 beats per minute, right bundle branch block, left anterior fascicular block, and nonspecific ST and T wave abnormalities. His ABG on room air (after one hour nebulizer) reveals a pH of 7.37, PCO2 of 40, and PO2 of 67. His glucose is 186. He is being admitted for further evaluation and management.     Review of Systems:  As above in history present illness, otherwise negative.  Past Medical History  Diagnosis Date  . Hyperlipidemia   . Hypertension   . Diabetic neuropathy     /H&P 01/30/2013 (01/30/2013)  . Diabetes mellitus   . BPH (benign prostatic hyperplasia)     /H&P 01/30/2013 (01/30/2013)  . Emphysematous cystitis     /H&P 01/30/2013 (01/30/2013)  . COPD (chronic obstructive pulmonary disease)   . Orthopnoea     Past Surgical History  Procedure Laterality Date  . No past surgeries     Social History: He is single. He has one daughter. He lives alone in [place]. He has quit smoking. His smoking use included Cigarettes. He has a 10.5 pack-year smoking history. He has never used smokeless tobacco. He reports that he does not drink alcohol or use illicit drugs.  No Known Allergies  Family history: His parents are deceased, etiology of their deaths are unknown.   Prior to Admission medications   Medication Sig Start Date End Date Taking? Authorizing Provider  albuterol (PROVENTIL) (2.5 MG/3ML) 0.083% nebulizer solution Take 3 mLs (2.5 mg total) by nebulization every 4 (four) hours as needed for wheezing or shortness of breath. 06/20/13   Nathaniel Lis, MD  albuterol (VENTOLIN HFA) 108 (90 BASE) MCG/ACT inhaler Inhale 2 puffs into the lungs every 4 (four) hours while awake. 02/09/13   Nathaniel Carwin, MD  benzonatate (TESSALON) 200 MG capsule Take 1 capsule (200 mg total) by mouth 3 (three) times daily as needed for cough. 07/09/13   Nathaniel Diss, DO  doxycycline (VIBRAMYCIN) 100 MG capsule Take 1 capsule (100 mg total) by mouth 2 (two) times daily. 07/09/13   Nathaniel Ax, MD  finasteride (PROSCAR) 5 MG tablet Take 1 tablet (5 mg total) by mouth daily. 06/20/13   Nathaniel Lis, MD  Fluticasone-Salmeterol (ADVAIR) 500-50 MCG/DOSE AEPB Inhale 1 puff into the lungs 2 (two) times daily. 02/09/13   Nathaniel Carwin, MD  gabapentin (NEURONTIN)  100 MG capsule Take 200 mg by mouth 3 (three) times daily. 11/06/12 11/06/13  Nathaniel Dickens, MD  metFORMIN (GLUCOPHAGE) 500 MG tablet Take 1 tablet (500 mg total) by mouth 2 (two) times daily with a meal. 1 tablet by mouth twice daily for diabetes 09/16/12   Nathaniel Boyer, MD  montelukast (SINGULAIR) 10 MG tablet Take 1 tablet (10 mg total) by mouth at bedtime. 06/20/13   Nathaniel Lis, MD  pantoprazole (PROTONIX) 40 MG tablet Take 1 tablet (40 mg total) by mouth daily.  07/16/13   Nathaniel P Le, DO  polyethylene glycol powder (GLYCOLAX/MIRALAX) powder Take 17 g by mouth 2 (two) times daily as needed. 06/21/13   Nathaniel P Le, DO  predniSONE (DELTASONE) 50 MG tablet Please take 50 mg daily for three more days with breakfast.  Please take 40 mg daily for 5 days at breakfast  Please take 30 mg daily for 5 days at breakfast  Please take 20 mg daily for 5 days at breakfast  Please take 10 mg daily for 5 days at breakfast 07/09/13   Nathaniel Ax, MD  simvastatin (ZOCOR) 20 MG tablet Take 1 tablet (20 mg total) by mouth daily at 6 PM. 06/20/13   Nathaniel Lis, MD  tamsulosin (FLOMAX) 0.4 MG CAPS capsule Take 2 capsules (0.8 mg total) by mouth daily after supper. 06/20/13   Nathaniel Lis, MD  tiotropium (SPIRIVA) 18 MCG inhalation capsule Place 1 capsule (18 mcg total) into inhaler and inhale daily. 06/20/13   Nathaniel Lis, MD   Physical Exam: Filed Vitals:   09/13/13 2240  BP: 196/100  Pulse:   Temp:   Resp:     BP 196/100  Pulse 121  Temp(Src) 97.8 F (36.6 C) (Oral)  Resp 32  SpO2 100%  General: Currently, in no acute distress Eyes: PERRL, normal lids, irises & conjunctiva ENT: grossly normal hearing, lips & tongue Neck: no LAD, masses or thyromegaly Cardiovascular: S1, S2, with tachycardia No [name] edema. Telemetry: Sinus tachycardia Respiratory: Breathing is mildly labored. Diffuse expiratory wheezes throughout all lung fields with audible breath sounds down to the bases. Mild use of accessory muscles. Abdomen: Positive bowel sounds, soft, mild tenderness left lower quadrant without rigidity, distention, or masses palpated. Skin: no rash or induration seen on limited exam Musculoskeletal: No acute hot red joints. Mildly tender over the left pectoris major; no erythema or warmth. Psychiatric: He appears to be alert and oriented. His speech is [place]. Neurologic: grossly non-focal; cranial nerves II through XII appear to be intact. Strength is 5 over 5  throughout. Sensation is intact.           Labs on Admission:  Basic Metabolic Panel:  Recent Labs Lab 09/13/13 2255  NA 145  K 4.0  CL 104  CO2 28  GLUCOSE 186*  BUN 22  CREATININE 0.80  CALCIUM 9.3   Liver Function Tests: No results found for this basename: AST, ALT, ALKPHOS, BILITOT, PROT, ALBUMIN,  in the last 168 hours No results found for this basename: LIPASE, AMYLASE,  in the last 168 hours No results found for this basename: AMMONIA,  in the last 168 hours CBC:  Recent Labs Lab 09/13/13 2255  WBC 9.1  NEUTROABS 5.8  HGB 13.4  HCT 41.6  MCV 90.4  PLT 215   Cardiac Enzymes: No results found for this basename: CKTOTAL, CKMB, CKMBINDEX, TROPONINI,  in the last 168 hours  BNP (last 3 results)  Recent Labs  06/17/13 0540 07/07/13 1117 09/13/13 2255  PROBNP 77.6 36.9 60.7   CBG: No results found for this basename: GLUCAP,  in the last 168 hours  Radiological Exams on Admission: Dg Chest Portable 1 View  09/13/2013   CLINICAL DATA:  Respiratory distress.  EXAM: PORTABLE CHEST - 1 VIEW  COMPARISON:  Chest radiograph performed 07/08/2013, and CTA of the chest performed 07/07/2013  FINDINGS: The lungs are well-aerated and clear. There is no evidence of focal opacification, pleural effusion or pneumothorax.  The cardiomediastinal silhouette is within normal limits. No acute osseous abnormalities are seen.  IMPRESSION: No acute cardiopulmonary process seen.   Electronically Signed   By: Garald Balding M.D.   On: 09/13/2013 23:29    EKG: Independently reviewed. As above in history of present illness. EKG similar to January 2015 EKG with left anterior fascicular block and ST-T wave abnormalities; significantly different from February 2014 EKG.  Assessment/Plan Principal Problem:   COPD with exacerbation Active Problems:   Respiratory failure with hypoxia   HTN (hypertension), malignant   Sinus tachycardia   Abnormal EKG   DIABETES MELLITUS   GERD  (gastroesophageal reflux disease)   Left-sided chest wall pain   1. COPD with exacerbation/respiratory failure with hypoxia. The patient has had previous hospitalizations for the same. He presented with significant respiratory distress, but with treatment in the ED, his symptoms have abated a little. His ABG reveals hypoxemia, but the normal PCO2 is somewhat reassuring. He received a continuous neb and additional albuterol/Atrovent nebulizers in the ED and 125 mg of Solu-Medrol. We will continue bronchodilator therapy with Xopenex (secondary to the tachycardia), Atrovent, and Dulera. We'll continue Solu-Medrol at 80 mg every 6 hours. Titrate oxygen to keep his oxygen saturations greater than 90%. We'll add IV Levaquin empirically. 2. Malignant hypertension. The medication list will need to be reviewed by pharmacy, but I see no antihypertensive medication from the previous hospitalization. Will start Catapres patch and when necessary IV hydralazine. The increased blood pressure may be secondary to respiratory distress. 3. Sinus tachycardia, likely secondary to respiratory distress/COPD exacerbation. As above, will discontinue scheduled albuterol in favor of Xopenex. Will order a TSH. 4. Abnormal EKG with chest wall pain. The patient's EKG reveals sinus tachycardia, right bundle branch block, left anterior fascicular block, and non-specific ST and T wave abnormalities. A previous EKG did reveal ST-T wave abnormalities and left anterior fascicular block. Will order troponin I x3 and a followup EKG tomorrow morning.  5. Diabetes mellitus. He is treated chronically with metformin. We will add sliding scale NovoLog and twice a day dosing of Levemir in anticipation of steroid-induced hyperglycemia. We'll also order hemoglobin A1c.  6.  GERD. We'll continue Protonix.    Code Status: Full code  Family Communication: family not available  Disposition Plan: anticipate discharge to home when medically  appropriate.   Time spent: 1 hour and 20 minutes.  [facility] Pager [phone]

## 2013-09-14 NOTE — Progress Notes (Signed)
INITIAL NUTRITION ASSESSMENT  DOCUMENTATION CODES Per approved criteria  -Underweight   INTERVENTION: - Ensure Complete BID - Will continue to monitor   NUTRITION DIAGNOSIS: Increased nutrient needs related to underweight with COPD exacerbation as evidenced by BMI of 18.22 kg/(m^2), MD notes.   Goal: Pt to consume >90% of meals/supplements  Monitor:  Weights, labs, intake  Reason for Assessment: Underweight  80 y.o. male  Admitting Dx: COPD with exacerbation  ASSESSMENT: Presented with wheezing and shortness of breath, hx of COPD. Found to have COPD with exacerbation. Alone and asleep in room. Called pt's daughter to obtain diet hx. She reports pt has a great appetite, eats healthy - lots of vegetables and rice, and has always been a thin man.   Potassium slightly low, getting replacement in IVF.  Height: Ht Readings from Last 1 Encounters:  09/14/13 5\' 2"  (1.575 m)    Weight: Wt Readings from Last 1 Encounters:  09/14/13 99 lb 10.4 oz (45.2 kg)    Ideal Body Weight: 118 lbs   % Ideal Body Weight: 84%  Wt Readings from Last 10 Encounters:  09/14/13 99 lb 10.4 oz (45.2 kg)  07/08/13 105 lb 9.6 oz (47.9 kg)  06/30/13 103 lb 3.2 oz (46.811 kg)  06/26/13 101 lb (45.813 kg)  06/21/13 105 lb (47.628 kg)  06/20/13 100 lb 11.2 oz (45.677 kg)  02/09/13 105 lb (47.628 kg)  02/01/13 110 lb 0.2 oz (49.9 kg)  11/10/12 115 lb (52.164 kg)  02/23/12 116 lb 12.8 oz (52.98 kg)    Usual Body Weight: 99 lbs per daughter  % Usual Body Weight: 100%  BMI:  Body mass index is 18.22 kg/(m^2). Underweight  Estimated Nutritional Needs: Kcal: 1400-1600 Protein: 70-80g Fluid: 1.4-1.6L/day  Skin: WNL  Diet Order: Carb Control  EDUCATION NEEDS: -No education needs identified at this time   Intake/Output Summary (Last 24 hours) at 09/14/13 1523 Last data filed at 09/14/13 1100  Gross per 24 hour  Intake 690.42 ml  Output      0 ml  Net 690.42 ml    Last BM:  PTA  Labs:   Recent Labs Lab 09/13/13 2255 09/14/13 0324  NA 145 145  K 4.0 3.4*  CL 104 106  CO2 28 21  BUN 22 26*  CREATININE 0.80 0.65  CALCIUM 9.3 9.0  GLUCOSE 186* 286*    CBG (last 3)   Recent Labs  09/14/13 0142 09/14/13 0727 09/14/13 1209  GLUCAP 273* 241* 301*    Scheduled Meds: . benzonatate  200 mg Oral TID  . cloNIDine  0.1 mg Transdermal Weekly  . docusate sodium  100 mg Oral BID  . enoxaparin (LOVENOX) injection  40 mg Subcutaneous Q24H  . finasteride  5 mg Oral Daily  . gabapentin  200 mg Oral BID  . guaiFENesin  600 mg Oral BID  . insulin aspart  0-20 Units Subcutaneous TID WC  . insulin aspart  0-5 Units Subcutaneous QHS  . insulin detemir  10 Units Subcutaneous QHS  . ipratropium  0.5 mg Nebulization Q4H  . levalbuterol  0.63 mg Nebulization Q4H  . levofloxacin (LEVAQUIN) IV  500 mg Intravenous Q24H  . methylPREDNISolone (SOLU-MEDROL) injection  80 mg Intravenous Q6H  . mometasone-formoterol  2 puff Inhalation BID  . montelukast  10 mg Oral QHS  . pantoprazole  40 mg Oral Daily  . senna  1 tablet Oral QHS  . simvastatin  20 mg Oral q1800  . tamsulosin  0.8 mg Oral  QPC supper    Continuous Infusions: . 0.9 % NaCl with KCl 20 mEq / L 65 mL/hr at 09/14/13 0155    Past Medical History  Diagnosis Date  . Hyperlipidemia   . Hypertension   . Diabetic neuropathy     /H&P 01/30/2013 (01/30/2013)  . Diabetes mellitus   . BPH (benign prostatic hyperplasia)     /H&P 01/30/2013 (01/30/2013)  . Emphysematous cystitis     /H&P 01/30/2013 (01/30/2013)  . COPD (chronic obstructive pulmonary disease)   . Orthopnoea     Past Surgical History  Procedure Laterality Date  . No past surgeries       Mikey College MS, RD, [place] Pager [phone] After Hours Pager

## 2013-09-14 NOTE — ED Notes (Signed)
Ami (daughter) [phone]

## 2013-09-14 NOTE — ED Provider Notes (Signed)
CSN: [id number]     Arrival date & time 09/13/13  2239 History   First MD Initiated Contact with Patient 09/13/13 2256     Chief Complaint  Patient presents with  . Respiratory Distress     (Consider location/radiation/quality/duration/timing/severity/associated sxs/prior Treatment) HPI HX per EMS and limited history per patient  - his primary language is [place].  He has a history of COPD, lives at home with family who called EMS tonight. Patient had been having increased difficulty breathing today at home despite using his inhaler and taking his medications. EMS provided Solu-Medrol and albuterol in route with minimal improvement. No hypoxia reported.  No chest pain. No fevers. No productive cough. No leg pain or leg swelling. Past Medical History  Diagnosis Date  . Hyperlipidemia   . Hypertension   . Diabetic neuropathy     /H&P 01/30/2013 (01/30/2013)  . Diabetes mellitus   . BPH (benign prostatic hyperplasia)     /H&P 01/30/2013 (01/30/2013)  . Emphysematous cystitis     /H&P 01/30/2013 (01/30/2013)  . COPD (chronic obstructive pulmonary disease)   . Orthopnoea    Past Surgical History  Procedure Laterality Date  . No past surgeries     History reviewed. No pertinent family history. History  Substance Use Topics  . Smoking status: Former Smoker -- 0.25 packs/day for 42 years    Types: Cigarettes  . Smokeless tobacco: Never Used     Comment: 01/30/2013 "quit smoking ~ 10 yr ago"  . Alcohol Use: No    Review of Systems  Constitutional: Negative for fever and chills.  Respiratory: Positive for shortness of breath and wheezing.   Cardiovascular: Negative for chest pain and leg swelling.  Gastrointestinal: Negative for abdominal pain.  Genitourinary: Negative for flank pain.  Musculoskeletal: Negative for back pain.  Skin: Negative for rash.  Neurological: Negative for weakness and numbness.  All other systems reviewed and are negative.      Allergies  Review of  patient's allergies indicates no known allergies.  Home Medications   Current Outpatient Rx  Name  Route  Sig  Dispense  Refill  . albuterol (PROVENTIL) (2.5 MG/3ML) 0.083% nebulizer solution   Nebulization   Take 3 mLs (2.5 mg total) by nebulization every 4 (four) hours as needed for wheezing or shortness of breath.   75 mL   12   . albuterol (VENTOLIN HFA) 108 (90 BASE) MCG/ACT inhaler   Inhalation   Inhale 2 puffs into the lungs every 4 (four) hours while awake.   1 Inhaler   12   . benzonatate (TESSALON) 200 MG capsule   Oral   Take 1 capsule (200 mg total) by mouth 3 (three) times daily as needed for cough.   30 capsule   0   . doxycycline (VIBRAMYCIN) 100 MG capsule   Oral   Take 1 capsule (100 mg total) by mouth 2 (two) times daily.   10 capsule   0     Please take for 5 more days two times daily.   . finasteride (PROSCAR) 5 MG tablet   Oral   Take 1 tablet (5 mg total) by mouth daily.   30 tablet   0   . Fluticasone-Salmeterol (ADVAIR) 500-50 MCG/DOSE AEPB   Inhalation   Inhale 1 puff into the lungs 2 (two) times daily.   60 each   11   . gabapentin (NEURONTIN) 100 MG capsule   Oral   Take 200 mg by mouth 3 (three) times  daily.         . metFORMIN (GLUCOPHAGE) 500 MG tablet   Oral   Take 1 tablet (500 mg total) by mouth 2 (two) times daily with a meal. 1 tablet by mouth twice daily for diabetes   60 tablet   5   . montelukast (SINGULAIR) 10 MG tablet   Oral   Take 1 tablet (10 mg total) by mouth at bedtime.   30 tablet   0   . pantoprazole (PROTONIX) 40 MG tablet   Oral   Take 1 tablet (40 mg total) by mouth daily.   30 tablet   5   . polyethylene glycol powder (GLYCOLAX/MIRALAX) powder   Oral   Take 17 g by mouth 2 (two) times daily as needed.   3350 g   1   . predniSONE (DELTASONE) 50 MG tablet      Please take 50 mg daily for three more days with breakfast.  Please take 40 mg daily for 5 days at breakfast  Please take 30 mg  daily for 5 days at breakfast  Please take 20 mg daily for 5 days at breakfast  Please take 10 mg daily for 5 days at breakfast   65 tablet   0   . simvastatin (ZOCOR) 20 MG tablet   Oral   Take 1 tablet (20 mg total) by mouth daily at 6 PM.   30 tablet   0   . tamsulosin (FLOMAX) 0.4 MG CAPS capsule   Oral   Take 2 capsules (0.8 mg total) by mouth daily after supper.   30 capsule   0   . tiotropium (SPIRIVA) 18 MCG inhalation capsule   Inhalation   Place 1 capsule (18 mcg total) into inhaler and inhale daily.   30 capsule   12    BP 196/100  Pulse 121  Temp(Src) 97.8 F (36.6 C) (Oral)  Resp 32  SpO2 100% Physical Exam  Constitutional: He is oriented to person, place, and time. He appears well-developed and well-nourished.  HENT:  Head: Normocephalic and atraumatic.  Mouth/Throat: Oropharynx is clear and moist.  Eyes: EOM are normal. Pupils are equal, round, and reactive to light.  Neck: Neck supple.  Cardiovascular: Regular rhythm and intact distal pulses.   Tachycardic  Pulmonary/Chest:  Tachypnea with retractions, inspiratory expiratory wheezes bilaterally with tight lung sounds  Abdominal: Soft. He exhibits no distension. There is no tenderness.  Musculoskeletal: Normal range of motion.  No lower extremity edema or tenderness  Neurological: He is alert and oriented to person, place, and time.  Skin: Skin is warm and dry.    ED Course  Procedures (including critical care time) Labs Review Labs Reviewed  BASIC METABOLIC PANEL - Abnormal; Notable for the following:    Glucose, Bld 186 (*)    GFR calc non Af Amer 83 (*)    All other components within normal limits  CBC WITH DIFFERENTIAL - Abnormal; Notable for the following:    Eosinophils Relative 10 (*)    Eosinophils Absolute 0.9 (*)    All other components within normal limits  PRO B NATRIURETIC PEPTIDE  BLOOD GAS, ARTERIAL  I-STAT TROPOININ, ED   Imaging Review Dg Chest Portable 1  View  09/13/2013   CLINICAL DATA:  Respiratory distress.  EXAM: PORTABLE CHEST - 1 VIEW  COMPARISON:  Chest radiograph performed 07/08/2013, and CTA of the chest performed 07/07/2013  FINDINGS: The lungs are well-aerated and clear. There is no evidence of focal  opacification, pleural effusion or pneumothorax.  The cardiomediastinal silhouette is within normal limits. No acute osseous abnormalities are seen.  IMPRESSION: No acute cardiopulmonary process seen.   Electronically Signed   By: Garald Balding M.D.   On: 09/13/2013 23:29     EKG Interpretation   Date/Time:  Thursday September 13 2013 22:41:12 EDT Ventricular Rate:  120 PR Interval:  146 QRS Duration: 138 QT Interval:  350 QTC Calculation: 494 R Axis:   -101 Text Interpretation:  Sinus tachycardia Probable left atrial enlargement  RBBB and LAFB Nonspecific ST and T wave abnormality Abnormal ECG Confirmed  by Kavan Devan  MD, Arion Shankles ([id number]) on 09/13/2013 11:20:45 PM     Repeat Albuterol and Atrovent provided.  On recheck is still wheezing with minimal improvement. Repeat breathing treatment and consult medicine for admission. Discussed with Dr. Caryn Section, she evaluated bedside and will admit to step down unit MDM   Diagnosis: COPD exacerbation  Elderly gentleman, history of COPD, increased wheezing and dyspnea despite inhalers  Evaluated with EKG, labs and imaging reviewed as above. Serial evaluations, breathing treatments, had steroids in route Medical admit     Teressa Lower, MD 09/14/13 [phone]

## 2013-09-14 NOTE — Progress Notes (Signed)
CARE MANAGEMENT NOTE 09/14/2013  Patient:  Forry,Arty   Account Number:  [id number]  Date Initiated:  09/14/2013  Documentation initiated by:  Raynell Upton  Subjective/Objective Assessment:   pt with hx of copd and recent increase in sysptoms not responsive to outpt therapies, admitted with increased wob and wheezing.     Action/Plan:   home when stable   Anticipated DC Date:  09/17/2013   Anticipated DC Plan:  HOME/SELF CARE  In-house referral  NA      DC Planning Services  NA      [facility] Choice  NA   Choice offered to / List presented to:  NA   DME arranged  NA      DME agency  NA     [place] arranged  NA      [place] agency  NA   Status of service:  In process, will continue to follow Medicare Important Message given?  NA - LOS <3 / Initial given by admissions (If response is "NO", the following Medicare IM given date fields will be blank) Date Medicare IM given:   Date Additional Medicare IM given:    Discharge Disposition:    Per UR Regulation:  Reviewed for med. necessity/level of care/duration of stay  If discussed at [place] of Stay Meetings, dates discussed:    Comments:  [id number]/Evalie Hargraves Eldridge Dace, [place], [place] [phone] Chart Reviewed for discharge and hospital needs. Discharge needs at time of review: None present will follow for needs. Review of patient progress due on [phone].

## 2013-09-15 LAB — CBC
HCT: 33.4 % — ABNORMAL LOW (ref 39.0–52.0)
Hemoglobin: 10.8 g/dL — ABNORMAL LOW (ref 13.0–17.0)
MCH: 28.6 pg (ref 26.0–34.0)
MCHC: 32.3 g/dL (ref 30.0–36.0)
MCV: 88.4 fL (ref 78.0–100.0)
Platelets: 188 10*3/uL (ref 150–400)
RBC: 3.78 MIL/uL — ABNORMAL LOW (ref 4.22–5.81)
RDW: 14.9 % (ref 11.5–15.5)
WBC: 17.4 10*3/uL — ABNORMAL HIGH (ref 4.0–10.5)

## 2013-09-15 LAB — BASIC METABOLIC PANEL
BUN: 32 mg/dL — ABNORMAL HIGH (ref 6–23)
CHLORIDE: 108 meq/L (ref 96–112)
CO2: 23 mEq/L (ref 19–32)
Calcium: 8.7 mg/dL (ref 8.4–10.5)
Creatinine, Ser: 0.64 mg/dL (ref 0.50–1.35)
GFR calc Af Amer: >90 mL/min (ref >90)
Glucose, Bld: 270 mg/dL — ABNORMAL HIGH (ref 70–99)
Potassium: 4.4 mEq/L (ref 3.7–5.3)
SODIUM: 140 meq/L (ref 137–147)

## 2013-09-15 LAB — GLUCOSE, CAPILLARY
GLUCOSE-CAPILLARY: 277 mg/dL — AB (ref 70–99)
GLUCOSE-CAPILLARY: 114 mg/dL — AB (ref 70–99)
Glucose-Capillary: 243 mg/dL — ABNORMAL HIGH (ref 70–99)
Glucose-Capillary: 316 mg/dL — ABNORMAL HIGH (ref 70–99)

## 2013-09-15 MED ORDER — IPRATROPIUM BROMIDE 0.02 % IN SOLN
0.5000 mg | Freq: Four times a day (QID) | RESPIRATORY_TRACT | Status: DC
  Administered 2013-09-16 (×2): 0.5 mg via RESPIRATORY_TRACT
  Filled 2013-09-15 (×2): qty 2.5

## 2013-09-15 MED ORDER — ALBUTEROL SULFATE (2.5 MG/3ML) 0.083% IN NEBU: 2.5000 mg | INHALATION_SOLUTION | Freq: Four times a day (QID) | RESPIRATORY_TRACT | Status: DC | PRN

## 2013-09-15 MED ORDER — METHYLPREDNISOLONE SODIUM SUCC 125 MG IJ SOLR
60.0000 mg | Freq: Every day | INTRAMUSCULAR | Status: DC
  Administered 2013-09-16: 60 mg via INTRAVENOUS
  Filled 2013-09-15: qty 0.96

## 2013-09-15 MED ORDER — LEVALBUTEROL HCL 0.63 MG/3ML IN NEBU
0.6300 mg | INHALATION_SOLUTION | RESPIRATORY_TRACT | Status: DC
  Administered 2013-09-15 – 2013-09-16 (×4): 0.63 mg via RESPIRATORY_TRACT
  Filled 2013-09-15 (×11): qty 3

## 2013-09-15 NOTE — Progress Notes (Signed)
[facility] PROGRESS NOTE  [address] XBM:[id number] DOB: 08-Jan-1934 DOA: 09/13/2013 PCP: [name], DO  Assessment/Plan:  Principal Problem:   COPD with exacerbation/  Respiratory failure with hypoxia - Will plan on decreasing his steroid regimen to 60 mg IV daily - Continue bronchodilators - Continue Levaquin - supplemental oxygen  Leukocytosis  - Most likely 2ary to recent steroid administration. Clinically patient improved with decrease in wheezing.  Underweight - Nutritional services has evaluated with recommended goals of pt to consume > 90% of meals/supplements - Continue Ensure complete BID  Active Problems:   DIABETES MELLITUS - Pt on Levemir and SSI - Will continue routine cbg monitoring and diabetic diet.    GERD (gastroesophageal reflux disease) - stable with protonix on board    HTN (hypertension), malignant - Pt on Clonidine, will continue to monitor blood pressures    Sinus tachycardia - suspect this was initially due to dehydration and side effect of albuterol    Left-sided chest wall pain - Most likely related to acute copd exacerbation - troponins negative x 3  Code Status: full Family Communication: No family at bedside Disposition Plan: Pending improvement in respiratory condition.   Consultants:  None  Procedures:  none  Antibiotics:  Levaquin since admission.  HPI/Subjective: Pt inquiring about discharge date. He is currently feeling better.  No new complaints otherwise.  Objective: Filed Vitals:   09/15/13 0800  BP: 150/89  Pulse: 114  Temp: 97.1 F (36.2 C)  Resp: 17    Intake/Output Summary (Last 24 hours) at 09/15/13 0919 Last data filed at 09/15/13 0800  Gross per 24 hour  Intake   1795 ml  Output     70 ml  Net   1725 ml   Filed Weights   09/14/13 0125 09/15/13 0400  Weight: 45.2 kg (99 lb 10.4 oz) 47 kg (103 lb 9.9 oz)    Exam:   General:  Pt in NAD, Alert and awake  Cardiovascular: Normal S1  and S2, No Murmurs  Respiratory: CTA BL, no wheezes  Abdomen: soft, NT, ND  Musculoskeletal: no cyanosis or clubbing   Data Reviewed: Basic Metabolic Panel:  Recent Labs Lab 09/13/13 2255 09/14/13 0324 09/15/13 0316  NA 145 145 140  K 4.0 3.4* 4.4  CL 104 106 108  CO2 28 21 23   GLUCOSE 186* 286* 270*  BUN 22 26* 32*  CREATININE 0.80 0.65 0.64  CALCIUM 9.3 9.0 8.7   Liver Function Tests:  Recent Labs Lab 09/14/13 0324  AST 14  ALT 12  ALKPHOS 85  BILITOT <0.2*  PROT 7.2  ALBUMIN 3.8   No results found for this basename: LIPASE, AMYLASE,  in the last 168 hours No results found for this basename: AMMONIA,  in the last 168 hours CBC:  Recent Labs Lab 09/13/13 2255 09/14/13 0324 09/15/13 0316  WBC 9.1 10.6* 17.4*  NEUTROABS 5.8  --   --   HGB 13.4 12.3* 10.8*  HCT 41.6 39.2 33.4*  MCV 90.4 90.1 88.4  PLT 215 191 188   Cardiac Enzymes:  Recent Labs Lab 09/14/13 0108 09/14/13 0649 09/14/13 1229  TROPONINI <0.30 <0.30 <0.30   BNP (last 3 results)  Recent Labs  06/17/13 0540 07/07/13 1117 09/13/13 2255  PROBNP 77.6 36.9 60.7   CBG:  Recent Labs Lab 09/14/13 1209 09/14/13 1636 09/14/13 1656 09/14/13 2142 09/15/13 0733  GLUCAP 301* 63* 98 272* 277*    Recent Results (from the past 240 hour(s))  MRSA PCR SCREENING  Status: None   Collection Time    09/14/13  1:29 AM      Result Value Ref Range Status   MRSA by PCR NEGATIVE  NEGATIVE Final   Comment:            The GeneXpert MRSA Assay (FDA     approved for NASAL specimens     only), is one component of a     comprehensive MRSA colonization     surveillance program. It is not     intended to diagnose MRSA     infection nor to guide or     monitor treatment for     MRSA infections.     Studies: Dg Chest Portable 1 View  09/13/2013   CLINICAL DATA:  Respiratory distress.  EXAM: PORTABLE CHEST - 1 VIEW  COMPARISON:  Chest radiograph performed 07/08/2013, and CTA of the chest  performed 07/07/2013  FINDINGS: The lungs are well-aerated and clear. There is no evidence of focal opacification, pleural effusion or pneumothorax.  The cardiomediastinal silhouette is within normal limits. No acute osseous abnormalities are seen.  IMPRESSION: No acute cardiopulmonary process seen.   Electronically Signed   By: Garald Balding M.D.   On: 09/13/2013 23:29    Scheduled Meds: . benzonatate  200 mg Oral TID  . cloNIDine  0.1 mg Transdermal Weekly  . docusate sodium  100 mg Oral BID  . enoxaparin (LOVENOX) injection  40 mg Subcutaneous Q24H  . feeding supplement (ENSURE COMPLETE)  237 mL Oral BID BM  . finasteride  5 mg Oral Daily  . gabapentin  200 mg Oral BID  . guaiFENesin  600 mg Oral BID  . insulin aspart  0-20 Units Subcutaneous TID WC  . insulin aspart  0-5 Units Subcutaneous QHS  . insulin detemir  10 Units Subcutaneous QHS  . ipratropium  0.5 mg Nebulization Q4H  . levalbuterol  0.63 mg Nebulization Q4H  . levofloxacin (LEVAQUIN) IV  500 mg Intravenous Q24H  . [START ON 09/16/2013] methylPREDNISolone (SOLU-MEDROL) injection  60 mg Intravenous Daily  . mometasone-formoterol  2 puff Inhalation BID  . montelukast  10 mg Oral QHS  . pantoprazole  40 mg Oral Daily  . senna  1 tablet Oral QHS  . simvastatin  20 mg Oral q1800  . tamsulosin  0.8 mg Oral QPC supper   Continuous Infusions: . 0.9 % NaCl with KCl 20 mEq / L 65 mL/hr at 09/15/13 0909     Time spent: > 35 minutes    Velvet Bathe  [facility] Pager [phone] If 7PM-7AM, please contact night-coverage at [email or web address], password [facility] 09/15/2013, 9:19 AM  LOS: 2 days

## 2013-09-15 NOTE — Progress Notes (Signed)
Ambulated pt to bathroom, very dyspnic but sats were 95% on room air.  Quickly came up to 98% once in bed with deep breathing.

## 2013-09-15 NOTE — Progress Notes (Signed)
Recd pt from ICU, little English, but denies pain, is smiling, no dyspnea.  Sitting up eating lunch.

## 2013-09-15 NOTE — Progress Notes (Signed)
Pt has rang out vtach on tele since transfer from ICU, pt is asymptomatic, smiling, denies chest pain.  12 lead EKG done anyway-reads STBBB, no change from last EKG. Dr Wendee Beavers aware, see new orders.  Will advise MD with any complications.

## 2013-09-16 LAB — CBC
HCT: 37.1 % — ABNORMAL LOW (ref 39.0–52.0)
HEMOGLOBIN: 12 g/dL — AB (ref 13.0–17.0)
MCH: 28.6 pg (ref 26.0–34.0)
MCHC: 32.3 g/dL (ref 30.0–36.0)
MCV: 88.3 fL (ref 78.0–100.0)
Platelets: 195 10*3/uL (ref 150–400)
RBC: 4.2 MIL/uL — ABNORMAL LOW (ref 4.22–5.81)
RDW: 15.1 % (ref 11.5–15.5)
WBC: 11.8 10*3/uL — AB (ref 4.0–10.5)

## 2013-09-16 LAB — GLUCOSE, CAPILLARY
GLUCOSE-CAPILLARY: 73 mg/dL (ref 70–99)
Glucose-Capillary: 295 mg/dL — ABNORMAL HIGH (ref 70–99)

## 2013-09-16 MED ORDER — ENSURE COMPLETE PO LIQD: 237.0000 mL | Freq: Two times a day (BID) | ORAL | Status: AC

## 2013-09-16 MED ORDER — LEVOFLOXACIN 500 MG PO TABS: 500.0000 mg | ORAL_TABLET | Freq: Every day | ORAL | Status: AC

## 2013-09-16 MED ORDER — PREDNISONE 20 MG PO TABS: 40.0000 mg | ORAL_TABLET | Freq: Every day | ORAL | Status: AC

## 2013-09-16 NOTE — Discharge Summary (Signed)
Physician Discharge Summary  Nathaniel [name] DTO:[id number] DOB: 14-Dec-1933 DOA: 09/13/2013  PCP: Nathaniel Pain, DO  Admit date: 09/13/2013 Discharge date: 09/16/2013  Time spent: > 35 minutes  Recommendations for Outpatient Follow-up:  1. Patient recommended to follow up with his pcp in 1-2 weeks or sooner 2. Will be d/c'd on a short course of prednisone and antibiotics  Discharge Diagnoses:  Principal Problem:   COPD with exacerbation Active Problems:   DIABETES MELLITUS   GERD (gastroesophageal reflux disease)   HTN (hypertension), malignant   Sinus tachycardia   Abnormal EKG   Left-sided chest wall [name]   Respiratory failure with hypoxia   Discharge Condition: stable  Diet recommendation: carb modified diet  Filed Weights   09/14/13 0125 09/15/13 0400 09/15/13 1141  Weight: 45.2 kg (99 lb 10.4 oz) 47 kg (103 lb 9.9 oz) 47 kg (103 lb 9.9 oz)    History of present illness:  Patient is a 80 y/o with h/o copd, DM, BPH who presented to the ED complaining of SOB.  Hospital Course:  Principal Problem:  COPD with exacerbation/ Respiratory failure with hypoxia  - Will discharge on prednisone 40 mg po daily for the next 2 days after discharge. - Continue bronchodilators  - Continue Levaquin to complete a 7 day total course of antibiotics - supplemental oxygen   Leukocytosis  - Most likely 2ary to recent steroid administration.  Is currently trending down. - will d/c on levaquin   Underweight  - Nutritional services has evaluated with recommended goals of pt to consume > 90% of meals/supplements  - Continue Ensure complete BID on discharge.  Active Problems:   DIABETES MELLITUS  - Pt to continue his home regimen metformin - Blood sugars elevated due to steroid use. - Patient's steroid dose will be decreased and he will only have a short course.  As such I believe his metformin will suffice.  He is to continue monitoring his blood sugars  GERD (gastroesophageal reflux  disease)  - stable patient to continue his home regimen  HTN (hypertension), malignant  - Pt's blood pressures have fluctuated. - will have patient follow up with his pcp for further evaluation and recommendations.  Sinus tachycardia  - suspect this was initially due to dehydration and side effect of albuterol. - resolved on d/c day with last HR at 98  Left-sided chest wall [name]  - Most likely related to acute copd exacerbation  - troponins negative x 3  Procedures:  None  Consultations:  None  Discharge Exam: Filed Vitals:   09/16/13 0532  BP: 159/96  Pulse: 98  Temp: 97.7 F (36.5 C)  Resp: 18    General: Pt in NAD, alert and awake Cardiovascular: RRR, no MRG Respiratory: CTA BL, no wheezes, breath sounds BL.  Discharge Instructions You were cared for by a hospitalist during your hospital stay. If you have any questions about your discharge medications or the care you received while you were in the hospital after you are discharged, you can call the unit and asked to speak with the hospitalist on call if the hospitalist that took care of you is not available. Once you are discharged, your primary care physician will handle any further medical issues. Please note that NO REFILLS for any discharge medications will be authorized once you are discharged, as it is imperative that you return to your primary care physician (or establish a relationship with a primary care physician if you do not have one) for your aftercare needs  so that they can reassess your need for medications and monitor your lab values.  Discharge Orders   Future Orders Complete By Expires   Call MD for:  difficulty breathing, headache or visual disturbances  As directed    Call MD for:  temperature >100.4  As directed    Diet Carb Modified  As directed    Discharge instructions  As directed    Comments:     Follow up with primary care provider within two weeks   Increase activity slowly  As directed         Medication List         albuterol 108 (90 BASE) MCG/ACT inhaler  Commonly known as:  VENTOLIN HFA  Inhale 2 puffs into the lungs every 4 (four) hours while awake.     albuterol (2.5 MG/3ML) 0.083% nebulizer solution  Commonly known as:  PROVENTIL  Take 3 mLs (2.5 mg total) by nebulization every 4 (four) hours as needed for wheezing or shortness of breath.     feeding supplement (ENSURE COMPLETE) Liqd  Take 237 mLs by mouth 2 (two) times daily between meals.     finasteride 5 MG tablet  Commonly known as:  PROSCAR  Take 1 tablet (5 mg total) by mouth daily.     Fluticasone-Salmeterol 500-50 MCG/DOSE Aepb  Commonly known as:  ADVAIR  Inhale 1 puff into the lungs 2 (two) times daily.     gabapentin 100 MG capsule  Commonly known as:  NEURONTIN  Take 200 mg by mouth 3 (three) times daily.     levofloxacin 500 MG tablet  Commonly known as:  LEVAQUIN  Take 1 tablet (500 mg total) by mouth daily.     metFORMIN 500 MG tablet  Commonly known as:  GLUCOPHAGE  Take 500 mg by mouth 2 (two) times daily with a meal.     montelukast 10 MG tablet  Commonly known as:  SINGULAIR  Take 1 tablet (10 mg total) by mouth at bedtime.     omeprazole 20 MG capsule  Commonly known as:  PRILOSEC  Take 20 mg by mouth daily.     polyethylene glycol powder powder  Commonly known as:  GLYCOLAX/MIRALAX  Take 17 g by mouth 2 (two) times daily as needed.     predniSONE 20 MG tablet  Commonly known as:  DELTASONE  Take 2 tablets (40 mg total) by mouth daily with breakfast.  Start taking on:  09/17/2013     simvastatin 20 MG tablet  Commonly known as:  ZOCOR  Take 1 tablet (20 mg total) by mouth daily at 6 PM.     tamsulosin 0.4 MG Caps capsule  Commonly known as:  FLOMAX  Take 2 capsules (0.8 mg total) by mouth daily after supper.     tiotropium 18 MCG inhalation capsule  Commonly known as:  SPIRIVA  Place 1 capsule (18 mcg total) into inhaler and inhale daily.       No Known  Allergies    The results of significant diagnostics from this hospitalization (including imaging, microbiology, ancillary and laboratory) are listed below for reference.    Significant Diagnostic Studies: Dg Chest Portable 1 View  09/13/2013   CLINICAL DATA:  Respiratory distress.  EXAM: PORTABLE CHEST - 1 VIEW  COMPARISON:  Chest radiograph performed 07/08/2013, and CTA of the chest performed 07/07/2013  FINDINGS: The lungs are well-aerated and clear. There is no evidence of focal opacification, pleural effusion or pneumothorax.  The cardiomediastinal silhouette  is within normal limits. No acute osseous abnormalities are seen.  IMPRESSION: No acute cardiopulmonary process seen.   Electronically Signed   By: Garald Balding M.D.   On: 09/13/2013 23:29    Microbiology: Recent Results (from the past 240 hour(s))  MRSA PCR SCREENING     Status: None   Collection Time    09/14/13  1:29 AM      Result Value Ref Range Status   MRSA by PCR NEGATIVE  NEGATIVE Final   Comment:            The GeneXpert MRSA Assay (FDA     approved for NASAL specimens     only), is one component of a     comprehensive MRSA colonization     surveillance program. It is not     intended to diagnose MRSA     infection nor to guide or     monitor treatment for     MRSA infections.     Labs: Basic Metabolic Panel:  Recent Labs Lab 09/13/13 2255 09/14/13 0324 09/15/13 0316  NA 145 145 140  K 4.0 3.4* 4.4  CL 104 106 108  CO2 28 21 23   GLUCOSE 186* 286* 270*  BUN 22 26* 32*  CREATININE 0.80 0.65 0.64  CALCIUM 9.3 9.0 8.7   Liver Function Tests:  Recent Labs Lab 09/14/13 0324  AST 14  ALT 12  ALKPHOS 85  BILITOT <0.2*  PROT 7.2  ALBUMIN 3.8   No results found for this basename: LIPASE, AMYLASE,  in the last 168 hours No results found for this basename: AMMONIA,  in the last 168 hours CBC:  Recent Labs Lab 09/13/13 2255 09/14/13 0324 09/15/13 0316 09/16/13 1014  WBC 9.1 10.6* 17.4* 11.8*   NEUTROABS 5.8  --   --   --   HGB 13.4 12.3* 10.8* 12.0*  HCT 41.6 39.2 33.4* 37.1*  MCV 90.4 90.1 88.4 88.3  PLT 215 191 188 195   Cardiac Enzymes:  Recent Labs Lab 09/14/13 0108 09/14/13 0649 09/14/13 1229  TROPONINI <0.30 <0.30 <0.30   BNP: BNP (last 3 results)  Recent Labs  06/17/13 0540 07/07/13 1117 09/13/13 2255  PROBNP 77.6 36.9 60.7   CBG:  Recent Labs Lab 09/15/13 0733 09/15/13 1124 09/15/13 1643 09/15/13 2054 09/16/13 0722  GLUCAP 277* 243* 316* 114* 73       Signed:  Velvet Bathe  [facility] 09/16/2013, 11:53 AM

## 2013-09-16 NOTE — Progress Notes (Signed)
Discharge info explained to pts dhter Earnest Bailey because pt speaks little english

## 2014-02-19 ENCOUNTER — Telehealth: Payer: Self-pay | Admitting: *Deleted

## 2014-02-19 NOTE — Telephone Encounter (Signed)
Attempted to reach patient to schedule [place], home @ out of service and voicemail box for cell # was generic & unclear if correct recipient.  Am composing & sending letter.

## 2014-12-23 IMAGING — CR DG CHEST 1V PORT
1 series · 1 of 1 positions shown · non-contrast
Comparison: 06/17/2013

CLINICAL DATA: Chest pain.  Shortness of breath.

EXAM:
PORTABLE CHEST - 1 VIEW

[AP]
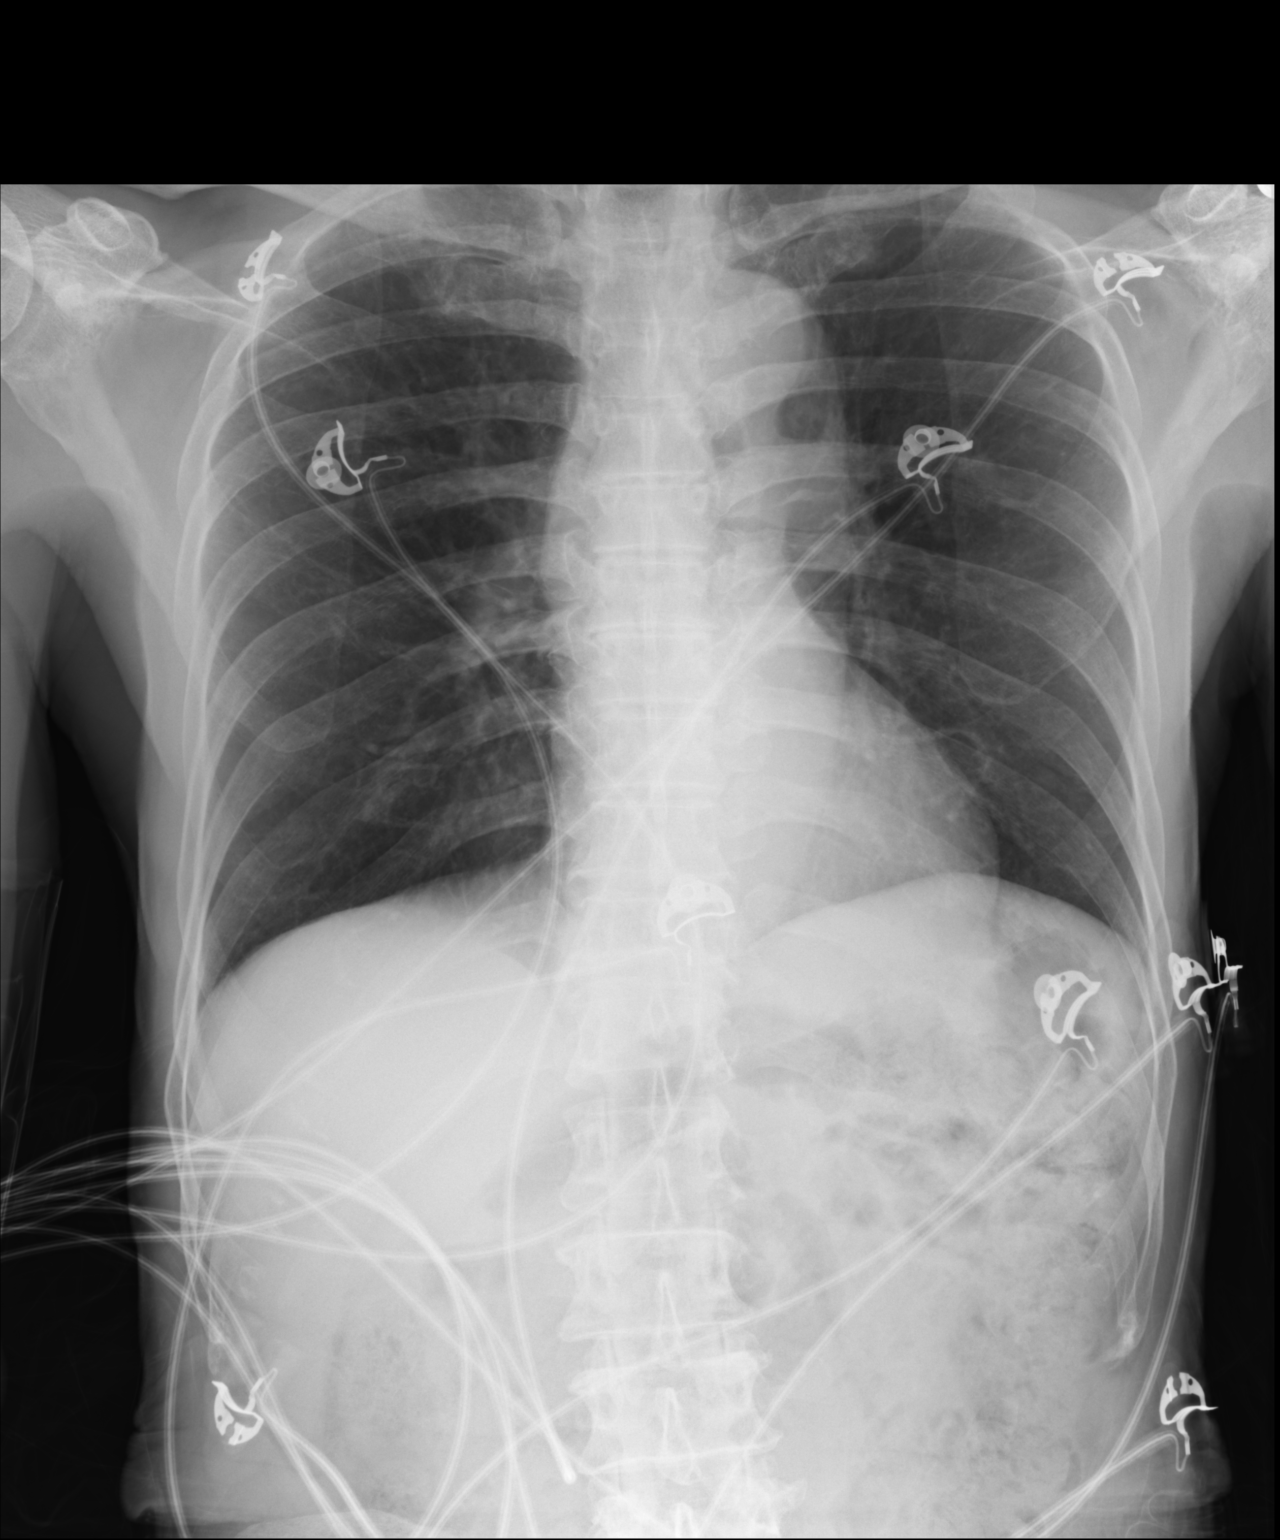

[1 of 1 positions shown; findings below may reference images not displayed]

FINDINGS: The heart size and mediastinal contours are within normal limits.
Both lungs are clear. The visualized skeletal structures are
unremarkable.
IMPRESSION: No active disease.

## 2014-12-23 IMAGING — CT CT ANGIO CHEST
2 of 8 series · 19 of 46 positions shown · IV contrast (APPLIED)
Comparison: 06/17/2013

CLINICAL DATA: Short of breath and chest pain

EXAM:
CT ANGIOGRAPHY CHEST WITH CONTRAST
TECHNIQUE: Multidetector CT imaging of the chest was performed using the
standard protocol during bolus administration of intravenous
contrast. Multiplanar CT image reconstructions including MIPs were
obtained to evaluate the vascular anatomy.
CONTRAST:  80mL OMNIPAQUE IOHEXOL 350 MG/ML SOLN

[Series 5: thins · axial · 0.55mm/px · z∈[-108,+122]mm · 16 of 254 slices shown]
[im 12/254  lung]
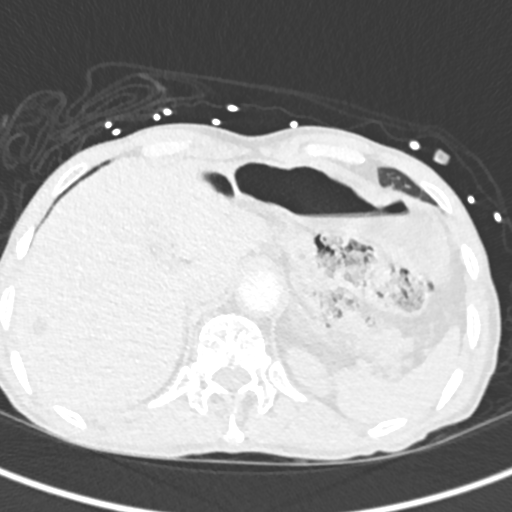
[im 24/254  soft-tissue]
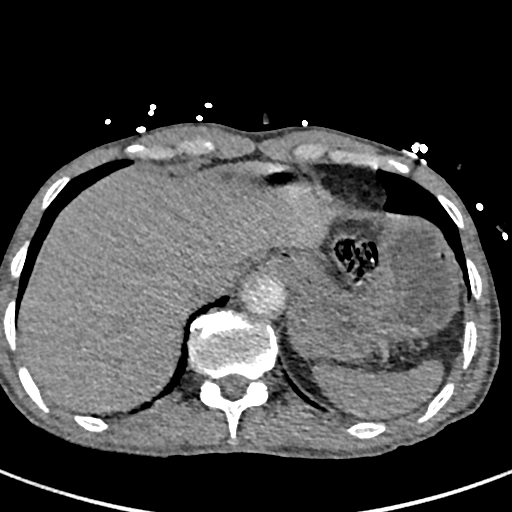
[im 47/254  lung]
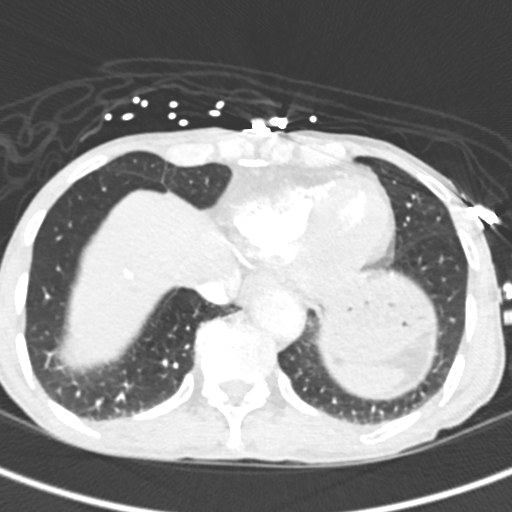
[im 58/254  soft-tissue]
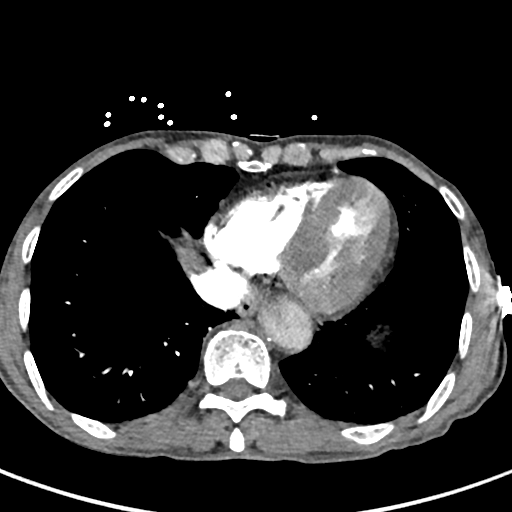
[im 70/254  lung]
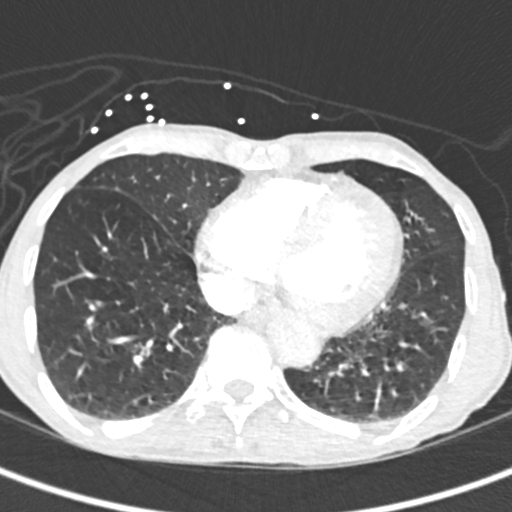
[im 93/254  soft-tissue]
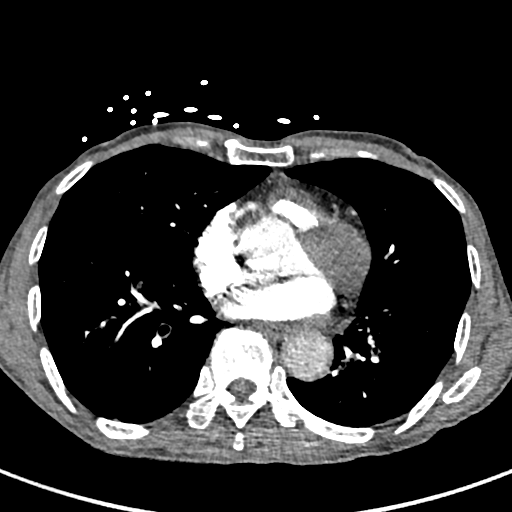
[im 104/254  lung]
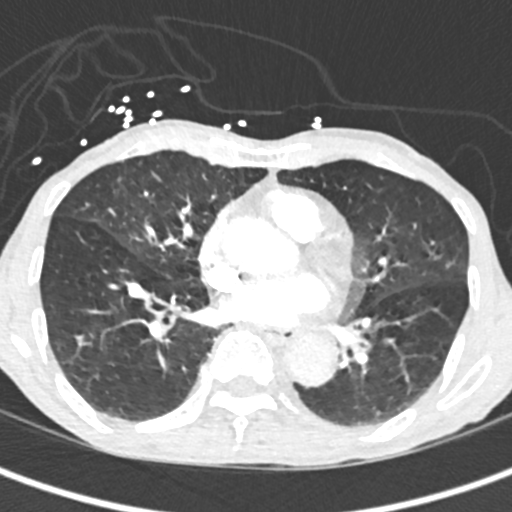
[im 116/254  soft-tissue]
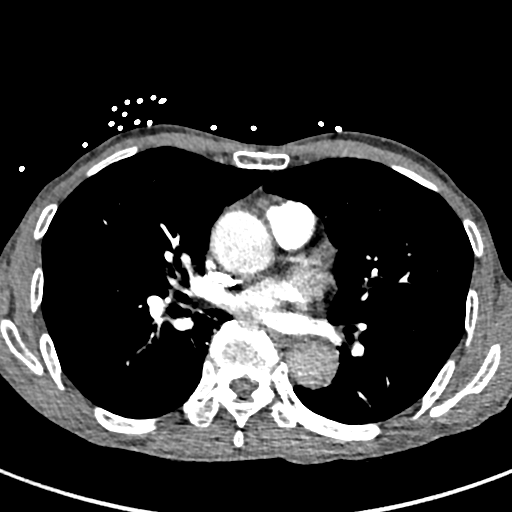
[im 139/254  lung]
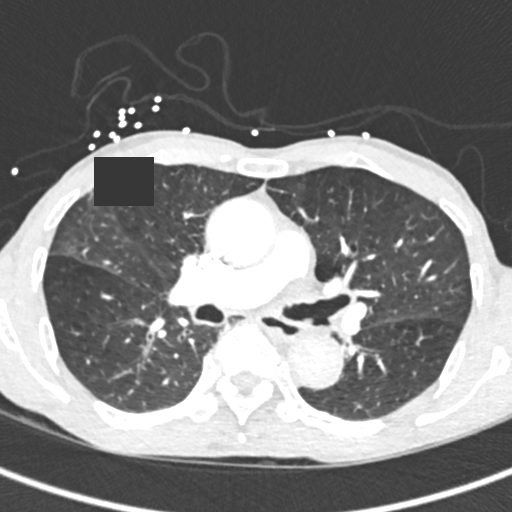
[im 150/254  soft-tissue]
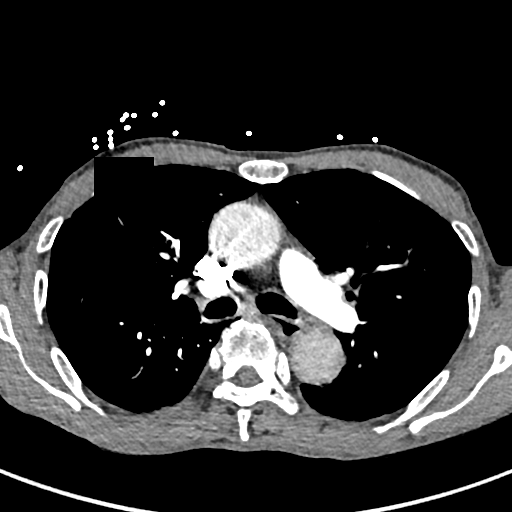
[im 162/254  lung]
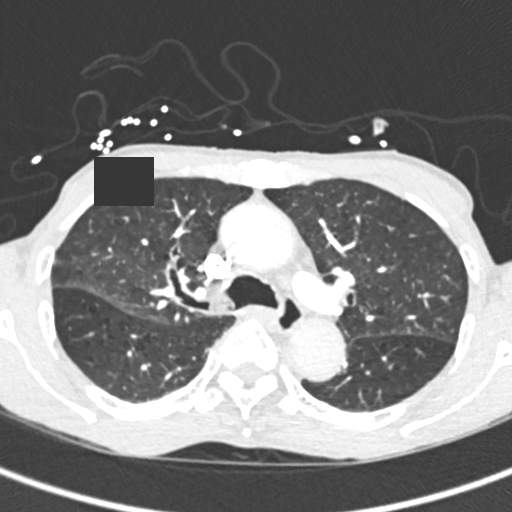
[im 185/254  soft-tissue]
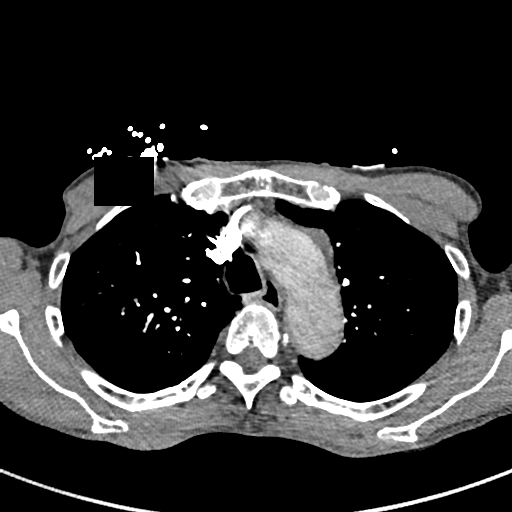
[im 196/254  lung]
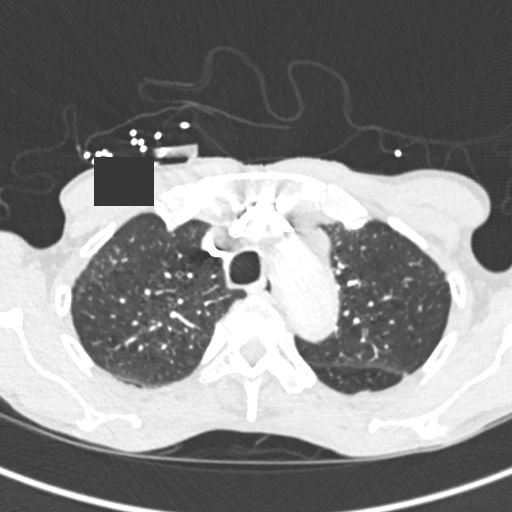
[im 208/254  soft-tissue]
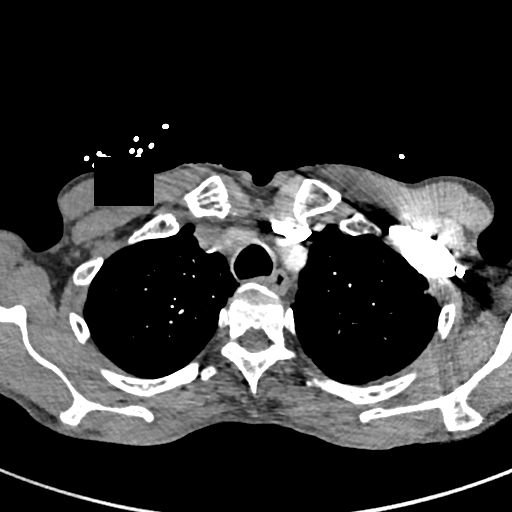
[im 231/254  lung]
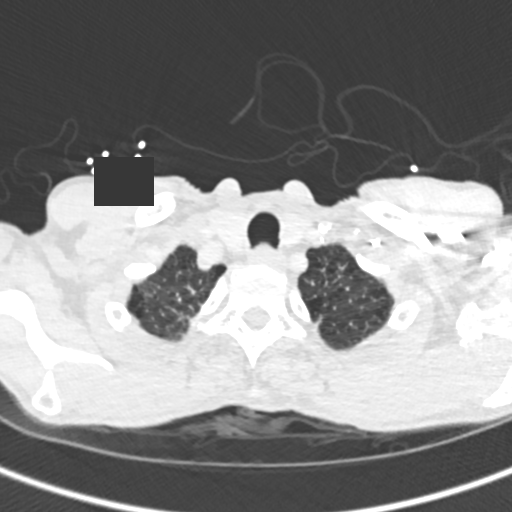
[im 242/254  soft-tissue]
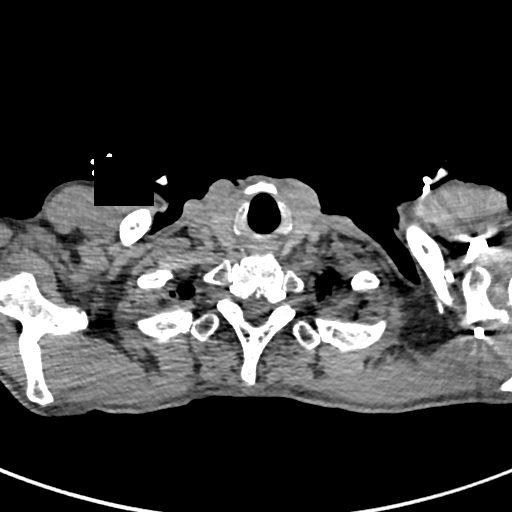

[Series 7: coronal mpr · coronal · 0.54mm/px · 3 of 89 slices shown]
[im 23/89  soft-tissue]
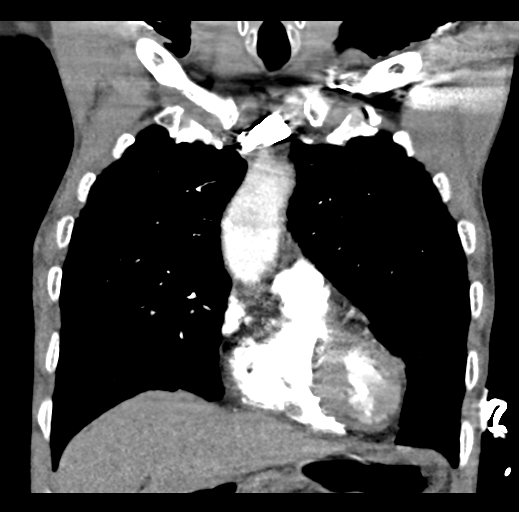
[im 45/89  soft-tissue]
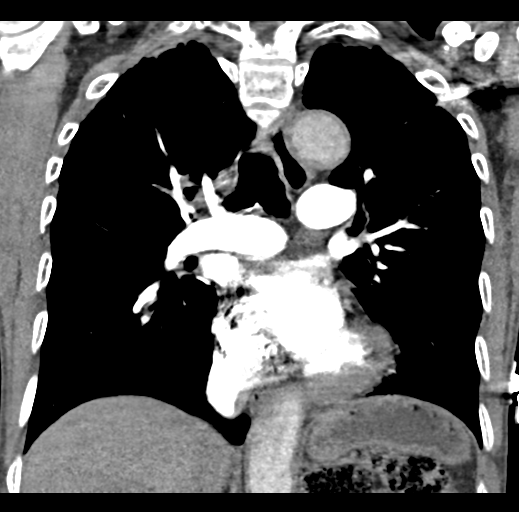
[im 67/89  soft-tissue]
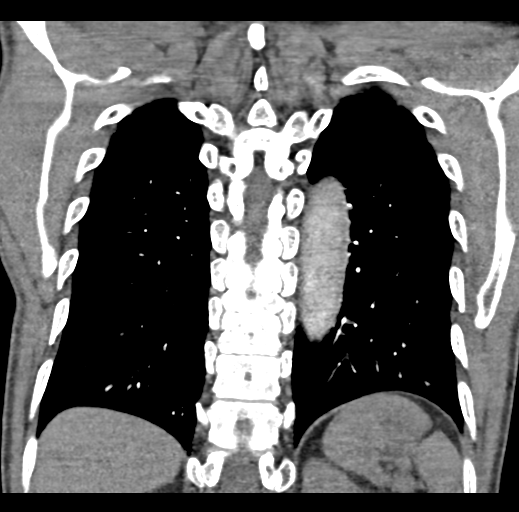

[19 of 46 positions shown; findings below may reference images not displayed]

FINDINGS: There are no filling defects in the pulmonary arterial tree to
suggest acute pulmonary thromboembolism.

9 mm prevascular node.

Normal thyroid gland.

Interstitial changes within the right lung are stable.

No acute bony deformity.

Stable appearance of the liver.

Review of the MIP images confirms the above findings.
IMPRESSION: No evidence of acute pulmonary thromboembolism.
